# Patient Record
Sex: Female | Born: 1950 | Race: White | Hispanic: No | Marital: Married | Smoking: Never smoker
Health system: Southern US, Community
[De-identification: ages and names within clinical notes are randomized; demographics above are authoritative.]

## PROBLEM LIST (undated history)

## (undated) DIAGNOSIS — M79604 Pain in right leg: Secondary | ICD-10-CM

## (undated) DIAGNOSIS — M545 Low back pain, unspecified: Secondary | ICD-10-CM

## (undated) DIAGNOSIS — M79605 Pain in left leg: Secondary | ICD-10-CM

## (undated) DIAGNOSIS — H269 Unspecified cataract: Secondary | ICD-10-CM

## (undated) DIAGNOSIS — C801 Malignant (primary) neoplasm, unspecified: Secondary | ICD-10-CM

## (undated) DIAGNOSIS — I471 Supraventricular tachycardia, unspecified: Secondary | ICD-10-CM

## (undated) DIAGNOSIS — K219 Gastro-esophageal reflux disease without esophagitis: Secondary | ICD-10-CM

## (undated) DIAGNOSIS — T7840XA Allergy, unspecified, initial encounter: Secondary | ICD-10-CM

## (undated) DIAGNOSIS — M1990 Unspecified osteoarthritis, unspecified site: Secondary | ICD-10-CM

## (undated) DIAGNOSIS — F419 Anxiety disorder, unspecified: Secondary | ICD-10-CM

## (undated) HISTORY — DX: Gastro-esophageal reflux disease without esophagitis: K21.9

## (undated) HISTORY — DX: Unspecified osteoarthritis, unspecified site: M19.90

## (undated) HISTORY — DX: Pain in right leg: M79.604

## (undated) HISTORY — PX: UPPER GASTROINTESTINAL ENDOSCOPY: SHX188

## (undated) HISTORY — PX: COLONOSCOPY: SHX174

## (undated) HISTORY — PX: OTHER SURGICAL HISTORY: SHX169

## (undated) HISTORY — DX: Anxiety disorder, unspecified: F41.9

## (undated) HISTORY — DX: Pain in left leg: M79.605

## (undated) HISTORY — DX: Malignant (primary) neoplasm, unspecified: C80.1

## (undated) HISTORY — DX: Low back pain, unspecified: M54.50

## (undated) HISTORY — DX: Supraventricular tachycardia, unspecified: I47.10

## (undated) HISTORY — DX: Allergy, unspecified, initial encounter: T78.40XA

## (undated) HISTORY — DX: Low back pain: M54.5

## (undated) HISTORY — DX: Unspecified cataract: H26.9

## (undated) HISTORY — DX: Supraventricular tachycardia: I47.1

---

## 1996-11-09 HISTORY — PX: VAGINAL HYSTERECTOMY: SUR661

## 2002-08-07 ENCOUNTER — Encounter: Payer: Self-pay | Admitting: Family Medicine

## 2002-08-07 ENCOUNTER — Encounter: Admission: RE | Admit: 2002-08-07 | Discharge: 2002-08-07 | Payer: Self-pay | Admitting: Family Medicine

## 2006-07-06 ENCOUNTER — Encounter: Admission: RE | Admit: 2006-07-06 | Discharge: 2006-07-06 | Payer: Self-pay | Admitting: Family Medicine

## 2006-07-14 ENCOUNTER — Ambulatory Visit: Payer: Self-pay

## 2006-07-14 ENCOUNTER — Encounter: Payer: Self-pay | Admitting: Cardiology

## 2006-07-23 ENCOUNTER — Ambulatory Visit: Payer: Self-pay | Admitting: Internal Medicine

## 2006-08-05 ENCOUNTER — Ambulatory Visit: Payer: Self-pay | Admitting: Internal Medicine

## 2006-08-05 ENCOUNTER — Encounter: Payer: Self-pay | Admitting: Internal Medicine

## 2008-11-16 ENCOUNTER — Ambulatory Visit: Payer: Self-pay | Admitting: Family Medicine

## 2008-11-16 DIAGNOSIS — J309 Allergic rhinitis, unspecified: Secondary | ICD-10-CM | POA: Insufficient documentation

## 2008-11-16 DIAGNOSIS — F411 Generalized anxiety disorder: Secondary | ICD-10-CM | POA: Insufficient documentation

## 2008-11-19 ENCOUNTER — Emergency Department (HOSPITAL_COMMUNITY): Admission: EM | Admit: 2008-11-19 | Discharge: 2008-11-20 | Payer: Self-pay | Admitting: Emergency Medicine

## 2008-12-21 ENCOUNTER — Ambulatory Visit: Payer: Self-pay | Admitting: Family Medicine

## 2008-12-21 LAB — CONVERTED CEMR LAB
ALT: 26 units/L (ref 0–35)
AST: 22 units/L (ref 0–37)
Albumin: 3.8 g/dL (ref 3.5–5.2)
Alkaline Phosphatase: 46 units/L (ref 39–117)
BUN: 12 mg/dL (ref 6–23)
Basophils Absolute: 0 10*3/uL (ref 0.0–0.1)
Basophils Relative: 0.3 % (ref 0.0–3.0)
Bilirubin Urine: NEGATIVE
Bilirubin, Direct: 0.1 mg/dL (ref 0.0–0.3)
CO2: 31 meq/L (ref 19–32)
Calcium: 9.3 mg/dL (ref 8.4–10.5)
Chloride: 102 meq/L (ref 96–112)
Cholesterol: 219 mg/dL (ref 0–200)
Creatinine, Ser: 0.8 mg/dL (ref 0.4–1.2)
Direct LDL: 122.7 mg/dL
Eosinophils Absolute: 0.2 10*3/uL (ref 0.0–0.7)
Eosinophils Relative: 3.8 % (ref 0.0–5.0)
GFR calc Af Amer: 95 mL/min
GFR calc non Af Amer: 79 mL/min
Glucose, Bld: 107 mg/dL — ABNORMAL HIGH (ref 70–99)
Glucose, Urine, Semiquant: NEGATIVE
HCT: 37.6 % (ref 36.0–46.0)
HDL: 57.3 mg/dL (ref >39.0)
Hemoglobin: 13.1 g/dL (ref 12.0–15.0)
Ketones, urine, test strip: NEGATIVE
Lymphocytes Relative: 49.3 % — ABNORMAL HIGH (ref 12.0–46.0)
MCHC: 34.8 g/dL (ref 30.0–36.0)
MCV: 92.6 fL (ref 78.0–100.0)
Monocytes Absolute: 0.4 10*3/uL (ref 0.1–1.0)
Monocytes Relative: 7.4 % (ref 3.0–12.0)
Neutro Abs: 2.3 10*3/uL (ref 1.4–7.7)
Neutrophils Relative %: 39.2 % — ABNORMAL LOW (ref 43.0–77.0)
Nitrite: NEGATIVE
Platelets: 282 10*3/uL (ref 150–400)
Potassium: 4.1 meq/L (ref 3.5–5.1)
RBC: 4.06 M/uL (ref 3.87–5.11)
RDW: 12 % (ref 11.5–14.6)
Sodium: 140 meq/L (ref 135–145)
Specific Gravity, Urine: 1.015
TSH: 1.19 microintl units/mL (ref 0.35–5.50)
Total Bilirubin: 0.9 mg/dL (ref 0.3–1.2)
Total CHOL/HDL Ratio: 3.8
Total Protein: 6.9 g/dL (ref 6.0–8.3)
Triglycerides: 205 mg/dL (ref 0–149)
Urobilinogen, UA: 0.2
VLDL: 41 mg/dL — ABNORMAL HIGH (ref 0–40)
WBC Urine, dipstick: NEGATIVE
WBC: 5.9 10*3/uL (ref 4.5–10.5)
pH: 8

## 2008-12-28 ENCOUNTER — Ambulatory Visit: Payer: Self-pay | Admitting: Family Medicine

## 2009-02-26 ENCOUNTER — Ambulatory Visit: Payer: Self-pay | Admitting: Family Medicine

## 2009-03-07 ENCOUNTER — Telehealth: Payer: Self-pay | Admitting: Family Medicine

## 2009-03-22 ENCOUNTER — Telehealth: Payer: Self-pay | Admitting: Speech Pathology

## 2009-06-06 ENCOUNTER — Telehealth: Payer: Self-pay | Admitting: Internal Medicine

## 2009-06-17 ENCOUNTER — Ambulatory Visit: Payer: Self-pay | Admitting: Gastroenterology

## 2009-06-17 ENCOUNTER — Telehealth: Payer: Self-pay | Admitting: Internal Medicine

## 2009-06-17 DIAGNOSIS — R1319 Other dysphagia: Secondary | ICD-10-CM | POA: Insufficient documentation

## 2009-06-18 ENCOUNTER — Ambulatory Visit: Payer: Self-pay | Admitting: Gastroenterology

## 2009-06-20 ENCOUNTER — Telehealth: Payer: Self-pay | Admitting: Internal Medicine

## 2009-06-21 ENCOUNTER — Ambulatory Visit: Payer: Self-pay | Admitting: Family Medicine

## 2009-06-21 ENCOUNTER — Ambulatory Visit: Payer: Self-pay | Admitting: Gastroenterology

## 2009-06-25 ENCOUNTER — Ambulatory Visit (HOSPITAL_COMMUNITY): Admission: RE | Admit: 2009-06-25 | Discharge: 2009-06-25 | Payer: Self-pay | Admitting: Gastroenterology

## 2009-07-01 ENCOUNTER — Telehealth: Payer: Self-pay | Admitting: Internal Medicine

## 2009-07-02 ENCOUNTER — Encounter: Payer: Self-pay | Admitting: Internal Medicine

## 2009-07-19 ENCOUNTER — Ambulatory Visit: Payer: Self-pay | Admitting: Internal Medicine

## 2009-07-19 DIAGNOSIS — F410 Panic disorder [episodic paroxysmal anxiety] without agoraphobia: Secondary | ICD-10-CM | POA: Insufficient documentation

## 2009-07-29 ENCOUNTER — Telehealth: Payer: Self-pay | Admitting: Family Medicine

## 2009-08-02 ENCOUNTER — Telehealth: Payer: Self-pay | Admitting: Internal Medicine

## 2009-08-08 ENCOUNTER — Telehealth: Payer: Self-pay | Admitting: Internal Medicine

## 2009-08-09 ENCOUNTER — Telehealth: Payer: Self-pay | Admitting: Internal Medicine

## 2009-08-09 ENCOUNTER — Emergency Department (HOSPITAL_COMMUNITY): Admission: EM | Admit: 2009-08-09 | Discharge: 2009-08-09 | Payer: Self-pay | Admitting: Emergency Medicine

## 2009-08-09 ENCOUNTER — Telehealth: Payer: Self-pay | Admitting: Family Medicine

## 2009-08-12 ENCOUNTER — Telehealth: Payer: Self-pay | Admitting: Internal Medicine

## 2009-09-04 ENCOUNTER — Encounter: Payer: Self-pay | Admitting: Family Medicine

## 2010-01-09 ENCOUNTER — Ambulatory Visit: Payer: Self-pay | Admitting: Family Medicine

## 2010-01-09 DIAGNOSIS — M542 Cervicalgia: Secondary | ICD-10-CM | POA: Insufficient documentation

## 2010-01-13 ENCOUNTER — Encounter: Payer: Self-pay | Admitting: Family Medicine

## 2010-01-13 ENCOUNTER — Ambulatory Visit: Payer: Self-pay | Admitting: Internal Medicine

## 2010-01-14 ENCOUNTER — Ambulatory Visit: Payer: Self-pay | Admitting: Family Medicine

## 2010-01-14 LAB — CONVERTED CEMR LAB
Bilirubin Urine: NEGATIVE
Glucose, Urine, Semiquant: NEGATIVE
Ketones, urine, test strip: NEGATIVE
Nitrite: NEGATIVE
Protein, U semiquant: NEGATIVE
Specific Gravity, Urine: <1.005
Urobilinogen, UA: 0.2
WBC Urine, dipstick: NEGATIVE
pH: 6.5

## 2010-01-21 ENCOUNTER — Telehealth: Payer: Self-pay | Admitting: Family Medicine

## 2010-09-08 ENCOUNTER — Encounter: Payer: Self-pay | Admitting: Family Medicine

## 2010-12-07 LAB — CONVERTED CEMR LAB
ALT: 23 units/L (ref 0–35)
AST: 22 units/L (ref 0–37)
Albumin: 4.1 g/dL (ref 3.5–5.2)
Alkaline Phosphatase: 54 units/L (ref 39–117)
BUN: 11 mg/dL (ref 6–23)
Basophils Absolute: 0.1 10*3/uL (ref 0.0–0.1)
Basophils Relative: 1.1 % (ref 0.0–3.0)
Bilirubin Urine: NEGATIVE
Bilirubin, Direct: 0.1 mg/dL (ref 0.0–0.3)
CO2: 31 meq/L (ref 19–32)
Calcium: 9.3 mg/dL (ref 8.4–10.5)
Chloride: 103 meq/L (ref 96–112)
Cholesterol: 235 mg/dL — ABNORMAL HIGH (ref 0–200)
Creatinine, Ser: 0.9 mg/dL (ref 0.4–1.2)
Direct LDL: 130.2 mg/dL
Eosinophils Absolute: 0.1 10*3/uL (ref 0.0–0.7)
Eosinophils Relative: 0.9 % (ref 0.0–5.0)
GFR calc non Af Amer: 68.57 mL/min (ref >60)
Glucose, Bld: 113 mg/dL — ABNORMAL HIGH (ref 70–99)
Glucose, Urine, Semiquant: NEGATIVE
HCT: 40.2 % (ref 36.0–46.0)
HDL: 91.5 mg/dL (ref >39.00)
Hemoglobin: 13.2 g/dL (ref 12.0–15.0)
Ketones, urine, test strip: NEGATIVE
Lymphocytes Relative: 22.8 % (ref 12.0–46.0)
Lymphs Abs: 1.6 10*3/uL (ref 0.7–4.0)
MCHC: 32.9 g/dL (ref 30.0–36.0)
MCV: 96.7 fL (ref 78.0–100.0)
Monocytes Absolute: 0.5 10*3/uL (ref 0.1–1.0)
Monocytes Relative: 6.6 % (ref 3.0–12.0)
Neutro Abs: 4.6 10*3/uL (ref 1.4–7.7)
Neutrophils Relative %: 68.6 % (ref 43.0–77.0)
Nitrite: NEGATIVE
Platelets: 325 10*3/uL (ref 150.0–400.0)
Potassium: 3.5 meq/L (ref 3.5–5.1)
Protein, U semiquant: NEGATIVE
RBC: 4.16 M/uL (ref 3.87–5.11)
RDW: 12.6 % (ref 11.5–14.6)
Sodium: 141 meq/L (ref 135–145)
Specific Gravity, Urine: 1.02
TSH: 1.37 microintl units/mL (ref 0.35–5.50)
Total Bilirubin: 0.5 mg/dL (ref 0.3–1.2)
Total CHOL/HDL Ratio: 3
Total Protein: 7.6 g/dL (ref 6.0–8.3)
Triglycerides: 148 mg/dL (ref 0.0–149.0)
Urobilinogen, UA: 0.2
VLDL: 29.6 mg/dL (ref 0.0–40.0)
WBC Urine, dipstick: NEGATIVE
WBC: 6.9 10*3/uL (ref 4.5–10.5)
pH: 6.5

## 2010-12-09 NOTE — Miscellaneous (Signed)
Summary: BONE DENSITY  Clinical Lists Changes  Orders: Added new Test order of T-Bone Densitometry (77080) - Signed Added new Test order of T-Lumbar Vertebral Assessment (77082) - Signed 

## 2010-12-09 NOTE — Miscellaneous (Signed)
Summary: Manometry cancelled and Rx  Saw her at hospital and given improvement will hold off on manometry.  Will continue alprazolam, use pantoprazole.  She has REV in SeptemberPrescriptions: Prescriptions: ALPRAZOLAM 0.25 MG TABS (ALPRAZOLAM) Take 1 tab 3 times daily as needed  #60 x 1   Entered and Authorized by:   Iva Boop MD, [facility]   Signed by:   Iva Boop MD, [facility] on 07/02/2009   Method used:   Print then Give to Patient   RxID:   [id number] PANTOPRAZOLE SODIUM 40 MG  TBEC (PANTOPRAZOLE SODIUM) 1 each day 30 minutes before meal  #30 x 1   Entered and Authorized by:   Iva Boop MD, [facility]   Signed by:   Iva Boop MD, [facility] on 07/02/2009   Method used:   Print then Give to Patient   RxID:   [id number]

## 2010-12-09 NOTE — Assessment & Plan Note (Signed)
Summary: cpx/pt coming in fasting/cjr   Vital Signs:  Patient profile:   59 year old female Menstrual status:  hysterectomy Height:      66 inches Weight:      160 pounds Temp:     98.9 degrees F oral  Vitals Entered By: Kern Reap CMA Duncan Dull) (January 09, 2010 9:28 AM)  Reason for Visit cpx  Primary Care Provider:  Kelle Darting, MD   History of Present Illness: Ellen Collins is a 60 year old, married female, nonsmoker, who comes in today for a physical evaluation.  She takes hormone replacement therapy via her GYN.  She is aware of the long-term risks but wishes to continue.  I will leave that up to her GYN.  She does have a history of allergic rhinitis, for which he takes Claritin, 10 mg daily Astelin nasal spray, and steroid nasal spray.  She would like refills.  For sleep dysfunction.  She takes 5 mg of Ambien nightly p.r.n.  Last year.  She had a lot of trouble with spells of anxiety.  GI workup negative.  Psychiatric consult with Dr. Nolen Mu revealed panic attacks.  She's been on for anti-depressants long-term and does not feel that they helped and she also has side effects.  Dr. Nolen Mu has decided to treat her episodically with Xanax .25 p.r.n.  This seems to be working well.  A new problem is neck pain.  Over the last couple, months.  She's been having neck pain.  She, states it's dull sometimes sharp it comes and goes.  She denies any neurologic symptoms.  She was having severe discomfort and therefore, went to an urgent care center.  She was sent for an MRI, which showed no evidence of a surgical intervention.  Despite that, they sent her for neurosurgery consult.  I don't feel like she needs it at this time.  We will treat her symptomatically.  She gets routine eye care.  Dental care.  Monthly BSE, annual mammography.  Colonoscopy, normal, and GI tetanus 2007, seasonal flu 2010.  She's had a TAH and BSO for nonmalignant reasons.  Mammogram October normal.  Last BMD 5 years  ago.  Allergies: 1)  ! * Latex  Past History:  Past medical, surgical, family and social histories (including risk factors) reviewed, and no changes noted (except as noted below).  Past Medical History: Reviewed history from 07/19/2009 and no changes required. Allergic rhinitis Anxiety/Panic disorder (dysphagia)  Past Surgical History: Reviewed history from 11/16/2008 and no changes required. Hysterectomy Oophorectomy  nonmalignant  Family History: Reviewed history from 11/16/2008 and no changes required. Father: deceased - colon cancer Mother: deceased - ovarian cancer Siblings: brother - healthy  Social History: Reviewed history from 06/17/2009 and no changes required. Occupation:stay at home Married  No children Regular exercise-yes Patient has never smoked.  Alcohol Use - yes   2 per week Illicit Drug Use - no  Review of Systems      See HPI  Physical Exam  General:  Well-developed,well-nourished,in no acute distress; alert,appropriate and cooperative throughout examination Head:  Normocephalic and atraumatic without obvious abnormalities. No apparent alopecia or balding. Eyes:  No corneal or conjunctival inflammation noted. EOMI. Perrla. Funduscopic exam benign, without hemorrhages, exudates or papilledema. Vision grossly normal. Ears:  External ear exam shows no significant lesions or deformities.  Otoscopic examination reveals clear canals, tympanic membranes are intact bilaterally without bulging, retraction, inflammation or discharge. Hearing is grossly normal bilaterally. Nose:  External nasal examination shows no deformity or inflammation.  Nasal mucosa are pink and moist without lesions or exudates. Mouth:  Oral mucosa and oropharynx without lesions or exudates.  Teeth in good repair. Neck:  No deformities, masses, or tenderness noted. Chest Wall:  No deformities, masses, or tenderness noted. Breasts:  No mass, nodules, thickening, tenderness, bulging,  retraction, inflamation, nipple discharge or skin changes noted.   Lungs:  Normal respiratory effort, chest expands symmetrically. Lungs are clear to auscultation, no crackles or wheezes. Heart:  Normal rate and regular rhythm. S1 and S2 normal without gallop, murmur, click, rub or other extra sounds. Abdomen:  Bowel sounds positive,abdomen soft and non-tender without masses, organomegaly or hernias noted. Rectal:  gyn  Genitalia:  gyn Msk:  No deformity or scoliosis noted of thoracic or lumbar spine.   Pulses:  R and L carotid,radial,femoral,dorsalis pedis and posterior tibial pulses are full and equal bilaterally Extremities:  No clubbing, cyanosis, edema, or deformity noted with normal full range of motion of all joints.   Neurologic:  No cranial nerve deficits noted. Station and gait are normal. Plantar reflexes are down-going bilaterally. DTRs are symmetrical throughout. Sensory, motor and coordinative functions appear intact. Skin:  Intact without suspicious lesions or rashes Cervical Nodes:  No lymphadenopathy noted Axillary Nodes:  No palpable lymphadenopathy Inguinal Nodes:  No significant adenopathy Psych:  Cognition and judgment appear intact. Alert and cooperative with normal attention span and concentration. 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 1/2 tab daily x 1 week then 1 tab daily Her updated medication list for this problem includes:    Alprazolam 0.25 Mg Tabs (Alprazolam) .Marland Kitchen... Take 1 tab 3 times daily as needed    Amitriptyline Hcl 25 Mg Tabs (Amitriptyline hcl) .Marland Kitchen... 1 tab @ bedtime  Orders: Venipuncture (09811) TLB-Lipid Panel (80061-LIPID) TLB-BMP (Basic Metabolic Panel-BMET) (80048-METABOL) TLB-CBC Platelet - w/Differential  (85025-CBCD) TLB-Hepatic/Liver Function Pnl (80076-HEPATIC) TLB-TSH (Thyroid Stimulating Hormone) (91478-GNF) Prescription Created Electronically [phone]) EKG w/ Interpretation (93000) UA Dipstick w/o Micro (automated)  (81003)  Problem # 2:  ALLERGIC RHINITIS (ICD-477.9) Assessment: Improved  The following medications were removed from the medication list:    Astelin 137 Mcg/spray Soln (Azelastine hcl) .Marland Kitchen... As needed    Nasonex 50 Mcg/act Susp (Mometasone furoate) .Marland Kitchen... As needed    Promethazine Hcl 25 Mg Supp (Promethazine hcl) .Marland Kitchen..Marland Kitchen 1 rectally q 8 hr. as needed nausea Her updated medication list for this problem includes:    Alavert 10 Mg Tabs (Loratadine) ..... Once daily    Astelin 137 Mcg/spray Soln (Azelastine hcl) ..... Uad    Flonase 50 Mcg/act Susp (Fluticasone propionate) ..... Uad  Orders: Venipuncture (86578) TLB-Lipid Panel (80061-LIPID) TLB-BMP (Basic Metabolic Panel-BMET) (80048-METABOL) TLB-CBC Platelet - w/Differential (85025-CBCD) TLB-Hepatic/Liver Function Pnl (80076-HEPATIC) TLB-TSH (Thyroid Stimulating Hormone) (46962-XBM) Prescription Created Electronically [phone]) UA Dipstick w/o Micro (automated)  (81003)  Problem # 3:  Preventive Health Care (ICD-V70.0) Assessment: Unchanged  Problem # 4:  NECK PAIN (ICD-723.1) Assessment: New  Her updated medication list for this problem includes:    Aspirin 81 Mg Tbec (Aspirin) .Marland Kitchen... Take one by mouth twice a week    Adult Aspirin Ec Low Strength 81 Mg Tbec (Aspirin) ..... Once twice a wk  Orders: Venipuncture (44010) TLB-Lipid Panel (80061-LIPID) TLB-BMP (Basic Metabolic Panel-BMET) (80048-METABOL) TLB-CBC Platelet - w/Differential (85025-CBCD) TLB-Hepatic/Liver Function Pnl (80076-HEPATIC) TLB-TSH (Thyroid Stimulating  Hormone) (84443-TSH) UA Dipstick w/o Micro (automated)  (81003) Physical Therapy Referral (PT)  Complete Medication List: 1)  Estrace 2 Mg Tabs (Estradiol) .... Take one tab every other day  and 1/2 tablet by mouth every other day 2)  Alavert 10 Mg Tabs (Loratadine) .... Once daily 3)  Ambien 5 Mg Tabs (Zolpidem tartrate) .... Take 1/2 tablet by mouth at bedtime 4)  Viactiv Multi-vitamin Chew (Multiple vitamins-calcium) .... Chew one by mouth two times a day 5)  Pantoprazole Sodium 40 Mg Tbec (Pantoprazole sodium) .Marland Kitchen.. 1 each day 30 minutes before meal 6)  Alprazolam 0.25 Mg Tabs (Alprazolam) .... Take 1 tab 3 times daily as needed 7)  Aspirin 81 Mg Tbec (Aspirin) .... Take one by mouth twice a week 8)  Vitamin D 1000 Unit Tabs (Cholecalciferol) .... Once a day 9)  Adult Aspirin Ec Low Strength 81 Mg Tbec (Aspirin) .... Once twice a wk 10)  Mega Red  .... Once daily 11)  Amitriptyline Hcl 25 Mg Tabs (Amitriptyline hcl) .Marland Kitchen.. 1 tab @ bedtime 12)  Astelin 137 Mcg/spray Soln (Azelastine hcl) .... Uad 13)  Flonase 50 Mcg/act Susp (Fluticasone propionate) .... Uad  Other Orders: T-Bone Densitometry [phone])  Patient Instructions: 1)  take Motrin, 600 mg twice a day with food, wear a soft cervical collar, nightly, and we will be to set up for physical therapy.  I would cancel the neurosurgical appointment. 2)  Please schedule a follow-up appointment in 1 year. 3)  Schedule your mammogram. 4)  Schedule a colonoscopy/sigmoidoscopy to help detect colon cancer. 5)  Take an Aspirin every day. Prescriptions: FLONASE 50 MCG/ACT SUSP (FLUTICASONE PROPIONATE) UAD  #3 x 4   Entered and Authorized by:   Roderick Pee MD   Signed by:   Roderick Pee MD on 01/09/2010   Method used:   Electronically to        [facility]  [phone]* (retail)       [address] [place], [place]  [address]       Ph: [phone] or [phone]       Fax: [phone]   RxID:   [id number] ASTELIN 137 MCG/SPRAY SOLN (AZELASTINE HCL) UAD  #3 x 4   Entered and Authorized by:   Roderick Pee MD   Signed by:   Roderick Pee MD on 01/09/2010   Method used:   Print then Give to Patient   RxID:    [id number] ALPRAZOLAM 0.25 MG TABS (ALPRAZOLAM) Take 1 tab 3 times daily as needed  #60 x 3   Entered and Authorized by:   Roderick Pee MD   Signed by:   Roderick Pee MD on 01/09/2010   Method used:   Print then Give to Patient   RxID:   [id number] AMBIEN 5 MG TABS (ZOLPIDEM TARTRATE) take 1/2 tablet by mouth at bedtime  #30 x 2   Entered and Authorized by:   Roderick Pee MD   Signed by:   Roderick Pee MD on 01/09/2010   Method used:   Print then Give to Patient   RxID:   [id number] AMITRIPTYLINE HCL 25 MG TABS (AMITRIPTYLINE HCL) 1 tab @ bedtime  #100 x 3   Entered and Authorized by:   Roderick Pee MD   Signed by:   Roderick Pee MD on 01/09/2010   Method used:   [facility]        [facility]  [address]  [phone]* (retail)       [address] [place], [place]  [address]       Ph: [phone] or [phone]       Fax: [phone]   RxID:   [id number]     Laboratory Results   Urine Tests    Routine Urinalysis   Color: yellow Appearance: Clear Glucose: negative   (Normal Range: Negative) Bilirubin: negative   (Normal Range: Negative) Ketone: negative   (Normal Range: Negative) Spec. Gravity: 1.020   (Normal Range: 1.003-1.035) Blood: 1+   (Normal Range: Negative) pH: 6.5   (Normal Range: 5.0-8.0) Protein: negative   (Normal Range: Negative) Urobilinogen: 0.2   (Normal Range: 0-1) Nitrite: negative   (Normal Range: Negative) Leukocyte Esterace: negative   (Normal Range: Negative)    Comments: Rita Ohara  January 09, 2010 2:16 PM

## 2010-12-09 NOTE — Assessment & Plan Note (Signed)
Summary: DYSPHAGIA, PANIC D/O   History of Present Illness Visit Type: Follow-up Visit Primary GI MD: Stan Head MD [facility] Primary Provider: Kelle Darting, MD Chief Complaint: Follow-up visit from office visit with Amy, patient states that she is 85% better. Medication is working great and she is a different person. If she feels she is going to have a problem she takes a Alprazolam. History of Present Illness:   "I feel 100% better, the medicine is working. Occasional tightening of throat but able to eat out again. Using alprazolam 1-3/day. She tells how it has been a struggle to try to stop estradiol therapy. Multiple hot flashes, alopecia, irritability. Buproprion had been for that. She has been on it since a prohylacticv hysterectomy/BSO years ago.           Current Medications (verified): 1)  Estrace 2 Mg Tabs (Estradiol) .... Take One Tab Every Other Day and 1/2 Tablet By Mouth Every Other Day 2)  Alavert 10 Mg Tabs (Loratadine) .... Once Daily 3)  Ambien 5 Mg Tabs (Zolpidem Tartrate) .... Take 1/2 Tablet By Mouth At Bedtime 4)  Viactiv Multi-Vitamin  Chew (Multiple Vitamins-Calcium) .... Chew One By Mouth Two Times A Day 5)  Pantoprazole Sodium 40 Mg  Tbec (Pantoprazole Sodium) .Marland Kitchen.. 1 Each Day 30 Minutes Before Meal 6)  Astelin 137 Mcg/spray Soln (Azelastine Hcl) .... As Needed 7)  Nasonex 50 Mcg/act Susp (Mometasone Furoate) .... As Needed 8)  Alprazolam 0.25 Mg Tabs (Alprazolam) .... Take 1 Tab 3 Times Daily As Needed 9)  Aspirin 81 Mg Tbec (Aspirin) .... Take One By Mouth Twice A Week  Allergies (verified): 1)  ! * Latex  Past History:  Past Surgical History: Last updated: 11/16/2008 Hysterectomy Oophorectomy  nonmalignant  Past Medical History: Allergic rhinitis Anxiety/Panic disorder (dysphagia)  Family History: Reviewed history from 11/16/2008 and no changes required. Father: deceased - colon cancer Mother: deceased - ovarian cancer Siblings: brother -  healthy  Social History: Reviewed history from 06/17/2009 and no changes required. Occupation:stay at home Married  No children Regular exercise-yes Patient has never smoked.  Alcohol Use - yes   2 per week Illicit Drug Use - no  Vital Signs:  Patient profile:   60 year old female Height:      62.25 inches Weight:      158.0 pounds Pulse rate:   100 / minute Pulse rhythm:   regular BP sitting:   130 / 78  (left arm) Cuff size:   regular  Vitals Entered By: Harlow Mares CMA Duncan Dull) (July 19, 2009 11:49 AM)  Physical Exam  General:  Well developed, well nourished, no acute distress.   Impression & Recommendations:  Problem # 1:  DYSPHAGIA (LKG-401.02) Assessment Improved This has responded to pantoprazole and alprazolam (mostly the latter). I believe it is related to panic disorder. See that dx.  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  Approx 15-20 mins spent discussing this and treatment that could include psychotherapy and/or medication. ? if some relationship to attempts in withdrawing estradiol. Will start citalopram, continue alprazolam and follow-up in 2 months. Goal would be to minimize alprazolam use if possible. Citalopram may help estradiol withdrawal also. Side effects discussed. Anxiety/panic handout provided.  Patient Instructions: 1)  Please pick up your medications at your pharmacy. 2)  You will start citalopram for panic disturbance. 3)  Your pantoprazole and alprazolam have been refilled. 4)  Please schedule a follow-up appointment in 2 months.  5)  The medication list was reviewed and reconciled.  All changed / newly prescribed medications were explained.  A complete medication list was provided to the patient / caregiver. Prescriptions: ALPRAZOLAM 0.25 MG TABS (ALPRAZOLAM) Take 1 tab 3 times daily as needed  #60 x 3   Entered and  Authorized by:   Iva Boop MD, [facility]   Signed by:   Iva Boop MD, FACG on 07/19/2009   Method used:   Printed then faxed to ...       [facility]  [phone]* (retail)       [address] [place], [place]  [address]       Ph: [phone] or [phone]       Fax: [phone]   RxID:   [id number] PANTOPRAZOLE SODIUM 40 MG  TBEC (PANTOPRAZOLE SODIUM) 1 each day 30 minutes before meal  #30 x 11   Entered and Authorized by:   Iva Boop MD, [facility]   Signed by:   Iva Boop MD, [facility] on 07/19/2009   Method used:   [facility]ron[facility] to        [facility]  [phone]* (retail)       [address] [place], [place]  [address]       Ph: [phone] or [phone]       Fax: [phone]   RxID:   [id number] CITALOPRAM HYDROBROMIDE 20 MG TABS (CITALOPRAM HYDROBROMIDE) 1/2 tab daily x 1 week then 1 tab daily  #30 x 3   Entered and Authorized by:   Iva Boop MD, [facility]   Signed by:   Iva Boop MD, FACG on 07/19/2009   Method used:   [facility]        [facility]  [phone]* (retail)       [address] [place], [place]  [address]       Ph: [phone] or [phone]       Fax: [phone]   RxID:   [id number]

## 2010-12-09 NOTE — Procedures (Signed)
Summary: Colonoscopy and Path Report:: NORMAL (no adenomas)  Patient Name: Ellen Collins, Ellen Collins MRN:  Procedure Procedures: Colonoscopy CPT: [phone].    with polypectomy. CPT: [id number].  Personnel: Endoscopist: Iva Boop, MD, [facility].  Referred By: Mosetta Putt, M.D.  Exam Location: Exam performed in Outpatient Clinic. Outpatient  Patient Consent: Procedure, Alternatives, Risks and Benefits discussed, consent obtained, from patient. Consent was obtained by the RN.  Indications  Increased Risk Screening: For family history of colorectal neoplasia, in  parent age at onset: 74's.  History  Current Medications: Patient is not currently taking Coumadin.  Allergies: No known allergies. Allergic to LATEX.  Pre-Exam Physical: Performed Aug 05, 2006. Cardio-pulmonary exam, Rectal exam, HEENT exam , Abdominal exam, Mental status exam WNL.  Comments: Pt. history reviewed/updated, physical exam performed prior to initiation of sedation? YES Exam Exam: Extent of exam reached: Cecum, extent intended: Cecum.  Time to Cecum: 00:07:28. Time for Withdrawl: 00:09:58. ASA Classification: I. Tolerance: good.  Monitoring: Pulse and BP monitoring, Oximetry used. Supplemental O2 given.  Colon Prep Used MiraLax for colon prep. Prep results: fair, adequate exam.  Sedation Meds: Patient assessed and found to be appropriate for moderate (conscious) sedation. Fentanyl 75 mcg. given IV. Versed 8 mg. given IV.  Comments: Most of prep good but some mucoid stool retention in ascending colon limited exam and reduced ability to see lesions <5 mm there Findings - NORMAL EXAM: Cecum to Sigmoid Colon.  POLYP: Sigmoid Colon, Maximum size: 7 mm. sessile polyp. Distance from Anus 20 cm. Procedure:  snare with cautery, removed, retrieved, Polyp sent to pathology. 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 Comments: removed during insertion.  NORMAL EXAM: Rectum.   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   Comments: 1) 7 MM DISTAL SIGMOID POLYP REMOVED HYPERPLASTIC 2) OTHERWISE NORMAL  3) FAIR, ADEQUATE PREP (MOSTLY GOOD EXCEPT ASCENDING COLON) 4) 9 MIN 58 SEC WITHDRAWAL INSPECTION TIME Events  Unplanned Interventions: No intervention was required.  Plans  Post Exam Instructions: No aspirin or non-steroidal containing medications: 2 WEEKS.  Patient Education: Patient given standard instructions for: Polyps.  Disposition: After procedure patient sent to recovery. After recovery patient sent home.  Scheduling/Referral: Colonoscopy, to Iva Boop, MD, [facility] YRS,  Primary Care Provider, to Mosetta Putt, M.D.,    [facility] Surgical Pathology - STATUS: Final                                            Perform Date: 27Sep07 00:01  Ordered By: Jacqualine Code Date: 27Sep07 23:23  Facility: [facility]                               Department: CPATH  Service Report Text  [facility]   [address] [address]   [place], [place] [address]   Telephone [phone] or [phone] Fax [phone]    REPORT OF SURGICAL PATHOLOGY    Case #: [id number]   Patient Name: Ellen Collins, Ellen Collins.   Office Chart Number: [id number]    MRN: [id number]   Pathologist: Arvid Right, MD   DOB/Age Dec 28, 1950 (Age: 60) Gender: F   Date Taken: 08/05/2006   Date Received: 08/05/2006    FINAL DIAGNOSIS    ***MICROSCOPIC  EXAMINATION AND DIAGNOSIS***    SIGMOID COLON POLYP, 7 MM, ENDOSCOPIC BIOPSY:   - HYPERPLASTIC POLYP.   - NO ADENOMATOUS MUCOSA IDENTIFIED.    jy   Date Reported: 08/06/2006 Crystal Moore-Maxwell, MD   *** Electronically Signed Out By [facility] ***    Clinical information   Screening FX HX colon cancer [facility] Screening Study (jes)    specimen(s) obtained   Colon, polyp(s), sigmoid 7 mm    Gross Description   Received in formalin is a tan, soft tissue fragment that is   submitted in toto. Size: 0.6 cm One block    (MA:jes, 08/06/06)    jes/

## 2010-12-09 NOTE — Progress Notes (Signed)
Summary: triage  Phone Note Call from Patient Call back at Home Phone [phone]   Summary of Call: Patient states that she woke up so sick, states that she is sweating, upset stomach, severe nausea and has a burning pain right on her breast bone wants to nkow what to do Initial call taken by: Tawni Levy,  August 09, 2009 10:16 AM  Follow-up for Phone Call        Patient with constant nausea.  She c/ochest pain, blurred vision, nausea, arm pain/tingling, sweating.  Patient states that she just doesn't feel well, feels bad all the time..  Stopping her protonix has helped with her abdominal pain.  She says that all of her symptoms are listed as side effects from celexa.  I have advised patient she needs to be seen in the ER asap.  Due to the above symptoms, she verblaized understanding. Follow-up by: Darcey Nora RN, CGRN,  August 09, 2009 11:18 AM  Additional Follow-up for Phone Call Additional follow up Details #1::        Stop Celexa also Follow-up with her later today or Mon 10/4. Additional Follow-up by: Iva Boop MD, [facility],  August 09, 2009 11:57 AM    Additional Follow-up for Phone Call Additional follow up Details #2::    I have left her a message to call me on Monday with an update and to stop the celexa Follow-up by: Darcey Nora RN, CGRN,  August 09, 2009 1:32 PM

## 2010-12-09 NOTE — Progress Notes (Signed)
Summary: ? meds  Phone Note Call from Patient Call back at Home Phone [phone]   Caller: Patient Call For: Sheila Gervasi  Reason for Call: Talk to Nurse Summary of Call: manommetry sch tomorrow . pt took a Nexium yesterday needs to know what she needs to do since instructions said NOT to take any Nexium Initial call taken by: Guadlupe Spanish [facility],  July 01, 2009 9:52 AM  Follow-up for Phone Call        Patient advised that she should  proceed with the [facility] tomorrow Follow-up by: Darcey Nora RN, CGRN,  July 01, 2009 10:17 AM

## 2010-12-09 NOTE — Assessment & Plan Note (Signed)
Summary: NEW ACUTE THROAT CLOSES UP AND CANT BREATH/MHF   Vital Signs:  Patient Profile:   60 Years Old Female Height:     57.5 inches Weight:      166 pounds Temp:     98.6 degrees F oral BP sitting:   150 / 98  (left arm) Cuff size:   regular  Vitals Entered By: Kern Reap CMA (November 16, 2008 11:17 AM)                 Chief Complaint:  new patient.  History of Present Illness: Ellen Collins is a 60 year old female, who comes in today to get established as a new patient.  Her current concerns are her blood pressure.  It's 150/98.  She states her blood pressure at home has always been normal.  She takes Wellbutrin 150 mg daily for mild anxiety.  Recently, her symptoms have gotten worse because she was on jury duty.  She states she is feeling a sensation of choking in her chest.  She's also had a lot of burping, but no symptoms of gallbladder disease.  She also takes Astelin nasal spray Nasonex nasal spray and OTC Claritin for allergic rhinitis.  She states she stated her back to be re-skin tested.  I explained I do not think that was having much value.  Her last physical exam was two years ago.  she is a transfer from Dr. Madaline Brilliant office.  I explained to her that we do things a little differently, you.  We see her yearly, not every two years, and we provide comprehensive physical exams.  She can also get her pelvic exams.  Her allergy medicine.  Everything done here at once.    Updated Prior Medication List: ESTRACE 2 MG TABS (ESTRADIOL) take one tab once daily BUDEPRION SR 150 MG XR12H-TAB (BUPROPION HCL) take one tablet once daily ALAVERT 10 MG TABS (LORATADINE) once daily CLARITIN-D 12 HOUR 5-120 MG XR12H-TAB (LORATADINE-PSEUDOEPHEDRINE) once daily ASTELIN 137 MCG/SPRAY SOLN (AZELASTINE HCL) use as directed NASONEX 50 MCG/ACT SUSP (MOMETASONE FUROATE) use as directed  Current Allergies: No known allergies   Past Medical History:    Reviewed history and no changes required:        Allergic rhinitis       Anxiety  Past Surgical History:    Reviewed history and no changes required:       Hysterectomy       Oophorectomy  nonmalignant   Family History:    Reviewed history and no changes required:       Father: deceased - colon cancer       Mother: deceased - ovarian cancer       Siblings: brother - healthy  Social History:    Occupation:stay at home    Married    Regular exercise-yes    Ethnicity:  White   Risk Factors:  Exercise:  yes   Review of Systems      See HPI   Physical Exam  General:     Well-developed,well-nourished,in no acute distress; alert,appropriate and cooperative throughout examination Head:     Normocephalic and atraumatic without obvious abnormalities. No apparent alopecia or balding. Eyes:     No corneal or conjunctival inflammation noted. EOMI. Perrla. Funduscopic exam benign, without hemorrhages, exudates or papilledema. Vision grossly normal. Ears:     External ear exam shows no significant lesions or deformities.  Otoscopic examination reveals clear canals, tympanic membranes are intact bilaterally without bulging, retraction, inflammation or discharge. Hearing  is grossly normal bilaterally. Nose:     External nasal examination shows no deformity or inflammation. Nasal mucosa are pink and moist without lesions or exudates. Mouth:     Oral mucosa and oropharynx without lesions or exudates.  Teeth in good repair. Neck:     No deformities, masses, or tenderness noted. Chest Wall:     No deformities, masses, or tenderness noted. Lungs:     Normal respiratory effort, chest expands symmetrically. Lungs are clear to auscultation, no crackles or wheezes. Heart:     Normal rate and regular rhythm. S1 and S2 normal without gallop, murmur, click, rub or other extra sounds. Abdomen:     Bowel sounds positive,abdomen soft and non-tender without masses, organomegaly or hernias noted.    Impression & Recommendations:   Problem # 1:  ANXIETY (ICD-300.00) Assessment: Deteriorated  Her updated medication list for this problem includes:    Budeprion Sr 150 Mg Xr12h-tab (Bupropion hcl) .Marland Kitchen... Take one tablet once daily   Problem # 2:  ALLERGIC RHINITIS (ICD-477.9) Assessment: Unchanged  Her updated medication list for this problem includes:    Alavert 10 Mg Tabs (Loratadine) ..... Once daily    Astelin 137 Mcg/spray Soln (Azelastine hcl) ..... Use as directed    Nasonex 50 Mcg/act Susp (Mometasone furoate) ..... Use as directed   Complete Medication List: 1)  Estrace 2 Mg Tabs (Estradiol) .... Take one tab once daily 2)  Budeprion Sr 150 Mg Xr12h-tab (Bupropion hcl) .... Take one tablet once daily 3)  Alavert 10 Mg Tabs (Loratadine) .... Once daily 4)  Claritin-d 12 Hour 5-120 Mg Xr12h-tab (Loratadine-pseudoephedrine) .... Once daily 5)  Astelin 137 Mcg/spray Soln (Azelastine hcl) .... Use as directed 6)  Nasonex 50 Mcg/act Susp (Mometasone furoate) .... Use as directed 7)  Ambien 10 Mg Tabs (Zolpidem tartrate) .Marland Kitchen.. 1 tab @ bedtime  as needed   Patient Instructions: 1)  CBC, Bmet, lipid panel, urinalysis, TSH level, hepatic panel, prior to next visit (v70.0)  2)  Increase the Wellbutrin 250 mg twice a day for a couple days.  Continue your exercise program. 3)  Set up a complete physical exam sometime in the next 4 to 6 weeks.  Be sure to get fasting lab work one week at a time   Prescriptions: ESTRACE 2 MG TABS (ESTRADIOL) take one tab once daily  #100 x 3   Entered and Authorized by:   Roderick Pee MD   Signed by:   Roderick Pee MD on 11/16/2008   Method used:   Print then Give to Patient   RxID:   [id number] AMBIEN 10 MG TABS (ZOLPIDEM TARTRATE) 1 tab @ bedtime  as needed  #30 x 3   Entered and Authorized by:   Roderick Pee MD   Signed by:   Roderick Pee MD on 11/16/2008   Method used:   Print then Give to Patient   RxID:   [id number] NASONEX 50 MCG/ACT SUSP (MOMETASONE  FUROATE) use as directed  #3 x 4   Entered and Authorized by:   Roderick Pee MD   Signed by:   Roderick Pee MD on 11/16/2008   Method used:   Print then Give to Patient   RxID:   [id number] ASTELIN 137 MCG/SPRAY SOLN (AZELASTINE HCL) use as directed  #3 x 4   Entered and Authorized by:   Roderick Pee MD   Signed by:   Roderick Pee MD on 11/16/2008  Method used:   Print then Give to Patient   RxID:   [id number] BUDEPRION SR 150 MG XR12H-TAB (BUPROPION HCL) take one tablet once daily  #100 x 3   Entered and Authorized by:   Roderick Pee MD   Signed by:   Roderick Pee MD on 11/16/2008   Method used:   Print then Give to Patient   RxID:   [id number]

## 2010-12-09 NOTE — Letter (Signed)
Summary: Appt Reminder 2  [facility]  [address] [place], [place] [address]   Phone: [phone]  Fax: [phone]        June 18, 2009 MRN: [id number]    [facility] [address] [place], [place]  [address]    Dear Ms. Kiper,   You have a return appointment with Dr.Gessner on 07-17-09 at 9:30am. Please remember to bring a complete list of the medicines you are taking, your insurance card and your co-pay.  If you have to cancel or reschedule this appointment, please call before 5:00 pm the evening before to avoid a cancellation fee.  If you have any questions or concerns, please call [phone].    Sincerely,    Laureen Ochs LPN

## 2010-12-09 NOTE — Assessment & Plan Note (Signed)
Summary: sinus infection 3:15 ok per Rachel/mhf   Vital Signs:  Patient profile:   60 year old female Height:      62.25 inches Weight:      166 pounds BMI:     30.23 BP sitting:   120 / 84  (left arm) Cuff size:   regular  Vitals Entered By: Kern Reap CMA (February 26, 2009 3:35 PM)  Reason for Visit sinus infection  History of Present Illness: pan is a 60 year old female, who comes in today with allergic rhinitis unresponsive to her normal antihistamines nasal steroids and nasal antihistamines.  She has no history of asthma pain for the past 3, week she's had head congestion, postnasal drip, facial discomfort, and left ear pain.  Allergies (verified): No Known Drug Allergies  Past History:  Past medical history reviewed for relevance to current acute and chronic problems. Social history (including risk factors) reviewed for relevance to current acute and chronic problems.  Past Medical History:    Reviewed history from 11/16/2008 and no changes required:    Allergic rhinitis    Anxiety  Social History:    Reviewed history from 11/16/2008 and no changes required:       Occupation:stay at home       Married       Regular exercise-yes  Review of Systems      See HPI  Physical Exam  General:  Well-developed,well-nourished,in no acute distress; alert,appropriate and cooperative throughout examination Head:  Normocephalic and atraumatic without obvious abnormalities. No apparent alopecia or balding. Eyes:  No corneal or conjunctival inflammation noted. EOMI. Perrla. Funduscopic exam benign, without hemorrhages, exudates or papilledema. Vision grossly normal. Ears:  External ear exam shows no significant lesions or deformities.  Otoscopic examination reveals clear canals, tympanic membranes are intact bilaterally without bulging, retraction, inflammation or discharge. Hearing is grossly normal bilaterally. Nose:  External nasal examination shows no deformity or  inflammation. Nasal mucosa are pink and moist without lesions or exudates. Mouth:  Oral mucosa and oropharynx without lesions or exudates.  Teeth in good repair. Neck:  No deformities, masses, or tenderness noted. Cervical Nodes:  No lymphadenopathy noted   Impression & Recommendations:  Problem # 1:  ALLERGIC RHINITIS (ICD-477.9) Assessment Deteriorated  Her updated medication list for this problem includes:    Alavert 10 Mg Tabs (Loratadine) ..... Once daily    Astelin 137 Mcg/spray Soln (Azelastine hcl) ..... Use as directed    Nasonex 50 Mcg/act Susp (Mometasone furoate) ..... Use as directed  Orders: Prescription Created Electronically [phone])  Complete Medication List: 1)  Estrace 2 Mg Tabs (Estradiol) .... Take one tab once daily 2)  Budeprion Sr 150 Mg Xr12h-tab (Bupropion hcl) .... Take one tablet once daily 3)  Alavert 10 Mg Tabs (Loratadine) .... Once daily 4)  Claritin-d 12 Hour 5-120 Mg Xr12h-tab (Loratadine-pseudoephedrine) .... Once daily 5)  Astelin 137 Mcg/spray Soln (Azelastine hcl) .... Use as directed 6)  Nasonex 50 Mcg/act Susp (Mometasone furoate) .... Use as directed 7)  Ambien 10 Mg Tabs (Zolpidem tartrate) .Marland Kitchen.. 1 tab @ bedtime  as needed 8)  Oscal 500/200 D-3 500-200 Mg-unit Tabs (Calcium-vitamin d) .... Once daily 9)  Adult Aspirin Ec Low Strength 81 Mg Tbec (Aspirin) .... Once daily 10)  Prednisone 20 Mg Tabs (Prednisone) .... Uad  Patient Instructions: 1)  begin prednisone by taking two tablets now, then two tablets every morning starting tomorrow morning for 3 days, one tablet x 3 days, a half a tablet x 3  days, then a half a tablet Monday, Wednesday, Friday, for a two week taper.  Once she stopped the prednisone, then restart your other allergy medicines Prescriptions: PREDNISONE 20 MG TABS (PREDNISONE) UAD  #40 x 1   Entered and Authorized by:   Roderick Pee MD   Signed by:   Roderick Pee MD on 02/26/2009   Method used:   Electronically to         [facility]  [phone]* (retail)       [address] [place], [place]  [address]       Ph: [phone] or [phone]       Fax: [phone]   RxID:   [id number]

## 2010-12-09 NOTE — Progress Notes (Signed)
Summary: triage  Phone Note Call from Patient Call back at Home Phone [phone]   Caller: Patient Call For: [name] Payor Reason for Call: Talk to Nurse Summary of Call: Patient states that the medication is making her sick tried what he advised in cutting the pill in half but that did not help, her stomach hurts a lot wants to know what to do Initial call taken by: Tawni Levy,  August 08, 2009 1:45 PM  Follow-up for Phone Call        Patient  with chronic abdominal pain, she feels it may have gotten worse since starting on Protonix.  She is offered an appointment to come in and see [facility] tomorrow, she declines, but wants to try stopping her Protonix and if the pain is still there on Monday she wants to be worked back in.  Patient  says her swallowing is great, not having any problems except constant abdominal pain.  She will call back next week if she is still having pain Follow-up by: Darcey Nora RN, CGRN,  August 08, 2009 2:33 PM

## 2010-12-09 NOTE — Progress Notes (Signed)
Summary: triage  Phone Note Call from Patient Call back at Home Phone [phone]   Caller: Patient Call For: [name] Payor Reason for Call: Talk to Nurse Summary of Call: Patient still has a lot of nausea and is unable to eat wants to know what to do Initial call taken by: Tawni Levy,  June 20, 2009 9:29 AM  Follow-up for Phone Call        Pt. has Phenergan suppostories on hand so I advised her to use them.Told to continue PPI and Hyoscamine as ordered and stay on soft bland food.Will call back when Dr.Joaquim Tolen returns to office next week if is no better.Said she looked up manometry  on the Internet and she doesn't think she could do that test. Follow-up by: Teryl Lucy RN,  June 20, 2009 10:11 AM

## 2010-12-09 NOTE — Miscellaneous (Signed)
Summary: mammogram update  Clinical Lists Changes  Observations: Added new observation of MAMMO DUE: 09/2010 (09/04/2009 11:38) Added new observation of MAMMOGRAM: normal (08/28/2009 11:39)      Preventive Care Screening  Mammogram:    Date:  08/28/2009    Next Due:  09/2010    Results:  normal

## 2010-12-09 NOTE — Progress Notes (Signed)
Summary: Discussion re: globus: PPI +  hyoscyamine, to have appt later  Phone Note Call from Patient   Caller: Patient Summary of Call: Seen in endoscopy (after husband's EGD) She is having problems with globus sensationa and upper abdominal chest pressure. Also some hoarseness. Saw ENT, tok PPI x 1 month not very helpful. Gets acute events with significant tightness in throat which creates anxiety and panic with dyspnea. Does not feel anxious in between. Will try PPI and as needed hyoscyamine and we will make her an appointment to see me in the next few weeks. (Within 1 month) Initial call taken by: Iva Boop MD,  June 06, 2009 4:57 PM    New/Updated Medications: PANTOPRAZOLE SODIUM 40 MG  TBEC (PANTOPRAZOLE SODIUM) 1 each day 30 minutes before meal HYOSCYAMINE SULFATE 0.125 MG  SUBL (HYOSCYAMINE SULFATE) 1-2 SL as needed throat spasms q 4 hrs prn Prescriptions: HYOSCYAMINE SULFATE 0.125 MG  SUBL (HYOSCYAMINE SULFATE) 1-2 SL as needed throat spasms q 4 hrs prn  #60 x 0   Entered and Authorized by:   Iva Boop MD   Signed by:   Iva Boop MD on 06/06/2009   Method used:   [facility]        [facility]  [address]  [phone]* (retail)       [address] [place], [place]  [address]       Ph: [phone] or [phone]       Fax: [phone]   RxID:   [id number] PANTOPRAZOLE SODIUM 40 MG  TBEC (PANTOPRAZOLE SODIUM) 1 each day 30 minutes before meal  #30 x 1   Entered and Authorized by:   Iva Boop MD   Signed by:   Iva Boop MD on 06/06/2009   Method used:   [facility]        [facility]  [address] [address]  [phone]* (retail)       [address] [place], [place]  [address]       Ph: [phone] or [phone]       Fax: [phone]   RxID:   [id number]   Appended Document: Discussion re: globus: PPI +  hyoscyamine, to have appt later appt made for 06-26-2009, patient aware

## 2010-12-09 NOTE — Procedures (Signed)
Summary: EGD   EGD  Procedure date:  06/18/2009  Findings:      Location: [facility]   ENDOSCOPY PROCEDURE REPORT  PATIENT:  Ellen, Collins  MR#:  [id number] BIRTHDATE:   03-Dec-1950, 58 yrs. old   GENDER:   female  ENDOSCOPIST:   Judie Petit T. Russella Dar, MD, [facility]    PROCEDURE DATE:  06/18/2009 PROCEDURE:  EGD with dilatation over guidewire ASA CLASS:   Class II INDICATIONS: 1) dysphagia   MEDICATIONS:   Fentanyl 75 mcg IV, Versed 9 mg IV TOPICAL ANESTHETIC:   Exactacain Spray  DESCRIPTION OF PROCEDURE:   After the risks benefits and alternatives of the procedure were thoroughly explained, informed consent was obtained.  The [id number] endoscope was introduced through the mouth and advanced to the second portion of the duodenum, without limitations.  The instrument was slowly withdrawn as the mucosa was carefully examined. <<PROCEDUREIMAGES>>              <<OLD IMAGES>>  The upper, middle, and distal third of the esophagus were carefully inspected and no abnormalities were noted. The z-line was well seen at the GEJ. The endoscope was advanced into the fundus which was normal including a retroflexed view. The pylorus, antrum, first and second part of the duodenum were unremarkable.  Dilation was then performed at the total esophagus for dysphagia without a stricture:  1) Dilator:   Savary over guidewire   Size:   18 Resistance:   none   Heme:   none     COMPLICATIONS:   None  ENDOSCOPIC IMPRESSION:  1) Normal EGD  RECOMMENDATIONS:  1) PPI BID for 2 weeks  2) anti-reflux regimen  3) follow-up: GI clinic 2 weeks with Dr. [name] Payor consider manometry if dysphagia persists     Genae Strine T. Russella Dar, MD, [facility]   CC: Eugenio Hoes. Tawanna Cooler, M.D.  Iva Boop, M.D., [facility]    Appended Document: EGD Pt. scheduled for the 1st available appt. w/Dr.Gessner on 07-17-09 at 9:30am.

## 2010-12-09 NOTE — Progress Notes (Signed)
Summary: refills  Phone Note Refill Request   Refills Requested: Medication #1:  AMITRIPTYLINE HCL 25 MG TABS 1 tab @ bedtime  Medication #2:  ESTRACE 2 MG TABS take one tab every other day and 1/2 tablet by mouth every other day Initial call taken by: Kern Reap CMA Duncan Dull),  January 21, 2010 1:08 PM    Prescriptions: ESTRACE 2 MG TABS (ESTRADIOL) take one tab every other day and 1/2 tablet by mouth every other day  #60 x 3   Entered by:   Kern Reap CMA (AAMA)   Authorized by:   Roderick Pee MD   Signed by:   Kern Reap CMA (AAMA) on 01/21/2010   Method used:   Electronically to        [facility]  [phone]* (retail)       [address] [place], [place]  [address]       Ph: [phone] or [phone]       Fax: [phone]   RxID:   [id number] ASTELIN 137 MCG/SPRAY SOLN (AZELASTINE HCL) UAD  #3 x 4   Entered by:   Kern Reap CMA (AAMA)   Authorized by:   Roderick Pee MD   Signed by:   Kern Reap CMA (AAMA) on 01/21/2010   Method used:   Electronically to        [facility]  [phone]* (retail)       [address] [place], [place]  [address]       Ph: [phone] or [phone]       Fax: [phone]   RxID:   [id number]

## 2010-12-09 NOTE — Miscellaneous (Signed)
Summary: mammogram update  Clinical Lists Changes  Observations: Added new observation of MAMMOGRAM: normal (09/01/2010 12:29)      Preventive Care Screening  Mammogram:    Date:  09/01/2010    Results:  normal

## 2010-12-09 NOTE — Progress Notes (Signed)
Summary: not better  Phone Note Call from Patient   Caller: Patient Call For: Roderick Pee MD Summary of Call: patient is calling because she is still not feeling well.  she was seen 02/27/19 and given prednisone.  this does not seem to be helping.  any suggestions?  home number is [phone] Initial call taken by: Kern Reap CMA,  March 07, 2009 11:44 AM  Follow-up for Phone Call        Phone Call Completed Follow-up by: Kern Reap CMA,  March 07, 2009 2:36 PM    New/Updated Medications: AMOXICILLIN 500 MG TABS (AMOXICILLIN) take one tab three times a day for sinusitis Amoxicillin 500 mg dispense 50 tablets directions 13 times a day to bottle empty for sinusitis, refills x 1  Prescriptions: AMOXICILLIN 500 MG TABS (AMOXICILLIN) take one tab three times a day for sinusitis  #50 x 1   Entered by:   Kern Reap CMA   Authorized by:   Roderick Pee MD   Signed by:   Kern Reap CMA on 03/07/2009   Method used:   Electronically to        [facility]  [phone]* (retail)       [address] [place], [place]  [address]       Ph: [phone] or [phone]       Fax: [phone]   RxID:   [id number]

## 2010-12-09 NOTE — Progress Notes (Signed)
Summary: nausea med  Phone Note Call from Patient Call back at [phone]   Caller: vm Call For: Hebert Dooling Summary of Call: Virus I believe.  It's been going around at my husband's office.  So nauseated.  Can Dr. Karie Schwalbe call in something?  Husband also called.  Elmond c [phone] Wife very nauseated a couple days.  Rudy Jew, RN  July 29, 2009 12:12 PM  Initial call taken by: Rudy Jew, RN,  July 29, 2009 12:10 PM  Follow-up for Phone Call        hands GI workup was negative.  Dr. [name] Payor feels these are panic attacks.  Also, now.  She has a GI virus manifested by nausea, vomiting, no diarrhea times a day, and a half.  Called in Phenergan suppositories for her acute illness recommended.  Dr. Rolly Pancake, Nolen Mu for consult Follow-up by: Roderick Pee MD,  July 29, 2009 12:59 PM    New/Updated Medications: PROMETHAZINE HCL 25 MG SUPP (PROMETHAZINE HCL) 1 rectally q 8 hr. as needed nausea Prescriptions: PROMETHAZINE HCL 25 MG SUPP (PROMETHAZINE HCL) 1 rectally q 8 hr. as needed nausea  #6 x 1   Entered and Authorized by:   Roderick Pee MD   Signed by:   Roderick Pee MD on 07/29/2009   Method used:   Electronically to        [facility]  [phone]* (retail)       [address] [place], [place]  [address]       Ph: [phone] or [phone]       Fax: [phone]   RxID:   [id number]

## 2010-12-09 NOTE — Progress Notes (Signed)
Summary: triage  Phone Note Call from Patient Call back at Home Phone [phone]   Caller: Patient Call For: [name] Payor Reason for Call: Talk to Nurse Summary of Call: Patient states that she can swallow wants to be seen today Initial call taken by: Tawni Levy,  June 17, 2009 8:21 AM  Follow-up for Phone Call        Pt. had rough week end used meds. for esophageal  spasm   Hyoscamine and Protonix with no relief.Feels like solids are hanging up.Tried Oatmeal and it just lays there.Gets panicky  so she re-started her Wellbutrin 3 days ago.Esophagus feels very tight and she tries to throw up for relief.Given an appt. with P.A. for today. Follow-up by: Teryl Lucy RN,  June 17, 2009 9:08 AM

## 2010-12-09 NOTE — Assessment & Plan Note (Signed)
Summary: SEVERE DYSPHAGIA       (DR.GESSNER PT.)   Ellen Collins   History of Present Illness Visit Type: Follow-up Visit Primary GI MD: Stan Head MD [facility] Primary Provider: Kelle Darting, MD Chief Complaint: trouble swallowing, nausea and not able to eat History of Present Illness:   60 YO FEMALE WHO WAS SEEN ON MONDAY WITH NEW C/O SEVERE DYSPHAGIA AND A SENSE THAT HER THROAT IS CLOSING UP. SHE WAS VERY ANXIOUS AND ADMITTEDLY PANICLKY ABOUT HER SXS. SHE HAD BEEN PLACED ON PROTONIX. SHE WAS SCHEDULED FOR URGENT EGD WITH DR STARK ON 8/10. THIS WAS NORMAL. SHE WAS EMPIRICALLY DILATED/SAVARY 18.SHE HAS NOT NOTICED ANY IMPROVEMENT IN HER SXS. SHE IS EXTREMELY ANXIOUS,AND FEELS WEAK BECAUSE SHE IS NOT EATING MUCH AT ALL.SHE DID TRY TO EAT SOME SOFT CHICKEN LAST NIGHT WENT DOWN THEN FELT TIGHTNESS IN SUBXYPHOID AREA THEN FEELS LIKE COMING BACK UP AND GETS TIGHT FEELING IN CHEST AND THROAT.HYOMAX NOT HELPING AND MOUTH VERY DRY. SHE HAD ALSO STARTED HERSELF ON BUPROPION  A WEEK AGO , AND ASKS IF THAT MAY BE MAKING HER FEEL STRANGE.   GI Review of Systems    Reports belching, chest pain, dysphagia with liquids, dysphagia with solids, loss of appetite, and  nausea.      Denies abdominal pain, acid reflux, bloating, heartburn, vomiting, vomiting blood, and  weight loss.        Denies anal fissure, black tarry stools, change in bowel habit, constipation, diarrhea, diverticulosis, fecal incontinence, heme positive stool, hemorrhoids, irritable bowel syndrome, jaundice, light color stool, liver problems, rectal bleeding, and  rectal pain.    Current Medications (verified): 1)  Estrace 2 Mg Tabs (Estradiol) .... Take One Tab Every Other Day and 1/2 Tablet By Mouth Every Other Day 2)  Budeprion Sr 150 Mg Xr12h-Tab (Bupropion Hcl) .... Take One Tablet Once Daily 3)  Alavert 10 Mg Tabs (Loratadine) .... Once Daily 4)  Ambien 10 Mg Tabs (Zolpidem Tartrate) .Marland Kitchen.. 1 Tab @ Bedtime  As Needed 5)  Oscal 500/200 D-3  500-200 Mg-Unit Tabs (Calcium-Vitamin D) .... Once Daily 6)  Pantoprazole Sodium 40 Mg  Tbec (Pantoprazole Sodium) .Marland Kitchen.. 1 Each Day 30 Minutes Before Meal 7)  Hyoscyamine Sulfate 0.125 Mg  Subl (Hyoscyamine Sulfate) .Marland Kitchen.. 1-2 Sl As Needed Throat Spasms Q 4 Hrs Prn 8)  Astelin 137 Mcg/spray Soln (Azelastine Hcl) .... As Needed 9)  Nasonex 50 Mcg/act Susp (Mometasone Furoate) .... As Needed  Allergies (verified): 1)  ! * Latex  Family History: Reviewed history from 11/16/2008 and no changes required. Father: deceased - colon cancer Mother: deceased - ovarian cancer Siblings: brother - healthy  Social History: Reviewed history from 06/17/2009 and no changes required. Occupation:stay at home Married  No children Regular exercise-yes Patient has never smoked.  Alcohol Use - yes   2 per week Illicit Drug Use - no  Review of Systems       The patient complains of anxiety-new and fatigue.  The patient denies allergy/sinus, anemia, arthritis/joint pain, back pain, blood in urine, breast changes/lumps, change in vision, confusion, cough, coughing up blood, depression-new, fainting, fever, headaches-new, hearing problems, heart murmur, heart rhythm changes, itching, menstrual pain, muscle pains/cramps, night sweats, nosebleeds, skin rash, sleeping problems, sore throat, swelling of feet/legs, swollen lymph glands, thirst - excessive, urination - excessive, urination changes/pain, urine leakage, and vision changes.    Vital Signs:  Patient profile:   60 year old female Height:      62.25 inches Weight:  154.25 pounds BMI:     28.09 Pulse rate:   104 / minute Pulse rhythm:   regular BP sitting:   128 / 78  (left arm)  Vitals Entered By: Milford Cage NCMA (June 21, 2009 2:10 PM)  Physical Exam  General:  Well developed, well nourished, no acute distress. Head:  Normocephalic and atraumatic. Eyes:  PERRLA, no icterus. Neck:  Supple; no masses or thyromegaly. Lungs:  Clear  throughout to auscultation. Heart:  Regular rate and rhythm; no murmurs, rubs,  or bruits. Abdomen:  SOFT,NONTENDER, NO MASS OR HSM,BS+ Rectal:  NOT DONE Neurologic:  Alert and  oriented x4;  grossly normal neurologically. Psych:  anxious.     Impression & Recommendations:  Problem # 1:  DYSPHAGIA (MVH-846.96) Assessment Deteriorated 60 YO FEMALE WITH PERSISTENT DYSPHAGIA,AND SENSE OF TIGHTNESS AND "CLOSING " OF THROAT. NORMAL EGD ,AND NO RESPONSE TO EMPIRIC DILATION. R/O MOTILITY DISORDER  ?DES. R/O PRIMARILY ANXIEY/PANIC DISORDER.  STOP PROTONIX,START TRIAL OF PREVACID SOLUTAB 30 MG DAILY STOP HYOMAX,CAUSING DRY THROAT STOP BUPROPION- MAY BE HAVING ADVERSE SIDE EFFECTS AND JUST STARTED LAST WEEK. TRIAL OF XANAX 0.25 3 X DAILY AS NEEDED. SCHEDULE BA SWALLOW  R/O ACHALSIA ETC SCHEDULE ESOPHAGEAL MANOMETRY FOLLOW UP DR GESSNER IN 2 WEEKS. Orders: Barium Swallow (Barium Swallow)  Problem # 2:  FAMILY HX COLON CANCER (ICD-V16.0) Assessment: Comment Only PLAN FOLLOW UP COLONOSCOPY 07/2011  Other Orders: Manometry (Manometry)  Patient Instructions: 1)  Stop the Hyomax. 2)  Stop the Wellbutrin. 3)  Stop the Protonix. 4)  We sent a prescription to your pharmacy for Prevacid Solutab.  5)  We do have a presciption for Xanax for you to take to your pharmacy. 6)  Clear liquid diet for  the next 24 hours, then slowly progress diet as tolerated.  7)  We scheduled the Barium Swallow for 06-25-09. Directions provided. 8)  We scheduled the Manometry for 07-02-09. Directions provided. 9)  Follow up with Dr. Russella Dar on 07-23-09 at 2:15PM. 10)  Copy sent to : Kelle Darting, MD 11)  The medication list was reviewed and reconciled.  All changed / newly prescribed medications were explained.  A complete medication list was provided to the patient / caregiver. Prescriptions: PREVACID SOLUTAB 30 MG TBDP (LANSOPRAZOLE) Dissolve 1 tab on tongue once daily  #31 x 3   Entered by:   Chales Abrahams CMA (AAMA)    Authorized by:   Sammuel Cooper PA-c   Signed by:   Chales Abrahams CMA (AAMA) on 06/21/2009   Method used:   Electronically to        [facility]  [phone]* (retail)       [address] [place], [place]  [address]       Ph: [phone] or [phone]       Fax: [phone]   RxID:   [id number] ALPRAZOLAM 0.25 MG TABS (ALPRAZOLAM) Take 1 tab 3 times daily as needed  #30 x 1   Entered by:   Chales Abrahams CMA (AAMA)   Authorized by:   Sammuel Cooper PA-c   Signed by:   Chales Abrahams CMA (AAMA) on 06/21/2009   Method used:   Printed then faxed to ...       [facility]  [phone]* (retail)       [address] [place], [place]  [address]       Ph: [phone] or [phone]  Fax: [phone]   RxID:   [id number]   Appended Document: SEVERE DYSPHAGIA       (DR.GESSNER PT.)   [facility] Appt rescheduled for 07-19-09 11:30

## 2010-12-09 NOTE — Progress Notes (Signed)
Summary: Stopped her meds-feels better  Phone Note Call from Patient Call back at Home Phone [phone]   Call For: Dr [name] Payor Reason for Call: Talk to Nurse Summary of Call: Did go to ER and they found nothing wrong. Stopped all her meds and feels much better Initial call taken by: Leanor Kail [facility],  August 12, 2009 9:31 AM  Follow-up for Phone Call        FYI Follow-up by: Darcey Nora RN, CGRN,  August 12, 2009 9:52 AM  Additional Follow-up for Phone Call Additional follow up Details #1::        ok lets be sure what stopping all meds means citalopram should be dced and she can then continue with xanax as she was taking. did not see follow-up with me in appts I believe we were going to after citalopram in her system but if she is swallowing ok and intermittentr xanax working can stay with that and see me as needed or for refills within 1 year (or Dr. Tawanna Cooler may be willing to fill)    Additional Follow-up by: Iva Boop MD, Clementeen Graham,  August 12, 2009 10:46 AM    Additional Follow-up for Phone Call Additional follow up Details #2::    Patient notified Follow-up by: Darcey Nora RN, CGRN,  August 12, 2009 11:26 AM

## 2011-02-12 LAB — DIFFERENTIAL
Basophils Absolute: 0.1 10*3/uL (ref 0.0–0.1)
Basophils Relative: 1 % (ref 0–1)
Eosinophils Absolute: 0 10*3/uL (ref 0.0–0.7)
Eosinophils Relative: 0 % (ref 0–5)
Lymphocytes Relative: 19 % (ref 12–46)
Lymphs Abs: 1.9 10*3/uL (ref 0.7–4.0)
Monocytes Absolute: 0.4 10*3/uL (ref 0.1–1.0)
Monocytes Relative: 4 % (ref 3–12)
Neutro Abs: 7.6 10*3/uL (ref 1.7–7.7)
Neutrophils Relative %: 77 % (ref 43–77)

## 2011-02-12 LAB — POCT CARDIAC MARKERS
CKMB, poc: 1.2 ng/mL (ref 1.0–8.0)
Myoglobin, poc: 49.3 ng/mL (ref 12–200)
Troponin i, poc: <0.05 ng/mL (ref 0.00–0.09)

## 2011-02-12 LAB — CBC
HCT: 40.2 % (ref 36.0–46.0)
Hemoglobin: 13.7 g/dL (ref 12.0–15.0)
MCHC: 34.2 g/dL (ref 30.0–36.0)
MCV: 94 fL (ref 78.0–100.0)
Platelets: 293 10*3/uL (ref 150–400)
RBC: 4.27 MIL/uL (ref 3.87–5.11)
RDW: 13 % (ref 11.5–15.5)
WBC: 9.9 10*3/uL (ref 4.0–10.5)

## 2011-02-12 LAB — COMPREHENSIVE METABOLIC PANEL
ALT: 22 U/L (ref 0–35)
AST: 19 U/L (ref 0–37)
Albumin: 4 g/dL (ref 3.5–5.2)
Alkaline Phosphatase: 55 U/L (ref 39–117)
BUN: 8 mg/dL (ref 6–23)
CO2: 25 mEq/L (ref 19–32)
Calcium: 9.4 mg/dL (ref 8.4–10.5)
Chloride: 102 mEq/L (ref 96–112)
Creatinine, Ser: 0.83 mg/dL (ref 0.4–1.2)
GFR calc Af Amer: >60 mL/min (ref >60)
GFR calc non Af Amer: >60 mL/min (ref >60)
Glucose, Bld: 127 mg/dL — ABNORMAL HIGH (ref 70–99)
Potassium: 3.5 mEq/L (ref 3.5–5.1)
Sodium: 137 mEq/L (ref 135–145)
Total Bilirubin: 0.9 mg/dL (ref 0.3–1.2)
Total Protein: 7 g/dL (ref 6.0–8.3)

## 2011-02-12 LAB — URINALYSIS, ROUTINE W REFLEX MICROSCOPIC
Bilirubin Urine: NEGATIVE
Glucose, UA: NEGATIVE mg/dL
Ketones, ur: 40 mg/dL — AB
Leukocytes, UA: NEGATIVE
Nitrite: NEGATIVE
Protein, ur: NEGATIVE mg/dL
Specific Gravity, Urine: 1.013 (ref 1.005–1.030)
Urobilinogen, UA: 0.2 mg/dL (ref 0.0–1.0)
pH: 6 (ref 5.0–8.0)

## 2011-02-12 LAB — URINE MICROSCOPIC-ADD ON

## 2011-08-26 ENCOUNTER — Encounter: Payer: Self-pay | Admitting: Internal Medicine

## 2011-10-19 LAB — HM MAMMOGRAPHY: HM Mammogram: NEGATIVE

## 2011-10-23 ENCOUNTER — Encounter: Payer: Self-pay | Admitting: Family Medicine

## 2011-12-07 ENCOUNTER — Ambulatory Visit (AMBULATORY_SURGERY_CENTER): Payer: BC Managed Care – PPO | Admitting: *Deleted

## 2011-12-07 VITALS — Ht 67.0 in | Wt 160.0 lb

## 2011-12-07 DIAGNOSIS — Z1211 Encounter for screening for malignant neoplasm of colon: Secondary | ICD-10-CM

## 2011-12-07 MED ORDER — PEG-KCL-NACL-NASULF-NA ASC-C 100 G PO SOLR: ORAL | Status: DC

## 2011-12-10 ENCOUNTER — Ambulatory Visit (INDEPENDENT_AMBULATORY_CARE_PROVIDER_SITE_OTHER): Payer: BC Managed Care – PPO | Admitting: Family Medicine

## 2011-12-10 ENCOUNTER — Encounter: Payer: Self-pay | Admitting: Family Medicine

## 2011-12-10 VITALS — BP 120/78 | Temp 98.5°F

## 2011-12-10 DIAGNOSIS — J309 Allergic rhinitis, unspecified: Secondary | ICD-10-CM

## 2011-12-10 LAB — LIPID PANEL
Cholesterol: 221 mg/dL — ABNORMAL HIGH (ref 0–200)
HDL: 63.1 mg/dL (ref >39.00)
Total CHOL/HDL Ratio: 4
Triglycerides: 243 mg/dL — ABNORMAL HIGH (ref 0.0–149.0)
VLDL: 48.6 mg/dL — ABNORMAL HIGH (ref 0.0–40.0)

## 2011-12-10 LAB — CBC WITH DIFFERENTIAL/PLATELET
Basophils Absolute: 0 10*3/uL (ref 0.0–0.1)
Basophils Relative: 0.4 % (ref 0.0–3.0)
Eosinophils Absolute: 0.2 10*3/uL (ref 0.0–0.7)
Eosinophils Relative: 3 % (ref 0.0–5.0)
HCT: 35.2 % — ABNORMAL LOW (ref 36.0–46.0)
Hemoglobin: 12.3 g/dL (ref 12.0–15.0)
Lymphocytes Relative: 39.2 % (ref 12.0–46.0)
Lymphs Abs: 2.5 10*3/uL (ref 0.7–4.0)
MCHC: 35 g/dL (ref 30.0–36.0)
MCV: 93.4 fl (ref 78.0–100.0)
Monocytes Absolute: 0.5 10*3/uL (ref 0.1–1.0)
Monocytes Relative: 8.4 % (ref 3.0–12.0)
Neutro Abs: 3.1 10*3/uL (ref 1.4–7.7)
Neutrophils Relative %: 49 % (ref 43.0–77.0)
Platelets: 321 10*3/uL (ref 150.0–400.0)
RBC: 3.77 Mil/uL — ABNORMAL LOW (ref 3.87–5.11)
RDW: 13 % (ref 11.5–14.6)
WBC: 6.4 10*3/uL (ref 4.5–10.5)

## 2011-12-10 LAB — POCT URINALYSIS DIPSTICK
Bilirubin, UA: NEGATIVE
Glucose, UA: NEGATIVE
Ketones, UA: NEGATIVE
Leukocytes, UA: NEGATIVE
Nitrite, UA: NEGATIVE
Protein, UA: NEGATIVE
Spec Grav, UA: 1.01
Urobilinogen, UA: 0.2
pH, UA: 5.5

## 2011-12-10 LAB — BASIC METABOLIC PANEL
BUN: 14 mg/dL (ref 6–23)
CO2: 32 mEq/L (ref 19–32)
Calcium: 9 mg/dL (ref 8.4–10.5)
Chloride: 99 mEq/L (ref 96–112)
Creatinine, Ser: 0.9 mg/dL (ref 0.4–1.2)
GFR: 72.76 mL/min (ref >60.00)
Glucose, Bld: 104 mg/dL — ABNORMAL HIGH (ref 70–99)
Potassium: 3.7 mEq/L (ref 3.5–5.1)
Sodium: 139 mEq/L (ref 135–145)

## 2011-12-10 LAB — HEPATIC FUNCTION PANEL
ALT: 18 U/L (ref 0–35)
AST: 15 U/L (ref 0–37)
Albumin: 4 g/dL (ref 3.5–5.2)
Alkaline Phosphatase: 44 U/L (ref 39–117)
Bilirubin, Direct: 0 mg/dL (ref 0.0–0.3)
Total Bilirubin: 0.5 mg/dL (ref 0.3–1.2)
Total Protein: 7 g/dL (ref 6.0–8.3)

## 2011-12-10 LAB — TSH: TSH: 0.51 u[IU]/mL (ref 0.35–5.50)

## 2011-12-10 LAB — LDL CHOLESTEROL, DIRECT: Direct LDL: 130.4 mg/dL

## 2011-12-10 MED ORDER — PREDNISONE 20 MG PO TABS: ORAL_TABLET | ORAL | Status: DC

## 2011-12-10 MED ORDER — FLUTICASONE PROPIONATE 50 MCG/ACT NA SUSP: NASAL | Status: DC

## 2011-12-10 NOTE — Progress Notes (Signed)
  Subjective:    Patient ID: Ellen Collins, female    DOB: 1950-11-26, 61 y.o.   MRN: [id number]  HPI Ellen Collins is a 61 year old married female nonsmoker who comes in today because of a flare of her large rhinitis  She takes Claritin on a daily basis however about 2 weeks ago she developed head congestion. It always seemed to involve her left frontal and maxillary sinuses. Couple years ago we did a CT scan and an ENT consult she had nasal endoscopy all of which were normal.  No cough etc.    Review of Systems General and immunologic review of systems otherwise negative,,,,,,,,,,,, she took allergy shots for 8 years    Objective:   Physical Exam Well-developed well-nourished female in acute distress HEENT negative neck was supple no adenopathy the septum was in the midline she has 3+ nasal edema       Assessment & Plan:  Allergic rhinitis plan prednisone burst and taper when off prednisone resume Claritin and add steroid nasal spray return for CPX

## 2011-12-10 NOTE — Patient Instructions (Signed)
Take the prednisone as directed  When you begin to taper the prednisone to a half a tablet Monday Wednesday Friday then restart the Claritin and the steroid nasal spray one shot up each nostril twice daily  Return to sometime in the next 4-6 weeks for general physical examination  Lab work today

## 2011-12-21 ENCOUNTER — Encounter: Payer: Self-pay | Admitting: Internal Medicine

## 2011-12-21 ENCOUNTER — Ambulatory Visit (AMBULATORY_SURGERY_CENTER): Payer: BC Managed Care – PPO | Admitting: Internal Medicine

## 2011-12-21 VITALS — BP 125/69 | HR 88 | Temp 96.0°F | Resp 13 | Ht 67.0 in | Wt 160.0 lb

## 2011-12-21 DIAGNOSIS — Z1211 Encounter for screening for malignant neoplasm of colon: Secondary | ICD-10-CM

## 2011-12-21 DIAGNOSIS — Z800 Family history of malignant neoplasm of digestive organs: Secondary | ICD-10-CM

## 2011-12-21 MED ORDER — SODIUM CHLORIDE 0.9 % IV SOLN: 500.0000 mL | INTRAVENOUS | Status: DC

## 2011-12-21 NOTE — Patient Instructions (Addendum)
The colonoscopy was normal today. Next routine repeat colonoscopy in about 5 years - 2018. Iva Boop, MD,FOLLOW DISCHARGE INSTRUCTIONS [facility] AND GREEN SHEETS).Marland Kitchen

## 2011-12-21 NOTE — Progress Notes (Signed)
2952 pt. Heart rate to 145, spouse states it went to 200 immediately after drinking water. Pt states that she has a "tachy-arrthymia" States this happen to her a lot. Pt. Assymtomatic.   [phone] Dr. [name] Payor in.  Reviewed strip and vital signs while in recovery. Pt calm. Denies any discomfort. Color pink. Skin warm and Dry. Vital signs within limits of her visit to clinic. Pt advised Dr. [name] Payor and nurse that she has had cardiac evaluations in the past. Dr. [name] Payor stated that she was able to be discharged.  Patient did not experience any of the following events: a burn prior to discharge; a fall within the facility; wrong site/side/patient/procedure/implant event; or a hospital transfer or hospital admission upon discharge from the facility. [phone]) Patient did not have preoperative order for IV antibiotic SSI prophylaxis. [phone])

## 2011-12-21 NOTE — Op Note (Signed)
[facility] [address] [place], [place]  [address]  COLONOSCOPY PROCEDURE REPORT  PATIENT:  Brandalyn, Harting  MR#:  [id number] BIRTHDATE:  07/06/1951, 61 yrs. old  GENDER:  female ENDOSCOPIST:  Iva Boop, MD, [facility]  PROCEDURE DATE:  12/21/2011 PROCEDURE:  Colonoscopy 84696 ASA CLASS:  Class I INDICATIONS:  Elevated Risk Screening, family history of colon cancer Father had colon cancer in his 57's MEDICATIONS:   These medications were titrated to patient response per physician's verbal order, Fentanyl 75 mcg IV, Versed 9 mg IV  DESCRIPTION OF PROCEDURE:   After the risks benefits and alternatives of the procedure were thoroughly explained, informed consent was obtained.  Digital rectal exam was performed and revealed no abnormalities.   The LB 180AL [id number] endoscope was introduced through the anus and advanced to the cecum, which was identified by both the appendix and ileocecal valve, without limitations.  The quality of the prep was excellent, using MoviPrep.  The instrument was then slowly withdrawn as the colon was fully examined. <<PROCEDUREIMAGES>>  FINDINGS:  A normal appearing cecum, ileocecal valve, and appendiceal orifice were identified. The ascending, hepatic flexure, transverse, splenic flexure, descending, sigmoid colon, and rectum appeared unremarkable.   Retroflexed views in the right colon and rectum revealed no abnormalities.    The time to cecum = 2:34 minutes. The scope was then withdrawn in 10:45 minutes from the cecum and the procedure completed. COMPLICATIONS:  None ENDOSCOPIC IMPRESSION: 1) Normal colonoscopy with excellent prep  REPEAT EXAM:  In 5 year(s) for routine screening colonoscopy (family history of colon cancer)  Iva Boop, MD, [facility]  CC:  The Patient  n. eSIGNED:   Iva Boop at 12/21/2011 09:08 AM  Susanne Borders, [phone]

## 2011-12-22 ENCOUNTER — Telehealth: Payer: Self-pay | Admitting: *Deleted

## 2011-12-22 NOTE — Telephone Encounter (Signed)
  Follow up Call-  Call back number 12/21/2011  Post procedure Call Back phone  # [phone]  Permission to leave phone message Yes     Patient questions:  Do you have a fever, pain , or abdominal swelling? no Pain Score  0 *  Have you tolerated food without any problems? yes  Have you been able to return to your normal activities? yes  Do you have any questions about your discharge instructions: Diet   no Medications  no Follow up visit  no  Do you have questions or concerns about your Care? no  Actions: * If pain score is 4 or above: No action needed, pain <4.

## 2012-01-21 ENCOUNTER — Encounter: Payer: Self-pay | Admitting: Family Medicine

## 2012-01-21 ENCOUNTER — Ambulatory Visit (INDEPENDENT_AMBULATORY_CARE_PROVIDER_SITE_OTHER): Payer: BC Managed Care – PPO | Admitting: Family Medicine

## 2012-01-21 DIAGNOSIS — J309 Allergic rhinitis, unspecified: Secondary | ICD-10-CM

## 2012-01-21 DIAGNOSIS — F411 Generalized anxiety disorder: Secondary | ICD-10-CM

## 2012-01-21 DIAGNOSIS — Z0000 Encounter for general adult medical examination without abnormal findings: Secondary | ICD-10-CM

## 2012-01-21 DIAGNOSIS — M542 Cervicalgia: Secondary | ICD-10-CM

## 2012-01-21 DIAGNOSIS — R1319 Other dysphagia: Secondary | ICD-10-CM

## 2012-01-21 DIAGNOSIS — F410 Panic disorder [episodic paroxysmal anxiety] without agoraphobia: Secondary | ICD-10-CM

## 2012-01-21 MED ORDER — FLUTICASONE PROPIONATE 50 MCG/ACT NA SUSP: NASAL | Status: DC

## 2012-01-21 MED ORDER — TRAMADOL HCL 50 MG PO TABS: ORAL_TABLET | ORAL | Status: DC

## 2012-01-21 MED ORDER — ZOLPIDEM TARTRATE 5 MG PO TABS: 2.5000 mg | ORAL_TABLET | Freq: Every evening | ORAL | Status: DC | PRN

## 2012-01-21 MED ORDER — ALPRAZOLAM 0.25 MG PO TABS: 0.2500 mg | ORAL_TABLET | Freq: Every evening | ORAL | Status: DC | PRN

## 2012-01-21 NOTE — Patient Instructions (Signed)
Continue your current medications  When you neck pain flares up use a combination of Motrin 600 mg twice daily with food, tramadol one half to one tablet every 4-6 hours for pain. If the pain persists call us and we'll get you set up for physical therapy  Return in one year sooner if any problem  For cardiovascular risk reduction I would recommend you take a baby aspirin daily because of the estrogen

## 2012-01-21 NOTE — Progress Notes (Signed)
  Subjective:    Patient ID: Ellen Collins, female    DOB: Nov 03, 1951, 61 y.o.   MRN: [id number]  HPI Ellen Collins is a 61 year old married female nonsmoker who comes in today for general physical examination  She sees her GYN on a yearly basis. She continues to take estrogen. She had a TAH and BSO when she was 47 and has been on hormone replacement now for the past 14 years. I explained to her in detail the risk of continued hormones including cardiovascular disease and breast cancer she understands the risk  She has allergic rhinitis she takes steroid nasal spray and Claritin  She also has occasional insomnia and takes a half of a 5 mg Ambien tablet when necessary  She would also like a refill on the alprazolam 0.25 mg she states she only takes one or 2 pills a month. 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 She also has light skin and light eyes no history of skin cancer however we would do a thorough skin exam     Review of Systems  Constitutional: Negative.   HENT: Negative.   Eyes: Negative.   Respiratory: Negative.   Cardiovascular: Negative.   Gastrointestinal: Negative.   Genitourinary: Negative.   Musculoskeletal: Negative.   Neurological: Negative.   Hematological: Negative.   Psychiatric/Behavioral: Negative.        Objective:   Physical Exam  Constitutional: She appears well-developed and well-nourished.  HENT:  Head: Normocephalic and atraumatic.  Right Ear: External ear normal.  Left Ear: External ear normal.  Nose: Nose normal.  Mouth/Throat: Oropharynx is clear and moist.  Eyes: EOM are normal.  Pupils are equal, round, and reactive to light.  Neck: Normal range of motion. Neck supple. No thyromegaly present.  Cardiovascular: Normal rate, regular rhythm, normal heart sounds and intact distal pulses.  Exam reveals no gallop and no friction rub.   No murmur heard. Pulmonary/Chest: Effort normal and breath sounds normal.  Abdominal: Soft. Bowel sounds are normal. She exhibits no distension and no mass. There is no tenderness. There is no rebound.  Genitourinary:       Bilateral breast exam normal  Musculoskeletal: Normal range of motion.  Lymphadenopathy:    She has no cervical adenopathy.  Neurological: She is alert. She has normal reflexes. No cranial nerve deficit. She exhibits normal muscle tone. Coordination normal.  Skin: Skin is warm and dry.       Total body skin exam normal Lesion right breast seborrheic keratosis 10:00 position  Psychiatric: She has a normal mood and affect. Her behavior is normal. Judgment and thought content normal.          Assessment & Plan:  Healthy female  Allergic rhinitis continue Claritin and steroid nasal spray  Occasional insomnia Ambien 5 mg dose one half tab each bedtime when necessary  Occasional anxiety alprazolam 0.25 mg when necessary  10 at this observe  Degenerative disease of her neck Motrin 600 twice a day tramadol and physical therapy.

## 2012-07-26 ENCOUNTER — Other Ambulatory Visit: Payer: Self-pay | Admitting: *Deleted

## 2012-07-26 DIAGNOSIS — F411 Generalized anxiety disorder: Secondary | ICD-10-CM

## 2012-07-26 MED ORDER — ZOLPIDEM TARTRATE 5 MG PO TABS: 2.5000 mg | ORAL_TABLET | Freq: Every evening | ORAL | Status: DC | PRN

## 2012-08-02 ENCOUNTER — Ambulatory Visit (INDEPENDENT_AMBULATORY_CARE_PROVIDER_SITE_OTHER): Payer: BC Managed Care – PPO

## 2012-08-02 DIAGNOSIS — Z23 Encounter for immunization: Secondary | ICD-10-CM

## 2012-09-22 ENCOUNTER — Encounter: Payer: Self-pay | Admitting: Family Medicine

## 2012-09-22 ENCOUNTER — Ambulatory Visit (INDEPENDENT_AMBULATORY_CARE_PROVIDER_SITE_OTHER): Payer: BC Managed Care – PPO | Admitting: Family Medicine

## 2012-09-22 VITALS — BP 150/90 | Temp 97.9°F | Wt 172.0 lb

## 2012-09-22 DIAGNOSIS — L299 Pruritus, unspecified: Secondary | ICD-10-CM | POA: Insufficient documentation

## 2012-09-22 MED ORDER — BETAMETHASONE VALERATE 0.1 % EX LOTN: 1.0000 "application " | TOPICAL_LOTION | Freq: Two times a day (BID) | CUTANEOUS | Status: DC

## 2012-09-22 NOTE — Progress Notes (Signed)
  Subjective:    Patient ID: Ellen Collins, female    DOB: 08-01-1951, 61 y.o.   MRN: [id number]  HPI Ellen Collins is a 61 year old married female nonsmoker who comes in today for evaluation of itching years  She also thinks there may be some wax in her left ear. She's used a cortisone lotion in the past   Review of Systems    ENT review of systems otherwise negative Objective:   Physical Exam  Well-developed well-nourished female in acute distress examination of the HEENT was negative except for your wax in her left ear which was removed with gentle irrigation. She also is extremely small and dry ear canals      Assessment & Plan:  Itchy ears steroid lotion each bedtime when necessary

## 2012-09-22 NOTE — Patient Instructions (Signed)
Small amounts of the steroid lotion in each ear canal bedtime when necessary

## 2012-09-23 ENCOUNTER — Other Ambulatory Visit: Payer: Self-pay | Admitting: *Deleted

## 2012-09-23 MED ORDER — BETAMETHASONE VALERATE 0.1 % EX LOTN: 1.0000 "application " | TOPICAL_LOTION | Freq: Two times a day (BID) | CUTANEOUS | Status: DC

## 2013-02-15 ENCOUNTER — Telehealth: Payer: Self-pay | Admitting: *Deleted

## 2013-02-15 NOTE — Telephone Encounter (Signed)
Patient is calling for a referral for her neck and arm pain.

## 2013-02-16 NOTE — Telephone Encounter (Signed)
Fleet Contras have her call Dr. Hoy Register office to be seen by somebody in that group

## 2013-02-16 NOTE — Telephone Encounter (Signed)
Left message on machine for patient

## 2013-04-07 ENCOUNTER — Other Ambulatory Visit (INDEPENDENT_AMBULATORY_CARE_PROVIDER_SITE_OTHER): Payer: BC Managed Care – PPO

## 2013-04-07 DIAGNOSIS — Z0000 Encounter for general adult medical examination without abnormal findings: Secondary | ICD-10-CM

## 2013-04-07 LAB — CBC WITH DIFFERENTIAL/PLATELET
Basophils Absolute: 0 10*3/uL (ref 0.0–0.1)
Basophils Relative: 0.4 % (ref 0.0–3.0)
Eosinophils Absolute: 0.2 10*3/uL (ref 0.0–0.7)
Eosinophils Relative: 2.1 % (ref 0.0–5.0)
HCT: 37.3 % (ref 36.0–46.0)
Hemoglobin: 12.9 g/dL (ref 12.0–15.0)
Lymphocytes Relative: 24 % (ref 12.0–46.0)
Lymphs Abs: 2.8 10*3/uL (ref 0.7–4.0)
MCHC: 34.7 g/dL (ref 30.0–36.0)
MCV: 91.3 fl (ref 78.0–100.0)
Monocytes Absolute: 0.9 10*3/uL (ref 0.1–1.0)
Monocytes Relative: 7.5 % (ref 3.0–12.0)
Neutro Abs: 7.6 10*3/uL (ref 1.4–7.7)
Neutrophils Relative %: 66 % (ref 43.0–77.0)
Platelets: 324 10*3/uL (ref 150.0–400.0)
RBC: 4.08 Mil/uL (ref 3.87–5.11)
RDW: 12.9 % (ref 11.5–14.6)
WBC: 11.6 10*3/uL — ABNORMAL HIGH (ref 4.5–10.5)

## 2013-04-07 LAB — LIPID PANEL
Cholesterol: 224 mg/dL — ABNORMAL HIGH (ref 0–200)
HDL: 76.5 mg/dL (ref >39.00)
Total CHOL/HDL Ratio: 3
Triglycerides: 164 mg/dL — ABNORMAL HIGH (ref 0.0–149.0)
VLDL: 32.8 mg/dL (ref 0.0–40.0)

## 2013-04-07 LAB — BASIC METABOLIC PANEL
BUN: 12 mg/dL (ref 6–23)
CO2: 28 mEq/L (ref 19–32)
Calcium: 9.1 mg/dL (ref 8.4–10.5)
Chloride: 101 mEq/L (ref 96–112)
Creatinine, Ser: 0.8 mg/dL (ref 0.4–1.2)
GFR: 74.45 mL/min (ref >60.00)
Glucose, Bld: 101 mg/dL — ABNORMAL HIGH (ref 70–99)
Potassium: 4.1 mEq/L (ref 3.5–5.1)
Sodium: 139 mEq/L (ref 135–145)

## 2013-04-07 LAB — POCT URINALYSIS DIPSTICK
Bilirubin, UA: NEGATIVE
Glucose, UA: NEGATIVE
Ketones, UA: NEGATIVE
Leukocytes, UA: NEGATIVE
Nitrite, UA: NEGATIVE
Protein, UA: NEGATIVE
Spec Grav, UA: 1.02
Urobilinogen, UA: 0.2
pH, UA: 6.5

## 2013-04-07 LAB — HEPATIC FUNCTION PANEL
ALT: 20 U/L (ref 0–35)
AST: 18 U/L (ref 0–37)
Albumin: 3.9 g/dL (ref 3.5–5.2)
Alkaline Phosphatase: 56 U/L (ref 39–117)
Bilirubin, Direct: 0 mg/dL (ref 0.0–0.3)
Total Bilirubin: 0.8 mg/dL (ref 0.3–1.2)
Total Protein: 7.4 g/dL (ref 6.0–8.3)

## 2013-04-07 LAB — TSH: TSH: 0.85 u[IU]/mL (ref 0.35–5.50)

## 2013-04-07 LAB — LDL CHOLESTEROL, DIRECT: Direct LDL: 123 mg/dL

## 2013-04-12 ENCOUNTER — Encounter: Payer: Self-pay | Admitting: Family Medicine

## 2013-04-12 ENCOUNTER — Ambulatory Visit (INDEPENDENT_AMBULATORY_CARE_PROVIDER_SITE_OTHER): Payer: BC Managed Care – PPO | Admitting: Family Medicine

## 2013-04-12 VITALS — BP 120/80 | Temp 98.3°F | Ht 65.75 in | Wt 163.0 lb

## 2013-04-12 DIAGNOSIS — F411 Generalized anxiety disorder: Secondary | ICD-10-CM

## 2013-04-12 DIAGNOSIS — Z0000 Encounter for general adult medical examination without abnormal findings: Secondary | ICD-10-CM

## 2013-04-12 DIAGNOSIS — F410 Panic disorder [episodic paroxysmal anxiety] without agoraphobia: Secondary | ICD-10-CM

## 2013-04-12 DIAGNOSIS — M549 Dorsalgia, unspecified: Secondary | ICD-10-CM

## 2013-04-12 DIAGNOSIS — J309 Allergic rhinitis, unspecified: Secondary | ICD-10-CM

## 2013-04-12 DIAGNOSIS — M542 Cervicalgia: Secondary | ICD-10-CM

## 2013-04-12 MED ORDER — ALPRAZOLAM 0.25 MG PO TABS: 0.2500 mg | ORAL_TABLET | Freq: Every evening | ORAL | Status: DC | PRN

## 2013-04-12 MED ORDER — TRAMADOL HCL 50 MG PO TABS: ORAL_TABLET | ORAL | Status: DC

## 2013-04-12 MED ORDER — DIAZEPAM 2 MG PO TABS: ORAL_TABLET | ORAL | Status: DC

## 2013-04-12 MED ORDER — ZOLPIDEM TARTRATE 5 MG PO TABS: 2.5000 mg | ORAL_TABLET | Freq: Every evening | ORAL | Status: DC | PRN

## 2013-04-12 NOTE — Patient Instructions (Addendum)
If you have a flare of neck or back pain,,,,,,,,,,,,, Motrin 600 mg twice daily with food, tramadol and Valium,,,,,,,,,,,, one of each 3 times daily,,,,,,,,,, bed rest for 2 days  If it's mild you can use the Motrin 600 twice a day and the tramadol and Valium,,,,,,,,,,, one of each at bedtime  If you do this and it does not help then call and we'll get you set up for physical therapy  Walk 30 minutes daily  Return in one year for general physical exam sooner if any problem  Okay to stop all the over-the-counter supplements except one aspirin tablet daily

## 2013-04-12 NOTE — Progress Notes (Signed)
  Subjective:    Patient ID: Ellen Collins, female    DOB: 1951/10/15, 62 y.o.   MRN: [id number]  HPI Ezra is a 62 year old married female nonsmoker who comes in today for general physical examination because of a history of anxiety, estrogen deficiency, allergic rhinitis, sleep dysfunction, and degenerative disc disease cervical and lumbar  He has episodes where she has neck and lumbar discs pain return to come and go. She treats it symptomatically and he seemed to improve.  She had a bone density in 2011 it was normal. She is on estrogen due to her GYN 2 mg daily therefore a bone density will stay normal.  She gets routine eye care, dental care, BSE monthly, and you mammography, colonoscopy and GI, tetanus 2007, seasonal flu shot every fall, information given on shingles  She's had her uterus out for nonmalignant reasons recent pelvic exam by GYN normal   Review of Systems  Constitutional: Negative.   HENT: Negative.   Eyes: Negative.   Respiratory: Negative.   Cardiovascular: Negative.   Gastrointestinal: Negative.   Genitourinary: Negative.   Musculoskeletal: Negative.   Neurological: Negative.   Psychiatric/Behavioral: Negative.        Objective:   Physical Exam  Constitutional: She appears well-developed and well-nourished.  HENT:  Head: Normocephalic and atraumatic.  Right Ear: External ear normal.  Left Ear: External ear normal.  Nose: Nose normal.  Mouth/Throat: Oropharynx is clear and moist.  Eyes: EOM are normal. Pupils are equal, round, and reactive to light.  Neck: Normal range of motion. Neck supple. No thyromegaly present.  Cardiovascular: Normal rate, regular rhythm, normal heart sounds and intact distal pulses.  Exam reveals no gallop and no friction rub.   No murmur heard. Pulmonary/Chest: Effort normal and breath sounds normal.  Abdominal: Soft. Bowel sounds are normal. She exhibits no distension and no mass. There is no tenderness. There is no rebound.   Genitourinary:  Bilateral breast exam normal  Musculoskeletal: Normal range of motion.  Lymphadenopathy:    She has no cervical adenopathy.  Neurological: She is alert. She has normal reflexes. No cranial nerve deficit. She exhibits normal muscle tone. Coordination normal.  Skin: Skin is warm and dry.  Total body skin exam normal  Psychiatric: She has a normal mood and affect. Her behavior is normal. Judgment and thought content normal.          Assessment & Plan:  Healthy female  Anxiety Xanax 0.25 mg each bedtime when necessary,,,,,,,,, Shiley takes 3 or 4 tablets per month  Menopause on estrogen 2 mg daily followed by GYN she understands the negative effects of long-term hormones including an increased stroke heart attack and breast cancer  Allergic rhinitis continue Claritin and steroid nasal spray  Sleep dysfunction Ambien 5 mg when necessary  Degenerative disc disease cervical and lumbar,,,,,,,,, outlined program when necessary

## 2013-04-14 ENCOUNTER — Encounter: Payer: Self-pay | Admitting: *Deleted

## 2013-04-14 ENCOUNTER — Encounter: Payer: Self-pay | Admitting: Family Medicine

## 2013-04-17 ENCOUNTER — Ambulatory Visit (INDEPENDENT_AMBULATORY_CARE_PROVIDER_SITE_OTHER): Payer: BC Managed Care – PPO | Admitting: *Deleted

## 2013-04-17 DIAGNOSIS — Z23 Encounter for immunization: Secondary | ICD-10-CM

## 2013-04-17 DIAGNOSIS — Z2911 Encounter for prophylactic immunotherapy for respiratory syncytial virus (RSV): Secondary | ICD-10-CM

## 2013-04-21 ENCOUNTER — Encounter: Payer: Self-pay | Admitting: Family Medicine

## 2013-04-21 ENCOUNTER — Ambulatory Visit (INDEPENDENT_AMBULATORY_CARE_PROVIDER_SITE_OTHER): Payer: BC Managed Care – PPO | Admitting: Family Medicine

## 2013-04-21 VITALS — BP 138/78 | Temp 98.6°F

## 2013-04-21 DIAGNOSIS — B029 Zoster without complications: Secondary | ICD-10-CM

## 2013-04-21 MED ORDER — VALACYCLOVIR HCL 1 G PO TABS: 1000.0000 mg | ORAL_TABLET | Freq: Three times a day (TID) | ORAL | Status: DC

## 2013-04-21 NOTE — Patient Instructions (Addendum)
Shingles Shingles (herpes zoster) is an infection that is caused by the same virus that causes chickenpox (varicella). The infection causes a painful skin rash and fluid-filled blisters, which eventually break open, crust over, and heal. It may occur in any area of the body, but it usually affects only one side of the body or face. The pain of shingles usually lasts about 1 month. However, some people with shingles may develop long-term (chronic) pain in the affected area of the body. Shingles often occurs many years after the person had chickenpox. It is more common:  In people older than 50 years.  In people with weakened immune systems, such as those with HIV, AIDS, or cancer.  In people taking medicines that weaken the immune system, such as transplant medicines.  In people under great stress. CAUSES  Shingles is caused by the varicella zoster virus (VZV), which also causes chickenpox. After a person is infected with the virus, it can remain in the person's body for years in an inactive state (dormant). To cause shingles, the virus reactivates and breaks out as an infection in a nerve root. The virus can be spread from person to person (contagious) through contact with open blisters of the shingles rash. It will only spread to people who have not had chickenpox. When these people are exposed to the virus, they may develop chickenpox. They will not develop shingles. Once the blisters scab over, the person is no longer contagious and cannot spread the virus to others. SYMPTOMS  Shingles shows up in stages. The initial symptoms may be pain, itching, and tingling in an area of the skin. This pain is usually described as burning, stabbing, or throbbing.In a few days or weeks, a painful red rash will appear in the area where the pain, itching, and tingling were felt. The rash is usually on one side of the body in a band or belt-like pattern. Then, the rash usually turns into fluid-filled blisters. They  will scab over and dry up in approximately 2 3 weeks. Flu-like symptoms may also occur with the initial symptoms, the rash, or the blisters. These may include:  Fever.  Chills.  Headache.  Upset stomach. DIAGNOSIS  Your caregiver will perform a skin exam to diagnose shingles. Skin scrapings or fluid samples may also be taken from the blisters. This sample will be examined under a microscope or sent to a lab for further testing. TREATMENT  There is no specific cure for shingles. Your caregiver will likely prescribe medicines to help you manage the pain, recover faster, and avoid long-term problems. This may include antiviral drugs, anti-inflammatory drugs, and pain medicines. HOME CARE INSTRUCTIONS   Take a cool bath or apply cool compresses to the area of the rash or blisters as directed. This may help with the pain and itching.   Only take over-the-counter or prescription medicines as directed by your caregiver.   Rest as directed by your caregiver.  Keep your rash and blisters clean with mild soap and cool water or as directed by your caregiver.  Do not pick your blisters or scratch your rash. Apply an anti-itch cream or numbing creams to the affected area as directed by your caregiver.  Keep your shingles rash covered with a loose bandage (dressing).  Avoid skin contact with:  Babies.   Pregnant women.   Children with eczema.   Elderly people with transplants.   People with chronic illnesses, such as leukemia or AIDS.   Wear loose-fitting clothing to help ease   the pain of material rubbing against the rash.  Keep all follow-up appointments with your caregiver.If the area involved is on your face, you may receive a referral for follow-up to a specialist, such as an eye doctor (ophthalmologist) or an ear, nose, and throat (ENT) doctor. Keeping all follow-up appointments will help you avoid eye complications, chronic pain, or disability.  SEEK IMMEDIATE MEDICAL  CARE IF:   You have facial pain, pain around the eye area, or loss of feeling on one side of your face.  You have ear pain or ringing in your ear.  You have loss of taste.  Your pain is not relieved with prescribed medicines.   Your redness or swelling spreads.   You have more pain and swelling.  Your condition is worsening or has changed.   You have a feveror persistent symptoms for more than 2 3 days.  You have a fever and your symptoms suddenly get worse. MAKE SURE YOU:  Understand these instructions.  Will watch your condition.  Will get help right away if you are not doing well or get worse. Document Released: 10/26/2005 Document Revised: 07/20/2012 Document Reviewed: 06/09/2012 [employer] Patient Information 2014 [employer].  

## 2013-04-21 NOTE — Progress Notes (Signed)
  Subjective:    Patient ID: Ellen Collins, female    DOB: October 15, 1951, 62 y.o.   MRN: [id number]  HPI Painful rash left trunk thoracic region Patient actually had shingles vaccine couple days ago and started yesterday with rash. Burning stinging quality. Pain is rated 7/10 severity it improved with Motrin. No fevers or chills.  Past Medical History  Diagnosis Date  . Allergy   . Anxiety   . Paroxysmal SVT (supraventricular tachycardia)     she reports rare problems over years   Past Surgical History  Procedure Laterality Date  . Vaginal hysterectomy  1998  . Colonoscopy    . Upper gastrointestinal endoscopy      reports that she has never smoked. She has never used smokeless tobacco. She reports that she drinks about 0.6 ounces of alcohol per week. She reports that she does not use illicit drugs. family history includes Colon cancer in her father. Allergies  Allergen Reactions  . Latex       Review of Systems  Constitutional: Negative for fever and chills.  Respiratory: Negative for cough.   Skin: Positive for rash.       Objective:   Physical Exam  Constitutional: She appears well-developed and well-nourished.  Cardiovascular: Normal rate and regular rhythm.   Pulmonary/Chest: Effort normal and breath sounds normal. No respiratory distress. She has no wheezes. She has no rales.  Skin: Rash noted.  Patient has rash dermatome distribution erythematous base and slightly raised vesicular surface following midthoracic dermatome          Assessment & Plan:  Shingles. Valtrex 1 g 3 times a day for 7 days. She wishes to continue Motrin as needed for pain control. She'll let us know if this is not adequately controlling her pain

## 2013-05-04 ENCOUNTER — Encounter: Payer: Self-pay | Admitting: Family Medicine

## 2013-05-08 MED ORDER — PREDNISONE 20 MG PO TABS: 20.0000 mg | ORAL_TABLET | Freq: Every day | ORAL | Status: DC

## 2013-06-07 ENCOUNTER — Encounter: Payer: Self-pay | Admitting: Family Medicine

## 2013-06-08 ENCOUNTER — Other Ambulatory Visit: Payer: Self-pay | Admitting: Internal Medicine

## 2013-06-08 MED ORDER — GABAPENTIN 100 MG PO CAPS: 100.0000 mg | ORAL_CAPSULE | Freq: Three times a day (TID) | ORAL | Status: DC

## 2013-06-08 MED ORDER — TRAMADOL HCL 50 MG PO TABS: 50.0000 mg | ORAL_TABLET | Freq: Three times a day (TID) | ORAL | Status: DC | PRN

## 2013-06-08 NOTE — Telephone Encounter (Signed)
Please advise 

## 2013-07-20 ENCOUNTER — Ambulatory Visit (INDEPENDENT_AMBULATORY_CARE_PROVIDER_SITE_OTHER): Payer: BC Managed Care – PPO

## 2013-07-20 DIAGNOSIS — Z23 Encounter for immunization: Secondary | ICD-10-CM

## 2014-01-03 ENCOUNTER — Other Ambulatory Visit: Payer: Self-pay | Admitting: Dermatology

## 2014-08-13 ENCOUNTER — Ambulatory Visit (INDEPENDENT_AMBULATORY_CARE_PROVIDER_SITE_OTHER): Payer: BC Managed Care – PPO

## 2014-08-13 DIAGNOSIS — Z23 Encounter for immunization: Secondary | ICD-10-CM

## 2014-08-14 ENCOUNTER — Ambulatory Visit (INDEPENDENT_AMBULATORY_CARE_PROVIDER_SITE_OTHER)
Admission: RE | Admit: 2014-08-14 | Discharge: 2014-08-14 | Disposition: A | Discharge status: Home / Self Care | Payer: BC Managed Care – PPO | Source: Ambulatory Visit | Attending: Family Medicine | Admitting: Family Medicine

## 2014-08-14 ENCOUNTER — Ambulatory Visit (INDEPENDENT_AMBULATORY_CARE_PROVIDER_SITE_OTHER): Payer: BC Managed Care – PPO | Admitting: Family Medicine

## 2014-08-14 ENCOUNTER — Encounter: Payer: Self-pay | Admitting: Family Medicine

## 2014-08-14 ENCOUNTER — Ambulatory Visit: Payer: BC Managed Care – PPO | Admitting: Family Medicine

## 2014-08-14 VITALS — BP 140/90 | Temp 98.5°F | Wt 168.0 lb

## 2014-08-14 DIAGNOSIS — M5116 Intervertebral disc disorders with radiculopathy, lumbar region: Secondary | ICD-10-CM

## 2014-08-14 MED ORDER — CYCLOBENZAPRINE HCL 5 MG PO TABS: 5.0000 mg | ORAL_TABLET | Freq: Three times a day (TID) | ORAL | Status: DC | PRN

## 2014-08-14 MED ORDER — TRAMADOL HCL 50 MG PO TABS
50.0000 mg | ORAL_TABLET | Freq: Three times a day (TID) | ORAL | Status: DC | PRN
Start: 2014-08-14 — End: 2014-08-21

## 2014-08-14 MED ORDER — PREDNISONE 20 MG PO TABS: ORAL_TABLET | ORAL | Status: DC

## 2014-08-14 NOTE — Patient Instructions (Addendum)
Go to the main office now for your x-rays  Been to the pharmacy for your medications............ then home  Bedrest today Wednesday and Thursday............ Friday ambulate............... Walk.......... lie down........ no sitting  Flexeril and tramadol.......... one half of each 3 times daily while you're at bedrest  Friday when you begin ambulating........ Flexeril and tramadol one half of each at bedtime  Prednisone 20 mg......... 2x3 days taper as outlined  Return next Tuesday for followup  Hold the physical therapy

## 2014-08-14 NOTE — Progress Notes (Signed)
   Subjective:    Patient ID: Ellen Collins, female    DOB: 1951-02-28, 64 y.o.   MRN: [id number]  HPI Ellen Collins is a 63 year old married female nonsmoker who comes in today for evaluation of low back pain  She states she was fine until 3 weeks ago Tuesday when she awoke with severe sharp left-sided low back pain that radiates down to her lower calf. She had been on an airplane trip over the weekend. Came back on Sunday felt fine Monday felt fine but then Tuesday woke up with severe pain. She describes the pain as sharp, constant, a 6 on a scale of 1-10, and again radiates down to her left calf. The pain is increased by sitting and straining to have a bowel movement. It's decreased by rest. She went to physical therapy office on or around and has had 4 sessions and it seemed to help a little bit. She been taking Aleve OTC  Family history negative     Review of Systems No fever chills nausea vomiting diarrhea or urinary tract symptoms bowel symptoms night sweats etc.    Objective:   Physical Exam  Well-developed well-nourished female no acute distress vital signs stable she's afebrile examinations spine shows no palpable tenderness. She assumes a supine position with no discomfort. In the supine position both legs are of equal length. Sensation reflexes muscle strength straight leg raising on normal.      Assessment & Plan:  Lumbar disc disease L4-L5........Marland Kitchen without neurologic deficit....... treat symptomatically with medications rest and physical therapy

## 2014-08-21 ENCOUNTER — Ambulatory Visit (INDEPENDENT_AMBULATORY_CARE_PROVIDER_SITE_OTHER): Payer: BC Managed Care – PPO | Admitting: Family Medicine

## 2014-08-21 ENCOUNTER — Encounter: Payer: Self-pay | Admitting: Family Medicine

## 2014-08-21 VITALS — BP 130/80 | Temp 98.0°F | Wt 169.0 lb

## 2014-08-21 DIAGNOSIS — M5116 Intervertebral disc disorders with radiculopathy, lumbar region: Secondary | ICD-10-CM

## 2014-08-21 NOTE — Progress Notes (Signed)
   Subjective:    Patient ID: Ellen Collins, female    DOB: [birth year], 63 y.o.   MRN: [id number]  HPI Ellen Collins is a 63 year old female comes in today for followup of back pain  We saw her last week with severe back pain and both SI joints. We treated her with bed rest for 48 hours, prednisone burst and taper and she's down to a half a 20 mg a day, Flexeril and Vicodin 3 times a day now she is on Flexeril and tramadol just a half a tablet of each at bedtime.  She says her pain is down to a 2 and it no longer radiates down her legs.   Review of Systems Review of systems otherwise negative    Objective:   Physical Exam  Well-developed and nourished female no acute distress vital signs daily she's afebrile back exam normal except for still some palpable tenderness in that both SI joints neurologic exam otherwise normal      Assessment & Plan:  Lumbar disease improving with medication plan taper medication physical therapy consult

## 2014-08-21 NOTE — Progress Notes (Signed)
Pre visit review using our clinic review tool, if applicable. No additional management support is needed unless otherwise documented below in the visit note. 

## 2014-08-21 NOTE — Patient Instructions (Signed)
Taper the prednisone as outlined  1 she finished the prednisone completely then begin Motrin 600 mg twice daily with food  Flexeril and tramadol........... one half of each at bedtime when necessary  We will get you set up for physical therapy ASAP

## 2014-09-04 ENCOUNTER — Telehealth: Payer: Self-pay | Admitting: Family Medicine

## 2014-09-04 DIAGNOSIS — M5440 Lumbago with sciatica, unspecified side: Secondary | ICD-10-CM

## 2014-09-04 NOTE — Telephone Encounter (Signed)
Ortho per Dr Sherren Mocha.  Patient is aware and referral placed.

## 2014-09-04 NOTE — Telephone Encounter (Signed)
Pt states her back pain is not better and this am she is worse again. Pt is not getting any better. Pt wants to know if you think she should start another round of prednisone and pain meds. pls advise.

## 2014-09-07 ENCOUNTER — Other Ambulatory Visit: Payer: Self-pay | Admitting: Orthopaedic Surgery

## 2014-09-07 DIAGNOSIS — M545 Low back pain: Secondary | ICD-10-CM

## 2014-09-16 ENCOUNTER — Ambulatory Visit
Admission: RE | Admit: 2014-09-16 | Discharge: 2014-09-16 | Disposition: A | Discharge status: Home / Self Care | Payer: BC Managed Care – PPO | Source: Ambulatory Visit | Attending: Orthopaedic Surgery | Admitting: Orthopaedic Surgery

## 2014-09-16 DIAGNOSIS — M545 Low back pain: Secondary | ICD-10-CM

## 2014-09-17 ENCOUNTER — Other Ambulatory Visit: Payer: BC Managed Care – PPO

## 2014-09-25 ENCOUNTER — Other Ambulatory Visit: Payer: BC Managed Care – PPO

## 2014-09-27 ENCOUNTER — Other Ambulatory Visit (INDEPENDENT_AMBULATORY_CARE_PROVIDER_SITE_OTHER): Payer: BC Managed Care – PPO

## 2014-09-27 DIAGNOSIS — Z0000 Encounter for general adult medical examination without abnormal findings: Secondary | ICD-10-CM

## 2014-09-27 LAB — LDL CHOLESTEROL, DIRECT: Direct LDL: 152.2 mg/dL

## 2014-09-27 LAB — BASIC METABOLIC PANEL
BUN: 16 mg/dL (ref 6–23)
CO2: 28 mEq/L (ref 19–32)
Calcium: 9.4 mg/dL (ref 8.4–10.5)
Chloride: 99 mEq/L (ref 96–112)
Creatinine, Ser: 0.9 mg/dL (ref 0.4–1.2)
GFR: 69.25 mL/min (ref >60.00)
Glucose, Bld: 102 mg/dL — ABNORMAL HIGH (ref 70–99)
Potassium: 4.3 mEq/L (ref 3.5–5.1)
Sodium: 138 mEq/L (ref 135–145)

## 2014-09-27 LAB — LIPID PANEL
Cholesterol: 260 mg/dL — ABNORMAL HIGH (ref 0–200)
HDL: 73.6 mg/dL (ref >39.00)
NonHDL: 186.4
Total CHOL/HDL Ratio: 4
Triglycerides: 284 mg/dL — ABNORMAL HIGH (ref 0.0–149.0)
VLDL: 56.8 mg/dL — ABNORMAL HIGH (ref 0.0–40.0)

## 2014-09-27 LAB — POCT URINALYSIS DIPSTICK
Glucose, UA: NEGATIVE
Ketones, UA: NEGATIVE
Leukocytes, UA: NEGATIVE
Nitrite, UA: NEGATIVE
Spec Grav, UA: >=1.03
Urobilinogen, UA: 0.2
pH, UA: 5.5

## 2014-09-27 LAB — HEPATIC FUNCTION PANEL
ALT: 19 U/L (ref 0–35)
AST: 17 U/L (ref 0–37)
Albumin: 4.3 g/dL (ref 3.5–5.2)
Alkaline Phosphatase: 47 U/L (ref 39–117)
Bilirubin, Direct: 0 mg/dL (ref 0.0–0.3)
Total Bilirubin: 0.6 mg/dL (ref 0.2–1.2)
Total Protein: 7.3 g/dL (ref 6.0–8.3)

## 2014-09-27 LAB — CBC WITH DIFFERENTIAL/PLATELET
Basophils Absolute: 0 10*3/uL (ref 0.0–0.1)
Basophils Relative: 0.4 % (ref 0.0–3.0)
Eosinophils Absolute: 0.1 10*3/uL (ref 0.0–0.7)
Eosinophils Relative: 1.8 % (ref 0.0–5.0)
HCT: 40.1 % (ref 36.0–46.0)
Hemoglobin: 13.4 g/dL (ref 12.0–15.0)
Lymphocytes Relative: 35.7 % (ref 12.0–46.0)
Lymphs Abs: 2.8 10*3/uL (ref 0.7–4.0)
MCHC: 33.4 g/dL (ref 30.0–36.0)
MCV: 93.7 fl (ref 78.0–100.0)
Monocytes Absolute: 0.6 10*3/uL (ref 0.1–1.0)
Monocytes Relative: 8.1 % (ref 3.0–12.0)
Neutro Abs: 4.2 10*3/uL (ref 1.4–7.7)
Neutrophils Relative %: 54 % (ref 43.0–77.0)
Platelets: 352 10*3/uL (ref 150.0–400.0)
RBC: 4.28 Mil/uL (ref 3.87–5.11)
RDW: 12.9 % (ref 11.5–15.5)
WBC: 7.7 10*3/uL (ref 4.0–10.5)

## 2014-09-27 LAB — TSH: TSH: 2.66 u[IU]/mL (ref 0.35–4.50)

## 2014-10-02 ENCOUNTER — Ambulatory Visit (INDEPENDENT_AMBULATORY_CARE_PROVIDER_SITE_OTHER): Payer: BC Managed Care – PPO | Admitting: Family Medicine

## 2014-10-02 ENCOUNTER — Encounter: Payer: Self-pay | Admitting: Family Medicine

## 2014-10-02 VITALS — BP 130/80 | Temp 98.4°F | Ht 66.0 in | Wt 169.0 lb

## 2014-10-02 DIAGNOSIS — F411 Generalized anxiety disorder: Secondary | ICD-10-CM

## 2014-10-02 DIAGNOSIS — J309 Allergic rhinitis, unspecified: Secondary | ICD-10-CM

## 2014-10-02 DIAGNOSIS — F410 Panic disorder [episodic paroxysmal anxiety] without agoraphobia: Secondary | ICD-10-CM

## 2014-10-02 MED ORDER — ZOLPIDEM TARTRATE 5 MG PO TABS: 2.5000 mg | ORAL_TABLET | Freq: Every evening | ORAL | Status: DC | PRN

## 2014-10-02 MED ORDER — ALPRAZOLAM 0.25 MG PO TABS: 0.2500 mg | ORAL_TABLET | Freq: Every evening | ORAL | Status: DC | PRN

## 2014-10-02 NOTE — Progress Notes (Signed)
Subjective:    Patient ID: Ellen Collins, female    DOB: 1951/05/03, 63 y.o.   MRN: [id number]  HPI Ellen Collins is a 63 year old married female nonsmoker who comes in today for general physical examination  She had her uterus and ovaries removed many years ago and tells me she still sees her GYN on a regular basis for evaluation of ovarian cancer. I explained to her that since she doesn't have a uterus or ovaries that she's not, get ovarian cancer however she still goes??????????  She gets routine eye care, dental care, BSE monthly, annual mammography, colonoscopy and GI every 5 years because of a positive family history of colon cancer  Vaccinations updated by Apolonio Schneiders  She takes a 0.25 mg of Xanax may be 1 pill a month because of a panic attack  She takes Ambien 5 mg dose one half tab daily at bedtime when necessary for sleep dysfunction  We saw her couple months ago with severe back pain. Conservative therapy failed to relieve her pain. We therefore send her to physical therapy which failed also to relieve her pain. She then went to see the neurosurgeons. They did an injection and her pain is markedly diminished   Review of Systems  Constitutional: Negative.   HENT: Negative.   Eyes: Negative.   Respiratory: Negative.   Cardiovascular: Negative.   Gastrointestinal: Negative.   Endocrine: Negative.   Genitourinary: Negative.   Musculoskeletal: Negative.   Skin: Negative.   Allergic/Immunologic: Negative.   Neurological: Negative.   Hematological: Negative.   Psychiatric/Behavioral: Negative.        Objective:   Physical Exam  Constitutional: She is oriented to person, place, and time. She appears well-developed and well-nourished.  HENT:  Head: Normocephalic and atraumatic.  Right Ear: External ear normal.  Left Ear: External ear normal.  Nose: Nose normal.  Mouth/Throat: Oropharynx is clear and moist.  Eyes: EOM are normal. Pupils are equal, round, and reactive to light.    Neck: Normal range of motion. Neck supple. No JVD present. No tracheal deviation present. No thyromegaly present.  Cardiovascular: Normal rate, regular rhythm, normal heart sounds and intact distal pulses.  Exam reveals no gallop and no friction rub.   No murmur heard. Pulmonary/Chest: Effort normal and breath sounds normal. No stridor. No respiratory distress. She has no wheezes. She has no rales. She exhibits no tenderness.  Abdominal: Soft. Bowel sounds are normal. She exhibits no distension and no mass. There is no tenderness. There is no rebound and no guarding.  Genitourinary:  Bilateral breast exam normal  She had her uterus and ovaries removed for nonmalignant reasons therefore pelvic exams no longer indicated however she still sees her GYN yearly  Musculoskeletal: Normal range of motion.  Lymphadenopathy:    She has no cervical adenopathy.  Neurological: She is alert and oriented to person, place, and time. She has normal reflexes. No cranial nerve deficit. She exhibits normal muscle tone. Coordination normal.  Skin: Skin is warm and dry. No rash noted. No erythema. No pallor.  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 [place] her dermatologist. She has light skin and not light eyes  Psychiatric: She has a normal mood and affect. Her behavior is normal. Judgment and thought content normal.  Nursing note and vitals reviewed.         Assessment & Plan:  Healthy female  History of panic attacks,,,,,,,, Xanax 0.25  when necessary,,,,,,, only takes 1 pill monthly  Sleep dysfunction,,,,,, Ambien 5 mg,,,,,,, one half tab daily at bedtime when necessary

## 2014-10-02 NOTE — Progress Notes (Signed)
Pre visit review using our clinic review tool, if applicable. No additional management support is needed unless otherwise documented below in the visit note. 

## 2014-10-02 NOTE — Patient Instructions (Signed)
Continue your good health habits and daily exercise  Remember those sunscreens,,,,,,,, SPF 50+  Return in one year for general physical exam sooner if any problems

## 2015-03-22 ENCOUNTER — Encounter: Payer: Self-pay | Admitting: *Deleted

## 2015-04-30 ENCOUNTER — Encounter: Payer: Self-pay | Admitting: Internal Medicine

## 2015-04-30 ENCOUNTER — Encounter: Payer: Self-pay | Admitting: Gastroenterology

## 2015-08-16 ENCOUNTER — Ambulatory Visit (INDEPENDENT_AMBULATORY_CARE_PROVIDER_SITE_OTHER): Payer: BLUE CROSS/BLUE SHIELD | Admitting: *Deleted

## 2015-08-16 DIAGNOSIS — Z23 Encounter for immunization: Secondary | ICD-10-CM

## 2015-12-18 ENCOUNTER — Encounter: Payer: Self-pay | Admitting: Adult Health

## 2015-12-18 ENCOUNTER — Ambulatory Visit (INDEPENDENT_AMBULATORY_CARE_PROVIDER_SITE_OTHER): Payer: BLUE CROSS/BLUE SHIELD | Admitting: Adult Health

## 2015-12-18 VITALS — BP 138/80 | HR 108 | Temp 98.2°F | Wt 174.1 lb

## 2015-12-18 DIAGNOSIS — J0110 Acute frontal sinusitis, unspecified: Secondary | ICD-10-CM

## 2015-12-18 DIAGNOSIS — H6122 Impacted cerumen, left ear: Secondary | ICD-10-CM

## 2015-12-18 MED ORDER — BETAMETHASONE VALERATE 0.1 % EX LOTN: 1.0000 "application " | TOPICAL_LOTION | Freq: Two times a day (BID) | CUTANEOUS | Status: DC

## 2015-12-18 MED ORDER — AMOXICILLIN-POT CLAVULANATE 875-125 MG PO TABS: 1.0000 | ORAL_TABLET | Freq: Two times a day (BID) | ORAL | Status: DC

## 2015-12-18 MED ORDER — METHYLPREDNISOLONE 4 MG PO TBPK: ORAL_TABLET | ORAL | Status: DC

## 2015-12-18 NOTE — Progress Notes (Addendum)
Subjective:    Patient ID: Ellen Collins, female    DOB: [birth year], 65 y.o.   MRN: [id number]  HPI  65 year old female who presents to the office today for sinus pain and pressure to left side of her face since Christmas. She endorses dizziness, sinus pain and pressure,  ear pain, and headache- all on left side.   She denies any drainage, or fever  ClartitinD and mucinex without relief.   Review of Systems  Constitutional: Negative.   HENT: Positive for congestion, ear pain, hearing loss, rhinorrhea, sinus pressure and sore throat. Negative for ear discharge.   Eyes: Positive for pain.  Respiratory: Negative.  Negative for cough, shortness of breath and wheezing.   Cardiovascular: Negative.   Neurological: Positive for dizziness and headaches. Negative for weakness.  All other systems reviewed and are negative.  Past Medical History  Diagnosis Date  . Allergy   . Anxiety   . Paroxysmal SVT (supraventricular tachycardia) ([place])     she reports rare problems over years    Social History   Social History  . Marital Status: Married    Spouse Name: N/A  . Number of Children: N/A  . Years of Education: N/A   Occupational History  . Not on file.   Social History Main Topics  . Smoking status: Never Smoker   . Smokeless tobacco: Never Used  . Alcohol Use: 0.6 oz/week    1 Glasses of wine per week  . Drug Use: No  . Sexual Activity: Not on file   Other Topics Concern  . Not on file   Social History Narrative    Past Surgical History  Procedure Laterality Date  . Vaginal hysterectomy  1998  . Colonoscopy    . Upper gastrointestinal endoscopy      Family History  Problem Relation Age of Onset  . Colon cancer Father     Allergies  Allergen Reactions  . Latex     Current Outpatient Prescriptions on File Prior to Visit  Medication Sig Dispense Refill  . ALPRAZolam (XANAX) 0.25 MG tablet Take 1 tablet (0.25 mg total) by mouth at bedtime as needed. 30 tablet  1  . betamethasone valerate lotion (VALISONE) 0.1 % Apply 1 application topically 2 (two) times daily. 60 mL 4  . cholecalciferol (VITAMIN D) 1000 UNITS tablet Take 1,000 Units by mouth daily.    . cyclobenzaprine (FLEXERIL) 5 MG tablet Take 1 tablet (5 mg total) by mouth 3 (three) times daily as needed for muscle spasms. 50 tablet 1  . estradiol (ESTRACE) 2 MG tablet Take 2 mg by mouth daily. Takes 1mg  and 2mg  alternate days    . loratadine (CLARITIN) 10 MG tablet Take 10 mg by mouth as needed. allergies    . Multiple Vitamins-Minerals (MULTIVITAMIN WITH MINERALS) tablet Take 1 tablet by mouth daily.    . Omega-3 Fatty Acids (FISH OIL) 1000 MG CAPS Take 1 capsule by mouth daily.     No current facility-administered medications on file prior to visit.    BP 138/80 mmHg  Pulse 108  Temp(Src) 98.2 F (36.8 C) (Oral)  Wt 174 lb 1.6 oz (78.971 kg)       Objective:   Physical Exam  Constitutional: She is oriented to person, place, and time. She appears well-developed and well-nourished. No distress.  HENT:  Head: Normocephalic and atraumatic.  Right Ear: External ear normal.  Left Ear: External ear normal.  Nose: Nose normal.  Mouth/Throat: Oropharynx is  clear and moist. No oropharyngeal exudate.  Cerumen Impaction in left ear. Mucosal edema in bilateral nares  Eyes: Conjunctivae and EOM are normal. Pupils are equal, round, and reactive to light. Right eye exhibits no discharge. Left eye exhibits no discharge. No scleral icterus.  Neck: Normal range of motion. Neck supple. No thyromegaly present.  Cardiovascular: Normal rate, regular rhythm, normal heart sounds and intact distal pulses.  Exam reveals no gallop and no friction rub.   No murmur heard. Pulmonary/Chest: Effort normal and breath sounds normal. No respiratory distress. She has no wheezes. She has no rales. She exhibits no tenderness.  Lymphadenopathy:    She has no cervical adenopathy.  Neurological: She is alert and  oriented to person, place, and time.  Skin: Skin is warm and dry. No rash noted. She is not diaphoretic. No erythema. No pallor.  Psychiatric: She has a normal mood and affect. Her behavior is normal. Judgment and thought content normal.  Nursing note and vitals reviewed.      Assessment & Plan:  1. Acute frontal sinusitis, recurrence not specified  - amoxicillin-clavulanate (AUGMENTIN) 875-125 MG tablet; Take 1 tablet by mouth 2 (two) times daily.  Dispense: 20 tablet; Refill: 0 - methylPREDNISolone (MEDROL DOSEPAK) 4 MG TBPK tablet; Take as directed  Dispense: 21 tablet; Refill: 0 - Follow up if no improvement in 2-3 days 2. Cerumen impaction, left - Cerumen impaction dislodged with irrigation - betamethasone valerate lotion (VALISONE) 0.1 %; Apply 1 application topically 2 (two) times daily.  Dispense: 60 mL; Refill: 4

## 2015-12-18 NOTE — Progress Notes (Signed)
Pre visit review using our clinic review tool, if applicable. No additional management support is needed unless otherwise documented below in the visit note. 

## 2015-12-18 NOTE — Patient Instructions (Addendum)
I am sorry you are feeling so badly.   I have sent in a prescription for Augmentin, take this twice a day for 10 days as well as prednisone  Please follow up if no improvement in the next 2-3 days.   Sinusitis, Adult Sinusitis is redness, soreness, and inflammation of the paranasal sinuses. Paranasal sinuses are air pockets within the bones of your face. They are located beneath your eyes, in the middle of your forehead, and above your eyes. In healthy paranasal sinuses, mucus is able to drain out, and air is able to circulate through them by way of your nose. However, when your paranasal sinuses are inflamed, mucus and air can become trapped. This can allow bacteria and other germs to grow and cause infection. Sinusitis can develop quickly and last only a short time (acute) or continue over a long period (chronic). Sinusitis that lasts for more than 12 weeks is considered chronic. CAUSES Causes of sinusitis include:  Allergies.  Structural abnormalities, such as displacement of the cartilage that separates your nostrils (deviated septum), which can decrease the air flow through your nose and sinuses and affect sinus drainage.  Functional abnormalities, such as when the small hairs (cilia) that line your sinuses and help remove mucus do not work properly or are not present. SIGNS AND SYMPTOMS Symptoms of acute and chronic sinusitis are the same. The primary symptoms are pain and pressure around the affected sinuses. Other symptoms include:  Upper toothache.  Earache.  Headache.  Bad breath.  Decreased sense of smell and taste.  A cough, which worsens when you are lying flat.  Fatigue.  Fever.  Thick drainage from your nose, which often is green and may contain pus (purulent).  Swelling and warmth over the affected sinuses. DIAGNOSIS Your health care provider will perform a physical exam. During your exam, your health care provider may perform any of the following to help  determine if you have acute sinusitis or chronic sinusitis:  Look in your nose for signs of abnormal growths in your nostrils (nasal polyps).  Tap over the affected sinus to check for signs of infection.  View the inside of your sinuses using an imaging device that has a light attached (endoscope). If your health care provider suspects that you have chronic sinusitis, one or more of the following tests may be recommended:  Allergy tests.  Nasal culture. A sample of mucus is taken from your nose, sent to a lab, and screened for bacteria.  Nasal cytology. A sample of mucus is taken from your nose and examined by your health care provider to determine if your sinusitis is related to an allergy. TREATMENT Most cases of acute sinusitis are related to a viral infection and will resolve on their own within 10 days. Sometimes, medicines are prescribed to help relieve symptoms of both acute and chronic sinusitis. These may include pain medicines, decongestants, nasal steroid sprays, or saline sprays. However, for sinusitis related to a bacterial infection, your health care provider will prescribe antibiotic medicines. These are medicines that will help kill the bacteria causing the infection. Rarely, sinusitis is caused by a fungal infection. In these cases, your health care provider will prescribe antifungal medicine. For some cases of chronic sinusitis, surgery is needed. Generally, these are cases in which sinusitis recurs more than 3 times per year, despite other treatments. HOME CARE INSTRUCTIONS  Drink plenty of water. Water helps thin the mucus so your sinuses can drain more easily.  Use a humidifier.  Inhale steam 3-4 times a day (for example, sit in the bathroom with the shower running).  Apply a warm, moist washcloth to your face 3-4 times a day, or as directed by your health care provider.  Use saline nasal sprays to help moisten and clean your sinuses.  Take medicines only as  directed by your health care provider.  If you were prescribed either an antibiotic or antifungal medicine, finish it all even if you start to feel better. SEEK IMMEDIATE MEDICAL CARE IF:  You have increasing pain or severe headaches.  You have nausea, vomiting, or drowsiness.  You have swelling around your face.  You have vision problems.  You have a stiff neck.  You have difficulty breathing.   This information is not intended to replace advice given to you by your health care provider. Make sure you discuss any questions you have with your health care provider.   Document Released: 10/26/2005 Document Revised: 11/16/2014 Document Reviewed: 11/10/2011 Elsevier Interactive Patient Education [employer].

## 2015-12-19 ENCOUNTER — Telehealth: Payer: Self-pay | Admitting: Adult Health

## 2015-12-19 ENCOUNTER — Other Ambulatory Visit: Payer: Self-pay | Admitting: Adult Health

## 2015-12-19 MED ORDER — AMOXICILLIN 250 MG/5ML PO SUSR: 500.0000 mg | Freq: Two times a day (BID) | ORAL | Status: DC

## 2015-12-19 NOTE — Telephone Encounter (Signed)
Amoxicillin liquid sent in, take 10 mg (500mg ) twice a day for 7 days

## 2015-12-19 NOTE — Telephone Encounter (Signed)
Left a message for return call.  

## 2015-12-19 NOTE — Telephone Encounter (Signed)
Please advise 

## 2015-12-19 NOTE — Telephone Encounter (Signed)
Called and left a vm for pt that rx was sent to pharmacy.

## 2015-12-19 NOTE — Telephone Encounter (Signed)
Patient Name: Ellen Collins  DOB: [birth year]    Initial Comment Caller states she is not sick, but some medication she was given she cannot swallow. She's even cut it in two and cannot swallow it.    Nurse Assessment  Nurse: Orvan Seen, RN, Jacquilin Date/Time (Eastern Time): 12/19/2015 8:41:02 AM  Confirm and document reason for call. If symptomatic, describe symptoms. You must click the next button to save text entered. ---Caller states she is not sick, but some medication she was given she cannot swallow. She's even cut it in two and cannot swallow it.- Caller states she has issues with pills and has attempted to crush/ break it/ and eat it with apple sauce and cannot take it. Sinus symptoms are the same. Caller is requesting a liquid medication  Has the patient traveled out of the country within the last 30 days? ---No  Does the patient have any new or worsening symptoms? ---Yes  Will a triage be completed? ---Yes  Related visit to physician within the last 2 weeks? ---Yes  Does the PT have any chronic conditions? (i.e. diabetes, asthma, etc.) ---Yes  List chronic conditions. ---svt  Is this a behavioral health or substance abuse call? ---No     Guidelines    Guideline Title Affirmed Question Affirmed Notes  Sinus Infection on Antibiotic Follow-up Call [1] Taking antibiotic < 72 hours (3 days) AND [2] sinus pain not improved (all triage questions negative)    Final Disposition User   [facility], RN, Jacquilin    Comments  PLEASE CALL PATIENT BACK REGARDING A CHANGE IN MEDICATION   Disagree/Comply: Comply

## 2016-08-12 ENCOUNTER — Ambulatory Visit (INDEPENDENT_AMBULATORY_CARE_PROVIDER_SITE_OTHER): Payer: BLUE CROSS/BLUE SHIELD | Admitting: Emergency Medicine

## 2016-08-12 DIAGNOSIS — Z23 Encounter for immunization: Secondary | ICD-10-CM

## 2016-09-22 ENCOUNTER — Other Ambulatory Visit (INDEPENDENT_AMBULATORY_CARE_PROVIDER_SITE_OTHER): Payer: Self-pay | Admitting: Physical Medicine and Rehabilitation

## 2016-11-13 ENCOUNTER — Encounter: Payer: Self-pay | Admitting: Family Medicine

## 2016-11-13 ENCOUNTER — Ambulatory Visit (INDEPENDENT_AMBULATORY_CARE_PROVIDER_SITE_OTHER): Payer: BLUE CROSS/BLUE SHIELD | Admitting: Family Medicine

## 2016-11-13 VITALS — BP 132/78 | HR 87 | Resp 12 | Ht 66.0 in | Wt 174.5 lb

## 2016-11-13 DIAGNOSIS — M5116 Intervertebral disc disorders with radiculopathy, lumbar region: Secondary | ICD-10-CM | POA: Diagnosis not present

## 2016-11-13 DIAGNOSIS — G4700 Insomnia, unspecified: Secondary | ICD-10-CM | POA: Insufficient documentation

## 2016-11-13 DIAGNOSIS — E782 Mixed hyperlipidemia: Secondary | ICD-10-CM | POA: Diagnosis not present

## 2016-11-13 DIAGNOSIS — R739 Hyperglycemia, unspecified: Secondary | ICD-10-CM

## 2016-11-13 LAB — LIPID PANEL
Cholesterol: 223 mg/dL — ABNORMAL HIGH (ref 0–200)
HDL: 60.9 mg/dL (ref >39.00)
LDL Cholesterol: 123 mg/dL — ABNORMAL HIGH (ref 0–99)
NonHDL: 162.48
Total CHOL/HDL Ratio: 4
Triglycerides: 198 mg/dL — ABNORMAL HIGH (ref 0.0–149.0)
VLDL: 39.6 mg/dL (ref 0.0–40.0)

## 2016-11-13 LAB — BASIC METABOLIC PANEL
BUN: 18 mg/dL (ref 6–23)
CO2: 29 mEq/L (ref 19–32)
Calcium: 9.6 mg/dL (ref 8.4–10.5)
Chloride: 100 mEq/L (ref 96–112)
Creatinine, Ser: 0.84 mg/dL (ref 0.40–1.20)
GFR: 72.56 mL/min (ref >60.00)
Glucose, Bld: 107 mg/dL — ABNORMAL HIGH (ref 70–99)
Potassium: 4.3 mEq/L (ref 3.5–5.1)
Sodium: 139 mEq/L (ref 135–145)

## 2016-11-13 LAB — HEMOGLOBIN A1C: Hgb A1c MFr Bld: 6.1 % (ref 4.6–6.5)

## 2016-11-13 LAB — TSH: TSH: 1.61 u[IU]/mL (ref 0.35–4.50)

## 2016-11-13 MED ORDER — GABAPENTIN 100 MG PO CAPS: 100.0000 mg | ORAL_CAPSULE | Freq: Three times a day (TID) | ORAL | 1 refills | Status: DC

## 2016-11-13 NOTE — Patient Instructions (Addendum)
A few things to remember from today's visit:   Lumbar disc disease with radiculopathy - Plan: gabapentin (NEURONTIN) 100 MG capsule  Hyperglycemia - Plan: Lipid panel, Basic metabolic panel, Hemoglobin A1c  Mixed hyperlipidemia  Insomnia, unspecified type - Plan: gabapentin (NEURONTIN) 100 MG capsule     ? What can I do to sleep better?   Improving your sleep habits is a good start.  Medical or psychiatric conditions might be making your insomnia worse.  Medicine might help, but you shouldn't use sleeping pills long term.   Some people need more sleep than others.   Sleep usually occurs in two- to three-hour cycles, so it is important to get at least three uninterrupted hours of sleep.  The following tips can help you develop better sleep habits: Go to bed and wake up at the same time each day Lie down to sleep only when sleepy. If you can't sleep after 20 minutes, get out of bed and go to another room; return to the bedroom when you are tired; repeat as necessary. Use bedroom for sleep and sex only. Don't do things in bed that might keep you awake, like watching television, reading, talking on the phone, or worrying Avoid caffeine, nicotine, or alcohol for at least four to six hours beforebedtime\ls1Avoid strenuous exercise within four hours of bedtime. Avoid daytime napping. Relax before going to bed. Avoid eating large meals or drinking a lot of water or other liquids in the evening. Keep the bedroom a comfortable temperature. Use earplugs if noise is a problem. Expose yourself to daytime light for at least 30 minutes each morning  Please be sure medication list is accurate. If a new problem present, please set up appointment sooner than planned today.

## 2016-11-13 NOTE — Progress Notes (Signed)
Pre visit review using our clinic review tool, if applicable. No additional management support is needed unless otherwise documented below in the visit note. 

## 2016-11-13 NOTE — Progress Notes (Signed)
HPI:   Ms.Ellen Collins is a 66 y.o. female, who is here today to establish care with me.  Former PCP: Dr Sherren Mocha Last preventive routine visit: 2015 She follows with her gyn, Dr Melba Coon. She is on hormonal therapy.  She also follows with dermatologists regularly, reports that she just had a "skin scan" and no suspicious lesion found.   Hx of lumbar spine stenosis, anxiety, HLD, and insomnia among some.  She is not exercising because lower back pain but follows a healthy diet.  Concerns today:  Lumbar back pain:  C/O "very and very painful" lower back, in the morning mainly and better after moving. She is reporting Hx of spinal stenosis.  Pain is radiated to both LE's, described as a "heavy" sensation on posterior aspect of thighs, no numbness or tingling. She denies saddle anesthesia or urine/bowel incontinence.  She tells me that has periodic spinal injections, which usually help for about 2 months. Pain is exacerbated by prolonged walking or sitting. Alleviated by rest or position changes.  She takes Ibuprofen 400 mg tid as needed.  She follows with ortho and according to pt, surgery has been recommended but she is not interested in doing so. She also has intermittent cervical pain.   -BS elevated, according to patient, she checked her glucose with her husband's glucometer and glucose was 180. She denies any history of diabetes, she has not eating since last night because she would like to have diabetes screening.    Hyperlipidemia: Currently on non pharmacologic treatment.  Lab Results  Component Value Date   CHOL 260 (H) 09/27/2014   HDL 73.60 09/27/2014   LDLDIRECT 152.2 09/27/2014   TRIG 284.0 (H) 09/27/2014   CHOLHDL 4 09/27/2014    Insomnia: She is currently on Ambien, which has been prescribed by her gynecologist. She has trouble staying asleep, usually she wakes up around 3 am and cannot go back to sleep. She tries not to take Ambien very  frequent because it causes day time drowsiness.  She states that she has tried "everything" OTC. Hx of anxiety, she was on Alprazolam in the past.   Lab Results  Component Value Date   TSH 2.66 09/27/2014   Hx of tachycardia, reporting extensive cardiac work up and negative. She tells me that she used to take antibiotic before dental procedures but cardiologist discontinued it a few years ago. She still has occasional palpitations, not related with exertion. She denies associated dizziness,chest pain, dyspnea, or diaphoresis.    Review of Systems  Constitutional: Positive for fatigue. Negative for activity change, appetite change and fever.  HENT: Negative for mouth sores, nosebleeds and trouble swallowing.   Eyes: Negative for pain and visual disturbance.  Respiratory: Negative for cough, shortness of breath and wheezing.   Cardiovascular: Negative for chest pain, palpitations and leg swelling.  Gastrointestinal: Negative for abdominal pain, nausea and vomiting.       Negative for changes in bowel habits.  Endocrine: Negative for cold intolerance, heat intolerance, polydipsia, polyphagia and polyuria.  Genitourinary: Negative for decreased urine volume, difficulty urinating and hematuria.  Musculoskeletal: Positive for back pain. Negative for joint swelling.  Skin: Negative for rash.  Neurological: Negative for syncope, weakness and headaches.  Hematological: Negative for adenopathy. Does not bruise/bleed easily.  Psychiatric/Behavioral: Positive for sleep disturbance. Negative for confusion. The patient is nervous/anxious.       Current Outpatient Prescriptions on File Prior to Visit  Medication Sig Dispense Refill  . cholecalciferol (  VITAMIN D) 1000 UNITS tablet Take 1,000 Units by mouth daily.    Marland Kitchen loratadine (CLARITIN) 10 MG tablet Take 10 mg by mouth as needed. allergies    . Multiple Vitamins-Minerals (MULTIVITAMIN WITH MINERALS) tablet Take 1 tablet by mouth daily.    .  Omega-3 Fatty Acids (FISH OIL) 1000 MG CAPS Take 1 capsule by mouth daily.     No current facility-administered medications on file prior to visit.      Past Medical History:  Diagnosis Date  . Allergy   . Anxiety   . Paroxysmal SVT (supraventricular tachycardia) ([place])    she reports rare problems over years   Allergies  Allergen Reactions  . Latex     Family History  Problem Relation Age of Onset  . Colon cancer Father     Social History   Social History  . Marital status: Married    Spouse name: N/A  . Number of children: N/A  . Years of education: N/A   Social History Main Topics  . Smoking status: Never Smoker  . Smokeless tobacco: Never Used  . Alcohol use 0.6 oz/week    1 Glasses of wine per week  . Drug use: No  . Sexual activity: Not Asked   Other Topics Concern  . None   Social History Narrative  . None    Vitals:   11/13/16 0754  BP: 132/78  Pulse: 87  Resp: 12   O2 sat at RA 97%  Body mass index is 28.17 kg/m.    Physical Exam  Nursing note and vitals reviewed. Constitutional: She is oriented to person, place, and time. She appears well-developed. No distress.  HENT:  Head: Atraumatic.  Mouth/Throat: Oropharynx is clear and moist and mucous membranes are normal.  Eyes: Conjunctivae and EOM are normal. Pupils are equal, round, and reactive to light.  Neck: No tracheal deviation present. No thyroid mass and no thyromegaly present.  Cardiovascular: Normal rate and regular rhythm.   Murmur (soft SEM LUSB) heard. Pulses:      Dorsalis pedis pulses are 2+ on the right side, and 2+ on the left side.  Respiratory: Effort normal and breath sounds normal. No respiratory distress.  GI: Soft. She exhibits no mass. There is no hepatomegaly. There is no tenderness.  Musculoskeletal: She exhibits no edema.       Lumbar back: She exhibits no tenderness and no bony tenderness.  Knee crepitus bilateral, no pain elicited, normal ROM.     Neurological: She is alert and oriented to person, place, and time. She has normal strength. Coordination and gait normal.  Reflex Scores:      Patellar reflexes are 2+ on the right side and 2+ on the left side. SLR negative bilateral.  Skin: Skin is warm. No erythema.  Psychiatric: Her mood appears anxious. Cognition and memory are normal.  Well groomed, good eye contact.      ASSESSMENT AND PLAN:     Ellen Collins was seen today for establish care.  Diagnoses and all orders for this visit:   Lab Results  Component Value Date   HGBA1C 6.1 11/13/2016   Lab Results  Component Value Date   CREATININE 0.84 11/13/2016   BUN 18 11/13/2016   NA 139 11/13/2016   K 4.3 11/13/2016   CL 100 11/13/2016   CO2 29 11/13/2016   Lab Results  Component Value Date   CHOL 223 (H) 11/13/2016   HDL 60.90 11/13/2016   LDLCALC 123 (H) 11/13/2016  LDLDIRECT 152.2 09/27/2014   TRIG 198.0 (H) 11/13/2016   CHOLHDL 4 11/13/2016    Lab Results  Component Value Date   TSH 1.61 11/13/2016    Lumbar disc disease with radiculopathy  Currently following with ortho. We discussed a few treatment options: Amitriptyline,Cymbalta, and Gabapentin. Because Hx of palpitations, Amitriptyline not recommended. She agrees with trying Gabapentin, start 100 mg and titrate up to 300 mg adding 100 mg weekly as tolerated. Some side effects discussed. Cymbalta may also be consider next OV. F/U in 6 weeks.  -     gabapentin (NEURONTIN) 100 MG capsule; Take 1 capsule (100 mg total) by mouth 3 (three) times daily.  Insomnia, unspecified type  Gabapentin may help. Other options: Doxepin and trazodone. Good sleep hygiene. F/U in 6 weeks.  -     gabapentin (NEURONTIN) 100 MG capsule; Take 1 capsule (100 mg total) by mouth 3 (three) times daily. -     TSH  Hyperglycemia  IFG vs DM II. Further recommendations will be given according to lab results. Healthy life style for prevention to continue.  -      Basic metabolic panel -     Hemoglobin A1c  Mixed hyperlipidemia  No changes in current management, non pharmacologic. I will follow labs done today and will give further recommendations accordingly.  -     Lipid panel -     Basic metabolic panel     Return in about 6 weeks (around 12/25/2016) for Back pain and insomnia.     Betty G. Martinique, MD  [facility]. [place] office.

## 2016-11-15 ENCOUNTER — Encounter: Payer: Self-pay | Admitting: Family Medicine

## 2016-12-08 ENCOUNTER — Other Ambulatory Visit: Payer: Self-pay

## 2016-12-08 DIAGNOSIS — M5116 Intervertebral disc disorders with radiculopathy, lumbar region: Secondary | ICD-10-CM

## 2016-12-08 DIAGNOSIS — G4700 Insomnia, unspecified: Secondary | ICD-10-CM

## 2016-12-08 MED ORDER — GABAPENTIN 100 MG PO CAPS: 100.0000 mg | ORAL_CAPSULE | Freq: Three times a day (TID) | ORAL | 0 refills | Status: DC

## 2016-12-16 ENCOUNTER — Encounter: Payer: Self-pay | Admitting: Internal Medicine

## 2016-12-21 ENCOUNTER — Encounter: Payer: Self-pay | Admitting: Internal Medicine

## 2016-12-25 ENCOUNTER — Ambulatory Visit: Payer: BLUE CROSS/BLUE SHIELD | Admitting: Family Medicine

## 2017-01-08 ENCOUNTER — Other Ambulatory Visit: Payer: Self-pay | Admitting: Neurological Surgery

## 2017-01-08 DIAGNOSIS — M4316 Spondylolisthesis, lumbar region: Secondary | ICD-10-CM

## 2017-01-19 ENCOUNTER — Ambulatory Visit
Admission: RE | Admit: 2017-01-19 | Discharge: 2017-01-19 | Disposition: A | Discharge status: Home / Self Care | Payer: BLUE CROSS/BLUE SHIELD | Source: Ambulatory Visit | Attending: Neurological Surgery | Admitting: Neurological Surgery

## 2017-01-19 DIAGNOSIS — M4316 Spondylolisthesis, lumbar region: Secondary | ICD-10-CM

## 2017-01-24 NOTE — Progress Notes (Signed)
HPI:   Ellen Collins is a 66 y.o. female, who is here today to follow on some chronic medical problems addressed last OV.  Last seen on 11/13/16.  Last OV Gabapentin was recommended, Hx of chronic back pain with radicular pain and insomnia.  She is currently following with ortho, spinal stenosis, pain is worse when she moves after prolonged rest,mainly first thing in the morning. Improves with movement.Radiated to LE's,posterior aspect of thighs.  She had another lumbar MRI and has an appt with neurosurgeon this coming Friday, surgery recommended but she is planing on asking about another epidural injection,which helped greatly in the past.   Last OV Gabapentin 100 mg stated and instructed to titrate up to 300 mg.She did not take it as instructed. She started Gabapentin 100 mg at bedtime and increased it to bid 3 days ago. She feels like Gabapentin has helped pain. Denies side effects.  She is also taking Ibuprofen 400 mg, decreased from tid to 1-2 times per day. "A lot of pain today" She was picking sticks a couple days ago.Denies any trauma.  Pain fluctuates,most of the time it is incapacitating.    -Sleeping better sine she started Gabapentin 100 mg at bedtime, "deep" "good' sleep for about 6-8 hours, wakes up once and goes back to sleep with no problem. She has not noted side effects.   Concerns today: Tinnitus.  -"Always" has had left side tinnitus,denies hearing loss or earache. She wonders if she needs  A ear lavage,which she has had before and helped some with problem.  -Trying to lose wt, has gained about 12 pounds in the past 3 years. Started Weight Watchers recently, she has not been able to exercise due to pain.   -No Hx of HTN. BP elevated today.  States that BP's at home are elevated sometimes,which she attributed to pain.Reports Hx of "low BP. Denies severe/frequent headache, visual changes, chest pain, dyspnea, palpitation, claudication, focal  weakness, or edema.    Review of Systems  Constitutional: Negative for appetite change, fatigue and fever.  HENT: Positive for tinnitus. Negative for ear discharge, ear pain, hearing loss, mouth sores, nosebleeds and sore throat.   Eyes: Negative for pain and visual disturbance.  Respiratory: Negative for cough, shortness of breath and wheezing.   Cardiovascular: Negative for chest pain, palpitations and leg swelling.  Gastrointestinal: Negative for abdominal pain, nausea and vomiting.       No changes in bowel habits.  Genitourinary: Negative for decreased urine volume and hematuria.  Musculoskeletal: Positive for back pain. Negative for gait problem and myalgias.  Skin: Negative for rash.  Allergic/Immunologic: Positive for environmental allergies.  Neurological: Negative for syncope, weakness and headaches.  Hematological: Does not bruise/bleed easily.  Psychiatric/Behavioral: Negative for confusion and sleep disturbance. The patient is nervous/anxious.       Current Outpatient Prescriptions on File Prior to Visit  Medication Sig Dispense Refill  . cholecalciferol (VITAMIN D) 1000 UNITS tablet Take 1,000 Units by mouth daily.    Marland Kitchen estradiol (ESTRACE) 1 MG tablet Take 1 mg by mouth daily.    Marland Kitchen loratadine (CLARITIN) 10 MG tablet Take 10 mg by mouth as needed. allergies    . Multiple Vitamins-Minerals (MULTIVITAMIN WITH MINERALS) tablet Take 1 tablet by mouth daily.    . Omega-3 Fatty Acids (FISH OIL) 1000 MG CAPS Take 1 capsule by mouth daily.     No current facility-administered medications on file prior to visit.  Past Medical History:  Diagnosis Date  . Allergy   . Anxiety   . Paroxysmal SVT (supraventricular tachycardia) ([place])    she reports rare problems over years   Allergies  Allergen Reactions  . Latex     Social History   Social History  . Marital status: Married    Spouse name: N/A  . Number of children: N/A  . Years of education: N/A   Social  History Main Topics  . Smoking status: Never Smoker  . Smokeless tobacco: Never Used  . Alcohol use 0.6 oz/week    1 Glasses of wine per week  . Drug use: No  . Sexual activity: Not Asked   Other Topics Concern  . None   Social History Narrative  . None    Vitals:   01/25/17 1151  BP: (!) 160/80  Pulse: 90  Resp: 12  O2 sat 96% at RA. Body mass index is 28.25 kg/m.   Physical Exam  Nursing note and vitals reviewed. Constitutional: She is oriented to person, place, and time. She appears well-developed. No distress.  HENT:  Head: Atraumatic.  Right Ear: Hearing, tympanic membrane, external ear and ear canal normal.  Left Ear: Hearing, tympanic membrane, external ear and ear canal normal.  Mouth/Throat: Oropharynx is clear and moist and mucous membranes are normal.  Eyes: Conjunctivae and EOM are normal. Pupils are equal, round, and reactive to light.  Cardiovascular: Normal rate and regular rhythm.   Murmur (SEM I/VI  LUSB) heard. Pulses:      Dorsalis pedis pulses are 2+ on the right side, and 2+ on the left side.  Respiratory: Effort normal and breath sounds normal. No respiratory distress.  GI: Soft. She exhibits no mass. There is no hepatomegaly. There is no tenderness.  Musculoskeletal: She exhibits edema (Trace pitting edema LE bilateral.). She exhibits no tenderness.  No tenderness of paraspinal muscles with palpation.  Lymphadenopathy:    She has no cervical adenopathy.  Neurological: She is alert and oriented to person, place, and time. She has normal strength. Coordination and gait normal.  Reflex Scores:      Patellar reflexes are 2+ on the right side and 2+ on the left side. SLR negative bilateral.  Skin: Skin is warm. No erythema.  Psychiatric: Her mood appears anxious.  Well groomed, good eye contact.      ASSESSMENT AND PLAN:   Ifeoluwa was seen today for follow-up.  Diagnoses and all orders for this visit:  Lumbar disc disease with  radiculopathy  Improved some but still having pain that limit her daily activities. Continue Gabapentin ,increase dose from 100 mg to 200 mg at bedtime.Some side effects discussed. Continue Gabapentin 100 mg in the morning. Cymbalta to be considered,some side effects discussed,she will let me know if she is willing to try. Keep appt with neurosurgeon.  -     gabapentin (NEURONTIN) 100 MG capsule; 1 cap in e morning and 2 caps at bedtime.  Insomnia, unspecified type  Improved. Good sleep hygiene. Continue gabapentin at bedtime.  -     gabapentin (NEURONTIN) 100 MG capsule; 1 cap in e morning and 2 caps at bedtime.  Elevated blood pressure reading  Re-checked 160/80. Monitor BP at home. Low salt diet. Caution with NSAID's. Instructed about warning signs. F/U in 3-4 months.  Tinnitus, left ear  Chronic. NSAID's could cause/exacerbate problem. Instructed about warning signs.     -Ms. NESSA RAMAKER was advised to return sooner than planned today if new  concerns arise.       Miaya Lafontant G. Martinique, MD  [facility]. [place] office.

## 2017-01-25 ENCOUNTER — Encounter: Payer: Self-pay | Admitting: Family Medicine

## 2017-01-25 ENCOUNTER — Ambulatory Visit (INDEPENDENT_AMBULATORY_CARE_PROVIDER_SITE_OTHER): Payer: BLUE CROSS/BLUE SHIELD | Admitting: Family Medicine

## 2017-01-25 VITALS — BP 160/80 | HR 90 | Resp 12 | Ht 66.0 in | Wt 175.0 lb

## 2017-01-25 DIAGNOSIS — M5116 Intervertebral disc disorders with radiculopathy, lumbar region: Secondary | ICD-10-CM

## 2017-01-25 DIAGNOSIS — R030 Elevated blood-pressure reading, without diagnosis of hypertension: Secondary | ICD-10-CM

## 2017-01-25 DIAGNOSIS — G4700 Insomnia, unspecified: Secondary | ICD-10-CM | POA: Diagnosis not present

## 2017-01-25 DIAGNOSIS — H9312 Tinnitus, left ear: Secondary | ICD-10-CM

## 2017-01-25 MED ORDER — GABAPENTIN 100 MG PO CAPS: ORAL_CAPSULE | ORAL | 0 refills | Status: DC

## 2017-01-25 NOTE — Patient Instructions (Addendum)
A few things to remember from today's visit:   Lumbar disc disease with radiculopathy - Plan: gabapentin (NEURONTIN) 100 MG capsule  Insomnia, unspecified type - Plan: gabapentin (NEURONTIN) 100 MG capsule  Elevated blood pressure reading  Tinnitus aurium, left Ibuprofen can aggravate ringing in ear.  Monitor blood pressure at home,goal < 140/90.   Cymbalta to consider.  Gabapentin 100 mg in the morning and 200 mg at bedtime.   Please be sure medication list is accurate. If a new problem present, please set up appointment sooner than planned today.

## 2017-01-25 NOTE — Progress Notes (Signed)
Pre visit review using our clinic review tool, if applicable. No additional management support is needed unless otherwise documented below in the visit note. 

## 2017-01-26 ENCOUNTER — Ambulatory Visit (AMBULATORY_SURGERY_CENTER): Payer: Self-pay

## 2017-01-26 VITALS — Ht 66.0 in | Wt 175.4 lb

## 2017-01-26 DIAGNOSIS — Z800 Family history of malignant neoplasm of digestive organs: Secondary | ICD-10-CM

## 2017-01-26 NOTE — Progress Notes (Signed)
Per pt, no allergies to soy or egg products.Pt not taking any weight loss meds or using  O2 at home. 

## 2017-01-27 ENCOUNTER — Encounter: Payer: Self-pay | Admitting: Family Medicine

## 2017-01-28 ENCOUNTER — Encounter: Payer: Self-pay | Admitting: Family Medicine

## 2017-02-02 ENCOUNTER — Encounter: Payer: Self-pay | Admitting: Internal Medicine

## 2017-02-09 ENCOUNTER — Ambulatory Visit (AMBULATORY_SURGERY_CENTER): Payer: BLUE CROSS/BLUE SHIELD | Admitting: Internal Medicine

## 2017-02-09 ENCOUNTER — Encounter: Payer: Self-pay | Admitting: Internal Medicine

## 2017-02-09 VITALS — BP 132/75 | HR 71 | Temp 98.9°F | Resp 21 | Ht 66.0 in | Wt 175.0 lb

## 2017-02-09 DIAGNOSIS — Z1211 Encounter for screening for malignant neoplasm of colon: Secondary | ICD-10-CM

## 2017-02-09 DIAGNOSIS — Z1212 Encounter for screening for malignant neoplasm of rectum: Secondary | ICD-10-CM

## 2017-02-09 DIAGNOSIS — Z800 Family history of malignant neoplasm of digestive organs: Secondary | ICD-10-CM | POA: Diagnosis not present

## 2017-02-09 MED ORDER — SODIUM CHLORIDE 0.9 % IV SOLN: 500.0000 mL | INTRAVENOUS | Status: DC

## 2017-02-09 NOTE — Progress Notes (Signed)
Spontaneous respirations throughout. VSS. Resting comfortably. To PACU on room air. Report to  [facility].

## 2017-02-09 NOTE — Progress Notes (Signed)
Pt's states no medical or surgical changes since previsit or office visit. 

## 2017-02-09 NOTE — Op Note (Signed)
[facility] Patient Name: Ellen Collins Procedure Date: 02/09/2017 11:16 AM MRN: [id number] Endoscopist: Gatha Mayer , MD Age: 66 Referring MD:  Date of Birth: 20-Apr-1951 Gender: Female Account #: [id number] Procedure:                Colonoscopy Indications:              Colon cancer screening in patient at increased                            risk: Colorectal cancer in father in 37's Medicines:                Propofol per Anesthesia, Monitored Anesthesia Care Procedure:                Pre-Anesthesia Assessment:                           - Prior to the procedure, a History and Physical                            was performed, and patient medications and                            allergies were reviewed. The patient's tolerance of                            previous anesthesia was also reviewed. The risks                            and benefits of the procedure and the sedation                            options and risks were discussed with the patient.                            All questions were answered, and informed consent                            was obtained. Prior Anticoagulants: The patient has                            taken no previous anticoagulant or antiplatelet                            agents. ASA Grade Assessment: II - A patient with                            mild systemic disease. After reviewing the risks                            and benefits, the patient was deemed in                            satisfactory condition to undergo the procedure.  After obtaining informed consent, the colonoscope                            was passed under direct vision. Throughout the                            procedure, the patient's blood pressure, pulse, and                            oxygen saturations were monitored continuously. The                            Colonoscope was introduced through the anus and   advanced to the the cecum, identified by                            appendiceal orifice and ileocecal valve. The                            colonoscopy was performed without difficulty. The                            patient tolerated the procedure well. The quality                            of the bowel preparation was excellent. The bowel                            preparation used was Miralax. The ileocecal valve,                            appendiceal orifice, and rectum were photographed. Scope In: 11:21:10 AM Scope Out: 11:31:40 AM Scope Withdrawal Time: 0 hours 6 minutes 44 seconds  Total Procedure Duration: 0 hours 10 minutes 30 seconds  Findings:                 The perianal and digital rectal examinations were                            normal.                           The entire examined colon appeared normal on direct                            and retroflexion views. Complications:            No immediate complications. Estimated Blood Loss:     Estimated blood loss: none. Impression:               - The entire examined colon is normal on direct and                            retroflexion views.                           -  No specimens collected. Recommendation:           - Patient has a contact number available for                            emergencies. The signs and symptoms of potential                            delayed complications were discussed with the                            patient. Return to normal activities tomorrow.                            Written discharge instructions were provided to the                            patient.                           - Resume previous diet.                           - Continue present medications.                           - Repeat colonoscopy in 5 years for screening                            purposes. Gatha Mayer, MD 02/09/2017 11:36:19 AM This report has been signed electronically.

## 2017-02-09 NOTE — Patient Instructions (Addendum)
   Everything remains normal - no polyps.  Your next routine colonoscopy should be in 5 years - 2023.   I appreciate the opportunity to care for you.  I appreciate the opportunity to care for you. Gatha Mayer, MD, FACG   YOU HAD AN ENDOSCOPIC PROCEDURE TODAY: Refer to the procedure report and other information in the discharge instructions given to you for any specific questions about what was found during the examination. If this information does not answer your questions, please call [place] office at [phone] to clarify.   YOU SHOULD EXPECT: Some feelings of bloating in the abdomen. Passage of more gas than usual. Walking can help get rid of the air that was put into your GI tract during the procedure and reduce the bloating. If you had a lower endoscopy (such as a colonoscopy or flexible sigmoidoscopy) you may notice spotting of blood in your stool or on the toilet paper. Some abdominal soreness may be present for a day or two, also.  DIET: Your first meal following the procedure should be a light meal and then it is ok to progress to your normal diet. A half-sandwich or bowl of soup is an example of a good first meal. Heavy or fried foods are harder to digest and may make you feel nauseous or bloated. Drink plenty of fluids but you should avoid alcoholic beverages for 24 hours. If you had a esophageal dilation, please see attached instructions for diet.    ACTIVITY: Your care partner should take you home directly after the procedure. You should plan to take it easy, moving slowly for the rest of the day. You can resume normal activity the day after the procedure however YOU SHOULD NOT DRIVE, use power tools, machinery or perform tasks that involve climbing or major physical exertion for 24 hours (because of the sedation medicines used during the test).   SYMPTOMS TO REPORT IMMEDIATELY: A gastroenterologist can be reached at any hour. Please call [phone]  for any of the  following symptoms:  Following lower endoscopy (colonoscopy, flexible sigmoidoscopy) Excessive amounts of blood in the stool  Significant tenderness, worsening of abdominal pains  Swelling of the abdomen that is new, acute  Fever of 100 or higher    FOLLOW UP:  If any biopsies were taken you will be contacted by phone or by letter within the next 1-3 weeks. Call [phone]  if you have not heard about the biopsies in 3 weeks.  Please also call with any specific questions about appointments or follow up tests.

## 2017-02-10 ENCOUNTER — Telehealth: Payer: Self-pay

## 2017-02-10 NOTE — Telephone Encounter (Signed)
  Follow up Call-  Call back number 02/09/2017  Post procedure Call Back phone  # [phone]  Permission to leave phone message Yes  Some recent data might be hidden     Patient questions:  Do you have a fever, pain , or abdominal swelling? No. Pain Score  0 *  Have you tolerated food without any problems? Yes.    Have you been able to return to your normal activities? Yes.    Do you have any questions about your discharge instructions: Diet   No. Medications  No. Follow up visit  No.  Do you have questions or concerns about your Care? No.  Actions: * If pain score is 4 or above: No action needed, pain <4.

## 2017-05-17 ENCOUNTER — Encounter: Payer: Self-pay | Admitting: Internal Medicine

## 2017-05-17 ENCOUNTER — Ambulatory Visit (INDEPENDENT_AMBULATORY_CARE_PROVIDER_SITE_OTHER): Payer: BLUE CROSS/BLUE SHIELD | Admitting: Internal Medicine

## 2017-05-17 ENCOUNTER — Encounter: Payer: Self-pay | Admitting: Emergency Medicine

## 2017-05-17 VITALS — BP 142/80 | HR 111 | Temp 98.4°F | Wt 170.5 lb

## 2017-05-17 DIAGNOSIS — J329 Chronic sinusitis, unspecified: Secondary | ICD-10-CM | POA: Diagnosis not present

## 2017-05-17 DIAGNOSIS — Z889 Allergy status to unspecified drugs, medicaments and biological substances status: Secondary | ICD-10-CM

## 2017-05-17 DIAGNOSIS — R519 Headache, unspecified: Secondary | ICD-10-CM

## 2017-05-17 DIAGNOSIS — R51 Headache: Secondary | ICD-10-CM | POA: Diagnosis not present

## 2017-05-17 MED ORDER — AMOXICILLIN-POT CLAVULANATE 875-125 MG PO TABS: 1.0000 | ORAL_TABLET | Freq: Two times a day (BID) | ORAL | 0 refills | Status: DC

## 2017-05-17 MED ORDER — PREDNISONE 20 MG PO TABS: ORAL_TABLET | ORAL | 0 refills | Status: DC

## 2017-05-17 NOTE — Patient Instructions (Addendum)
We can use antibiotic and short course steroid for  The face pain presumed sinus originating noted on your history  Saline nose sl;rpay   Look into flonase mist  Instead of  Spray for contole of sinus disease.  If    persistent or progressive see Dr Martinique consider getting another ent exam .  Warm compresses  Also

## 2017-05-17 NOTE — Progress Notes (Signed)
Chief Complaint  Patient presents with  . Headache    HPI: Ellen Collins 66 y.o.  SDA  PCP no appt today  States that she gets recurrent sinus problems but hasn't had one in a long time. She used to see Dr. Myles Lipps had allergy shots would get a shot of cortisone in an antibiotic however she's been doing much better recently and hasn't had one in a long time over a year. She gets pain on the left side of her face and sinus pressure at this point no drainage but feels that things are stuck in her sinuses. She has used  Flonase as needed. She states that she doesn't have migraines or 1 migraine medicine was tried it didn't help her headaches. She will take Claritin-D with some help. There is no fever vomiting neurologic symptoms but she is supposed to go on vacation to the beach next week.   ROS: See pertinent positives and negatives per HPI. No feer chills vomiting or photophobia   Past Medical History:  Diagnosis Date  . Allergy   . Anxiety   . GERD (gastroesophageal reflux disease)   . Low back pain radiating to both legs    worse of left side  . Paroxysmal SVT (supraventricular tachycardia) ([place])    she reports rare problems over years    Family History  Problem Relation Age of Onset  . Colon cancer Father   . Ovarian cancer Mother     Social History   Social History  . Marital status: Married    Spouse name: N/A  . Number of children: N/A  . Years of education: N/A   Social History Main Topics  . Smoking status: Never Smoker  . Smokeless tobacco: Never Used  . Alcohol use 0.6 oz/week    1 Glasses of wine per week  . Drug use: No  . Sexual activity: Not Asked   Other Topics Concern  . None   Social History Narrative  . None    Outpatient Medications Prior to Visit  Medication Sig Dispense Refill  . ALPRAZolam (XANAX) 0.5 MG tablet Take 0.5 mg by mouth as needed for anxiety.    . cholecalciferol (VITAMIN D) 1000 UNITS tablet Take 2,000 Units by mouth  daily.     Marland Kitchen estradiol (ESTRACE) 1 MG tablet Take 1 mg by mouth daily.    Marland Kitchen ibuprofen (ADVIL,MOTRIN) 200 MG tablet Take 200 mg by mouth every 8 (eight) hours as needed.    . loratadine (CLARITIN) 10 MG tablet Take 10 mg by mouth as needed. allergies    . Multiple Vitamins-Minerals (MULTIVITAMIN WITH MINERALS) tablet Take 1 tablet by mouth daily.    . NON FORMULARY Viviscal hair vitamin-Take one daily    . NON FORMULARY Magnilife Leg and back pain- Take one pill up to 3 times daily    . Omega-3 Fatty Acids (FISH OIL) 1000 MG CAPS Take 1 capsule by mouth daily. Take 350 mg daily    . ranitidine (ZANTAC) 150 MG capsule Take 150 mg by mouth daily.    Marland Kitchen 5-Hydroxytryptophan (5-HTP PO) Take by mouth daily.    Marland Kitchen gabapentin (NEURONTIN) 100 MG capsule 1 cap in e morning and 2 caps at bedtime. (Patient not taking: Reported on 05/17/2017) 180 capsule 0  . meloxicam (MOBIC) 7.5 MG tablet   1   Facility-Administered Medications Prior to Visit  Medication Dose Route Frequency Provider Last Rate Last Dose  . 0.9 %  sodium chloride infusion  500 mL Intravenous Continuous Gatha Mayer, MD         EXAM:  BP (!) 142/80 (BP Location: Right Arm, Patient Position: Sitting, Cuff Size: Normal)   Pulse (!) 111   Temp 98.4 F (36.9 C) (Oral)   Wt 170 lb 8 oz (77.3 kg)   BMI 27.52 kg/m   Body mass index is 27.52 kg/m.  GENERAL: vitals reviewed and listed above, alert, oriented, appears well hydrated and in no acute distressShe looks mildly uncomfortable but not toxic. HEENT: atraumatic, conjunctiva  clear, no obvious abnormalities on inspection of external nose and earstmx clear   Mild left tenderness  Maxilla   tms clear   OP : no lesion edema or exudate cobblestoning  NECK: no obvious masses on inspection palpation  LUNGS: clear to auscultation bilaterally, no wheezes, rales or rhonchi,  CV: HRRR, no clubbing cyanosis or  peripheral edema nl cap refill  MS: moves all extremities without noticeable focal   Abnormality Neuro non focal  Skin no acute  rashes  PSYCH: pleasant and cooperative, no obvious depression or anxiety  ASSESSMENT AND PLAN:  Discussed the following assessment and plan:  Head and face pain  Recurrent sinus infections  History of allergy Patient feels like this is a sinus infection or certainly inflammation although she admits that she hasn't had this in a while. In the past was given steroids and antibiotic concern she will get better if not. Discussed risk-benefit of medications I'm not 100% sure this is infection versus inflammation. 5 days or less of prednisone 7 days of antibiotic if needed saline to continue Flonase best used used twice a day or once a day during seasonal times. There may be other spray versions that are helpful and not problematic for her nose. -Patient advised to return or notify health care team  if symptoms worsen ,persist or new concerns arise.  Patient Instructions  We can use antibiotic and short course steroid for  The face pain presumed sinus originating noted on your history  Saline nose sl;rpay   Look into flonase mist  Instead of  Spray for contole of sinus disease.  If    persistent or progressive see Dr Martinique consider getting another ent exam .  Warm compresses  Also        Mariann Laster K. Panosh M.D.

## 2017-05-25 ENCOUNTER — Encounter: Payer: Self-pay | Admitting: Family Medicine

## 2017-05-26 ENCOUNTER — Emergency Department (HOSPITAL_COMMUNITY): Payer: BLUE CROSS/BLUE SHIELD

## 2017-05-26 ENCOUNTER — Encounter (HOSPITAL_COMMUNITY): Payer: Self-pay | Admitting: Emergency Medicine

## 2017-05-26 ENCOUNTER — Telehealth: Payer: Self-pay | Admitting: Family Medicine

## 2017-05-26 ENCOUNTER — Encounter: Payer: Self-pay | Admitting: Family Medicine

## 2017-05-26 ENCOUNTER — Emergency Department (HOSPITAL_COMMUNITY)
Admission: EM | Admit: 2017-05-26 | Discharge: 2017-05-27 | Disposition: A | Discharge status: Home / Self Care | Payer: BLUE CROSS/BLUE SHIELD | Attending: Emergency Medicine | Admitting: Emergency Medicine

## 2017-05-26 DIAGNOSIS — Z79899 Other long term (current) drug therapy: Secondary | ICD-10-CM | POA: Insufficient documentation

## 2017-05-26 DIAGNOSIS — R51 Headache: Secondary | ICD-10-CM | POA: Insufficient documentation

## 2017-05-26 DIAGNOSIS — M79604 Pain in right leg: Secondary | ICD-10-CM

## 2017-05-26 DIAGNOSIS — R079 Chest pain, unspecified: Secondary | ICD-10-CM

## 2017-05-26 DIAGNOSIS — Z91040 Latex allergy status: Secondary | ICD-10-CM | POA: Diagnosis not present

## 2017-05-26 DIAGNOSIS — M79605 Pain in left leg: Secondary | ICD-10-CM | POA: Insufficient documentation

## 2017-05-26 DIAGNOSIS — R112 Nausea with vomiting, unspecified: Secondary | ICD-10-CM

## 2017-05-26 DIAGNOSIS — R519 Headache, unspecified: Secondary | ICD-10-CM

## 2017-05-26 LAB — CBC
HCT: 37.9 % (ref 36.0–46.0)
Hemoglobin: 13 g/dL (ref 12.0–15.0)
MCH: 31.8 pg (ref 26.0–34.0)
MCHC: 34.3 g/dL (ref 30.0–36.0)
MCV: 92.7 fL (ref 78.0–100.0)
Platelets: 309 10*3/uL (ref 150–400)
RBC: 4.09 MIL/uL (ref 3.87–5.11)
RDW: 12.5 % (ref 11.5–15.5)
WBC: 9.9 10*3/uL (ref 4.0–10.5)

## 2017-05-26 LAB — URINALYSIS, ROUTINE W REFLEX MICROSCOPIC
Bilirubin Urine: NEGATIVE
Glucose, UA: NEGATIVE mg/dL
Hgb urine dipstick: NEGATIVE
Ketones, ur: 20 mg/dL — AB
Leukocytes, UA: NEGATIVE
Nitrite: NEGATIVE
Protein, ur: NEGATIVE mg/dL
Specific Gravity, Urine: 1.02 (ref 1.005–1.030)
pH: 6 (ref 5.0–8.0)

## 2017-05-26 LAB — BASIC METABOLIC PANEL
Anion gap: 12 (ref 5–15)
BUN: 10 mg/dL (ref 6–20)
CO2: 27 mmol/L (ref 22–32)
Calcium: 9 mg/dL (ref 8.9–10.3)
Chloride: 95 mmol/L — ABNORMAL LOW (ref 101–111)
Creatinine, Ser: 0.75 mg/dL (ref 0.44–1.00)
GFR calc Af Amer: >60 mL/min (ref >60)
GFR calc non Af Amer: >60 mL/min (ref >60)
Glucose, Bld: 149 mg/dL — ABNORMAL HIGH (ref 65–99)
Potassium: 3.6 mmol/L (ref 3.5–5.1)
Sodium: 134 mmol/L — ABNORMAL LOW (ref 135–145)

## 2017-05-26 LAB — HEPATIC FUNCTION PANEL
ALT: 21 U/L (ref 14–54)
AST: 21 U/L (ref 15–41)
Albumin: 3.9 g/dL (ref 3.5–5.0)
Alkaline Phosphatase: 44 U/L (ref 38–126)
Bilirubin, Direct: 0.1 mg/dL (ref 0.1–0.5)
Indirect Bilirubin: 0.5 mg/dL (ref 0.3–0.9)
Total Bilirubin: 0.6 mg/dL (ref 0.3–1.2)
Total Protein: 6.8 g/dL (ref 6.5–8.1)

## 2017-05-26 LAB — LIPASE, BLOOD: Lipase: 13 U/L (ref 11–51)

## 2017-05-26 LAB — POCT I-STAT TROPONIN I: Troponin i, poc: 0 ng/mL (ref 0.00–0.08)

## 2017-05-26 MED ORDER — CYCLOBENZAPRINE HCL 10 MG PO TABS: 10.0000 mg | ORAL_TABLET | Freq: Two times a day (BID) | ORAL | 0 refills | Status: DC | PRN

## 2017-05-26 MED ORDER — DIPHENHYDRAMINE HCL 50 MG/ML IJ SOLN
25.0000 mg | Freq: Once | INTRAMUSCULAR | Status: AC
  Administered 2017-05-26: 25 mg via INTRAVENOUS
  Filled 2017-05-26: qty 1

## 2017-05-26 MED ORDER — KETOROLAC TROMETHAMINE 30 MG/ML IJ SOLN
30.0000 mg | Freq: Once | INTRAMUSCULAR | Status: AC
  Administered 2017-05-26: 30 mg via INTRAVENOUS
  Filled 2017-05-26: qty 1

## 2017-05-26 MED ORDER — CYCLOBENZAPRINE HCL 10 MG PO TABS
10.0000 mg | ORAL_TABLET | Freq: Once | ORAL | Status: AC
  Administered 2017-05-26: 10 mg via ORAL
  Filled 2017-05-26: qty 1

## 2017-05-26 MED ORDER — SODIUM CHLORIDE 0.9 % IV BOLUS (SEPSIS)
1000.0000 mL | Freq: Once | INTRAVENOUS | Status: AC
  Administered 2017-05-26: 1000 mL via INTRAVENOUS

## 2017-05-26 MED ORDER — ONDANSETRON 4 MG PO TBDP: 4.0000 mg | ORAL_TABLET | Freq: Three times a day (TID) | ORAL | 0 refills | Status: DC | PRN

## 2017-05-26 MED ORDER — PROCHLORPERAZINE EDISYLATE 5 MG/ML IJ SOLN
10.0000 mg | Freq: Once | INTRAMUSCULAR | Status: AC
Start: 2017-05-26 — End: 2017-05-26
  Administered 2017-05-26: 10 mg via INTRAVENOUS
  Filled 2017-05-26: qty 2

## 2017-05-26 NOTE — Telephone Encounter (Signed)
Pt state that she is very nauseas and would like to have something for that and the headache until she is able to come in she feels so bad she is not able to sit up for long periods of time and able to eat anything.   Pharm:  [facility] and [employer]

## 2017-05-26 NOTE — Telephone Encounter (Signed)
Patient reporting mild headache x 2 weeks, worse since Sunday, left ear and left eye pressure as well; patient unable to keep any solids or liquids down x 1 day; patient hasn't voided since early this morning. Reports approx 1 tablespoon of urine since. Mild fever of 99.8; chills off and on; lower extremities are achy and painful; pain different from typical chronic pain issues. Notified Dr. Martinique; MD advises patient to go to ER for eval; notified patient who reports her husband will take her to the ER for eval.

## 2017-05-26 NOTE — ED Triage Notes (Signed)
Pt states she has had nausea and vomiting since yesterday and this morning she started having a dull aching pain in her left chest where her bra goes around  Pt states she has dizziness and is unable to hold anything down including water   Pt is c/o pain in the left side of her head that goes into her neck, face, and ear  Pt states she has had chills  Pt also has pain in her legs worse on the left side  Pt states she receives injections in her back for neurology  Pt states she has also had diarrhea for the past couple of weeks  Pt states she recently was on antibiotics and prednisone

## 2017-05-26 NOTE — ED Notes (Signed)
Pt is alert and oriented x 4 and is verbally responsive. Pt states that she has been vomiting x 2 days with associated left sided dull chest pain 5/10, and bilateral leg pain 10/10 sharp that worsens with ambulation. Pt does have h/o of spinal stenosis and gets spinal injections for treatment. Pt spouse is at bedside.

## 2017-05-26 NOTE — ED Provider Notes (Signed)
[facility] DEPT Provider Note   CSN: [id number] Arrival date & time: 05/26/17  1910     History   Chief Complaint Chief Complaint  Patient presents with  . Chest Pain  . Emesis    HPI Ellen Collins is a 66 y.o. female.  HPI  Presents with multiple concerns, including bilateral leg pain, headache and vomiting.  Began throwing up on Monday.  Everytime try to eat or drink throws up. Threw up pepto bismol, tylenol.  Greater than 20 episodes of emesis per day.  Movement makes the headache worse.  Has not had emesis like this before.  Has sinus infections in past but not as severe.  Headache has been mild for 3 weeks, went to doctor on 7/9, was given abx and prednisone, initially felt better but didn't go away, then returned Monday AM and was more severe.  Leg pain woke her from sleep then realized had headache as well.  10/10, throwing up and movement make headache worse.  Laying down makes it worse.  Leg pain also 10/10.  Started on Monday. Severe, has been diagnosed with spinal stenosis but this feels different, sharp all over the legs pain.  Worse in back of legs bilaterally. Hurts some in epigastrum, not significant.  Diarrhea last week 4 times per day but not since this weekend though.  Passing flatus but not having BM, but reports not taking anything in.  Feels chills. No fever.  No urinary symptoms.  Dull sick feeling chest pain, feeling in chest started today, assumed it was from emesis. Stopped taking vitamins a few days ago. Does not smoke marijuana. Has not stopped any other medications other than steroid/abx she received on 7/9.    Past Medical History:  Diagnosis Date  . Allergy   . Anxiety   . GERD (gastroesophageal reflux disease)   . Low back pain radiating to both legs    worse of left side  . Paroxysmal SVT (supraventricular tachycardia) ([place])    she reports rare problems over years    Patient Active Problem List   Diagnosis Date Noted  . Mixed hyperlipidemia  11/13/2016  . Insomnia 11/13/2016  . Lumbar disc disease with radiculopathy 08/14/2014  . Back pain 04/12/2013  . NECK PAIN 01/09/2010  . PANIC DISORDER 07/19/2009  . DYSPHAGIA 06/17/2009  . Anxiety state 11/16/2008  . Allergic rhinitis 11/16/2008    Past Surgical History:  Procedure Laterality Date  . broken foot     right foot/ has pins  . COLONOSCOPY    . UPPER GASTROINTESTINAL ENDOSCOPY    . VAGINAL HYSTERECTOMY  1998    OB History    No data available       Home Medications    Prior to Admission medications   Medication Sig Start Date End Date Taking? Authorizing Provider  acetaminophen (TYLENOL) 500 MG tablet Take 500 mg by mouth every 6 (six) hours as needed for moderate pain.   Yes [provider]  ALPRAZolam Duanne Moron) 0.5 MG tablet Take 0.5 mg by mouth as needed for anxiety.   Yes [provider]  bismuth subsalicylate (PEPTO BISMOL) 262 MG/15ML suspension Take 30 mLs by mouth every 6 (six) hours as needed for indigestion or diarrhea or loose stools.   Yes [provider]  cholecalciferol (VITAMIN D) 1000 UNITS tablet Take 2,000 Units by mouth daily.    Yes [provider]  estradiol (ESTRACE) 1 MG tablet Take 1 mg by mouth daily.   Yes [provider]  Fructose-Dextrose-Phosphor Acd (ANTI-NAUSEA PO) Take 1 tablet by mouth daily.   Yes [provider]  ibuprofen (ADVIL,MOTRIN) 200 MG tablet Take 200 mg by mouth every 8 (eight) hours as needed.   Yes [provider]  loratadine (CLARITIN) 10 MG tablet Take 10 mg by mouth as needed. allergies   Yes [provider]  Multiple Vitamins-Minerals (MULTIVITAMIN WITH MINERALS) tablet Take 1 tablet by mouth daily.   Yes [provider]  NON FORMULARY Viviscal hair vitamin-Take one daily   Yes [provider]  NON FORMULARY Magnilife Leg and back pain- Take one pill up to 3 times daily   Yes [provider]  Omega-3 Fatty Acids  (FISH OIL) 1000 MG CAPS Take 1 capsule by mouth daily. Take 350 mg daily   Yes [provider]  ranitidine (ZANTAC) 150 MG capsule Take 150 mg by mouth daily.   Yes [provider]  amoxicillin-clavulanate (AUGMENTIN) 875-125 MG tablet Take 1 tablet by mouth every 12 (twelve) hours. Patient not taking: Reported on 05/26/2017 05/17/17   Panosh, Standley Brooking, MD  cyclobenzaprine (FLEXERIL) 10 MG tablet Take 1 tablet (10 mg total) by mouth 2 (two) times daily as needed for muscle spasms. 05/26/17   Gareth Morgan, MD  gabapentin (NEURONTIN) 100 MG capsule 1 cap in e morning and 2 caps at bedtime. Patient not taking: Reported on 05/17/2017 01/25/17   Martinique, Betty G, MD  ondansetron (ZOFRAN ODT) 4 MG disintegrating tablet Take 1 tablet (4 mg total) by mouth every 8 (eight) hours as needed for nausea or vomiting. 05/26/17   Gareth Morgan, MD  predniSONE (DELTASONE) 20 MG tablet Take 3 po qd for 2 days then 2 po qd for 3 days,or as directed Patient not taking: Reported on 05/26/2017 05/17/17   Panosh, Standley Brooking, MD    Family History Family History  Problem Relation Age of Onset  . Colon cancer Father   . Ovarian cancer Mother     Social History Social History  Substance Use Topics  . Smoking status: Never Smoker  . Smokeless tobacco: Never Used  . Alcohol use 0.6 oz/week    1 Glasses of wine per week     Comment: social     Allergies   Latex   Review of Systems Review of Systems  Constitutional: Positive for chills and fatigue. Negative for fever.  HENT: Negative for congestion.   Eyes: Negative for visual disturbance.  Respiratory: Negative for cough and shortness of breath.   Cardiovascular: Positive for chest pain.  Gastrointestinal: Positive for abdominal pain, nausea and vomiting. Negative for constipation and diarrhea.  Genitourinary: Positive for decreased urine volume. Negative for dysuria.  Musculoskeletal: Positive for arthralgias, myalgias and neck pain (right  side of neck).  Neurological: Positive for headaches. Negative for facial asymmetry and weakness.     Physical Exam Updated Vital Signs BP 112/69   Pulse 87   Temp 98.8 F (37.1 C) (Oral)   Resp 18   Ht 5\' 7"  (1.702 m)   Wt 76.7 kg (169 lb)   SpO2 95%   BMI 26.47 kg/m   Physical Exam  Constitutional: She is oriented to person, place, and time. She appears well-developed and well-nourished. No distress.  HENT:  Head: Normocephalic and atraumatic.  Eyes: Pupils are equal, round, and reactive to light. Conjunctivae and EOM are normal.  Neck: Normal range of motion.  Cardiovascular: Normal rate, regular rhythm, normal heart sounds and intact distal pulses.  Exam reveals  no gallop and no friction rub.   No murmur heard. Pulmonary/Chest: Effort normal and breath sounds normal. No respiratory distress. She has no wheezes. She has no rales.  Abdominal: Soft. She exhibits no distension. There is no tenderness. There is no guarding.  Musculoskeletal: She exhibits no edema or tenderness.  Neurological: She is alert and oriented to person, place, and time. She has normal strength. No sensory deficit.  Skin: Skin is warm and dry. No rash noted. She is not diaphoretic. No erythema.  Nursing note and vitals reviewed.    ED Treatments / Results  Labs (all labs ordered are listed, but only abnormal results are displayed) Labs Reviewed  BASIC METABOLIC PANEL - Abnormal; Notable for the following:       Result Value   Sodium 134 (*)    Chloride 95 (*)    Glucose, Bld 149 (*)    All other components within normal limits  URINALYSIS, ROUTINE W REFLEX MICROSCOPIC - Abnormal; Notable for the following:    Ketones, ur 20 (*)    All other components within normal limits  CBC  HEPATIC FUNCTION PANEL  LIPASE, BLOOD  I-STAT TROPONIN, ED  POCT I-STAT TROPONIN I    EKG  EKG Interpretation  Date/Time:  Wednesday May 26 2017 19:18:08 EDT Ventricular Rate:  102 PR Interval:    QRS  Duration: 95 QT Interval:  345 QTC Calculation: 450 R Axis:   82 Text Interpretation:  Sinus tachycardia Borderline right axis deviation Minimal ST depression, lateral leads Baseline wander in lead(s) II aVF V4 No significant change since last tracing Confirmed by Gareth Morgan [phone]) on 05/26/2017 7:50:36 PM       Radiology Dg Chest 2 View  Result Date: 05/26/2017 CLINICAL DATA:  Dizziness and chest pain EXAM: CHEST  2 VIEW COMPARISON:  November 19, 2008 FINDINGS: Lungs are clear. Heart size and pulmonary vascularity are normal. No adenopathy. There is degenerative change in the thoracic spine. IMPRESSION: No edema or consolidation. Electronically Signed   By: Lowella Grip III M.D.   On: 05/26/2017 19:51   Ct Head Wo Contrast  Result Date: 05/26/2017 CLINICAL DATA:  66 year old female with headache and emesis. EXAM: CT HEAD WITHOUT CONTRAST TECHNIQUE: Contiguous axial images were obtained from the base of the skull through the vertex without intravenous contrast. COMPARISON:  None. FINDINGS: Brain: The ventricles and sulci appropriate size for patient's age. The gray-white matter discrimination is preserved. There is no acute intracranial hemorrhage. No mass effect or midline shift noted. No extra-axial fluid collection. Vascular: No hyperdense vessel or unexpected calcification. Skull: Normal. Negative for fracture or focal lesion. Sinuses/Orbits: No acute finding. Other: None IMPRESSION: Normal noncontrast CT of the brain. Electronically Signed   By: Anner Crete M.D.   On: 05/26/2017 21:27    Procedures Procedures (including critical care time)  Medications Ordered in ED Medications  sodium chloride 0.9 % bolus 1,000 mL (0 mLs Intravenous Stopped 05/26/17 2335)  prochlorperazine (COMPAZINE) injection 10 mg (10 mg Intravenous Given 05/26/17 2111)  diphenhydrAMINE (BENADRYL) injection 25 mg (25 mg Intravenous Given 05/26/17 2112)  ketorolac (TORADOL) 30 MG/ML injection 30 mg (30  mg Intravenous Given 05/26/17 2307)  cyclobenzaprine (FLEXERIL) tablet 10 mg (10 mg Oral Given 05/26/17 2306)     Initial Impression / Assessment and Plan / ED Course  I have reviewed the triage vital signs and the nursing notes.  Pertinent labs & imaging results that were available during my care of the patient were reviewed by  me and considered in my medical decision making (see chart for details).    66yo female with history of hyperlipidemia, spinal stenosis presents with multiple concerns including headache, nausea and vomiting, bilateral leg pain, chest pain.  Regarding headache: CT head negative.  Overall doubt SAH given slow onset of symptoms originally, report of pt noticing leg pain prior to headache when it worsened on Monday. No fever or signs of meningitis. No fever to suggest RMSF.  Pt on estradiol and discussed would consider dural venous thrombosis if symptoms continue, however given multiple other symptoms at this time favor viral etiology of headache and symptoms and have lower suspicion for dural venous thrombosis.  Headache improved with compazine/benadryl.  Recommend continued outpatient follow up .     Regarding leg pain, patient with history of spinal stenosis and suspect this as etiology of symptoms. Normal pulses bilaterally, doubt dissection/acute arterial occlusion, no swelling doubt DVT. No significant electrolyte abnormalities.  Regarding emesis: patient reports this is severe, greater than 20 times per day. No sign of intracranial etiology on head CT.  Urinalysis without signs of UTI.  Abdominal exam benign, passing flatus, doubt acute obstruction, appendicitis, cholecystitis. No sign of metabolic etiology. Denies withdrawal.  Suspect viral etiology given headache, myalgias. Patient able to tolerate po in ED, no additional emesis after compazine.   Regarding chest pain, patient lists this as lower concern. EKG without acute changes. No hypoxia, no tachypnea, have low  suspicion for PE. Pain developed after several episodes of emesis and suspect it is due to reflux and msk pain from vomiting. Troponin negative. Doubt ACS.  Normal bilateral pulses, normal blood pressures, normal CXR, doubt dissection. Pneumothorax, pneumonia.   Recommend close PCP follow up for symptoms, and consider MRI if continuing headache. Given rx for zofran and flexeril for suspect viral etiology of headache, emesis, myalgias.   Final Clinical Impressions(s) / ED Diagnoses   Final diagnoses:  Nausea and vomiting, intractability of vomiting not specified, unspecified vomiting type, suspect viral etiology  Acute nonintractable headache, unspecified headache type  Bilateral leg pain  Chest pain, unspecified type    New Prescriptions New Prescriptions   CYCLOBENZAPRINE (FLEXERIL) 10 MG TABLET    Take 1 tablet (10 mg total) by mouth 2 (two) times daily as needed for muscle spasms.   ONDANSETRON (ZOFRAN ODT) 4 MG DISINTEGRATING TABLET    Take 1 tablet (4 mg total) by mouth every 8 (eight) hours as needed for nausea or vomiting.     Gareth Morgan, MD 05/27/17 [id number]

## 2017-05-26 NOTE — Telephone Encounter (Signed)
If she feels like it is sinus related we could arrange for sinus CT since she did not respond to treatment. Allergies and viral illness can also cause sinus pain. If "bad" headache and nausea, she may need to be evaluated in acute care.  Thanks, BJ

## 2017-07-29 ENCOUNTER — Encounter: Payer: Self-pay | Admitting: Family Medicine

## 2017-08-10 ENCOUNTER — Ambulatory Visit (INDEPENDENT_AMBULATORY_CARE_PROVIDER_SITE_OTHER): Payer: BLUE CROSS/BLUE SHIELD

## 2017-08-10 DIAGNOSIS — Z23 Encounter for immunization: Secondary | ICD-10-CM

## 2017-11-08 LAB — HM MAMMOGRAPHY

## 2017-11-12 ENCOUNTER — Encounter: Payer: Self-pay | Admitting: Family Medicine

## 2017-11-15 DIAGNOSIS — R922 Inconclusive mammogram: Secondary | ICD-10-CM | POA: Diagnosis not present

## 2017-11-15 LAB — HM MAMMOGRAPHY

## 2017-11-19 ENCOUNTER — Encounter: Payer: Self-pay | Admitting: Family Medicine

## 2017-11-22 DIAGNOSIS — J209 Acute bronchitis, unspecified: Secondary | ICD-10-CM | POA: Diagnosis not present

## 2018-01-05 ENCOUNTER — Telehealth: Payer: Self-pay

## 2018-01-12 NOTE — Telephone Encounter (Signed)
Patient had her flu immunization and tolerated the injection well.

## 2018-02-09 DIAGNOSIS — Z79890 Hormone replacement therapy (postmenopausal): Secondary | ICD-10-CM | POA: Diagnosis not present

## 2018-02-09 DIAGNOSIS — Z124 Encounter for screening for malignant neoplasm of cervix: Secondary | ICD-10-CM | POA: Diagnosis not present

## 2018-02-09 DIAGNOSIS — L659 Nonscarring hair loss, unspecified: Secondary | ICD-10-CM | POA: Diagnosis not present

## 2018-03-10 DIAGNOSIS — H6122 Impacted cerumen, left ear: Secondary | ICD-10-CM | POA: Diagnosis not present

## 2018-03-10 DIAGNOSIS — J029 Acute pharyngitis, unspecified: Secondary | ICD-10-CM | POA: Diagnosis not present

## 2018-06-20 IMAGING — CR DG CHEST 2V
2 series · 2 of 2 positions shown · non-contrast
Comparison: November 19, 2008

CLINICAL DATA: Dizziness and chest pain

EXAM:
CHEST  2 VIEW

[w chest pa]
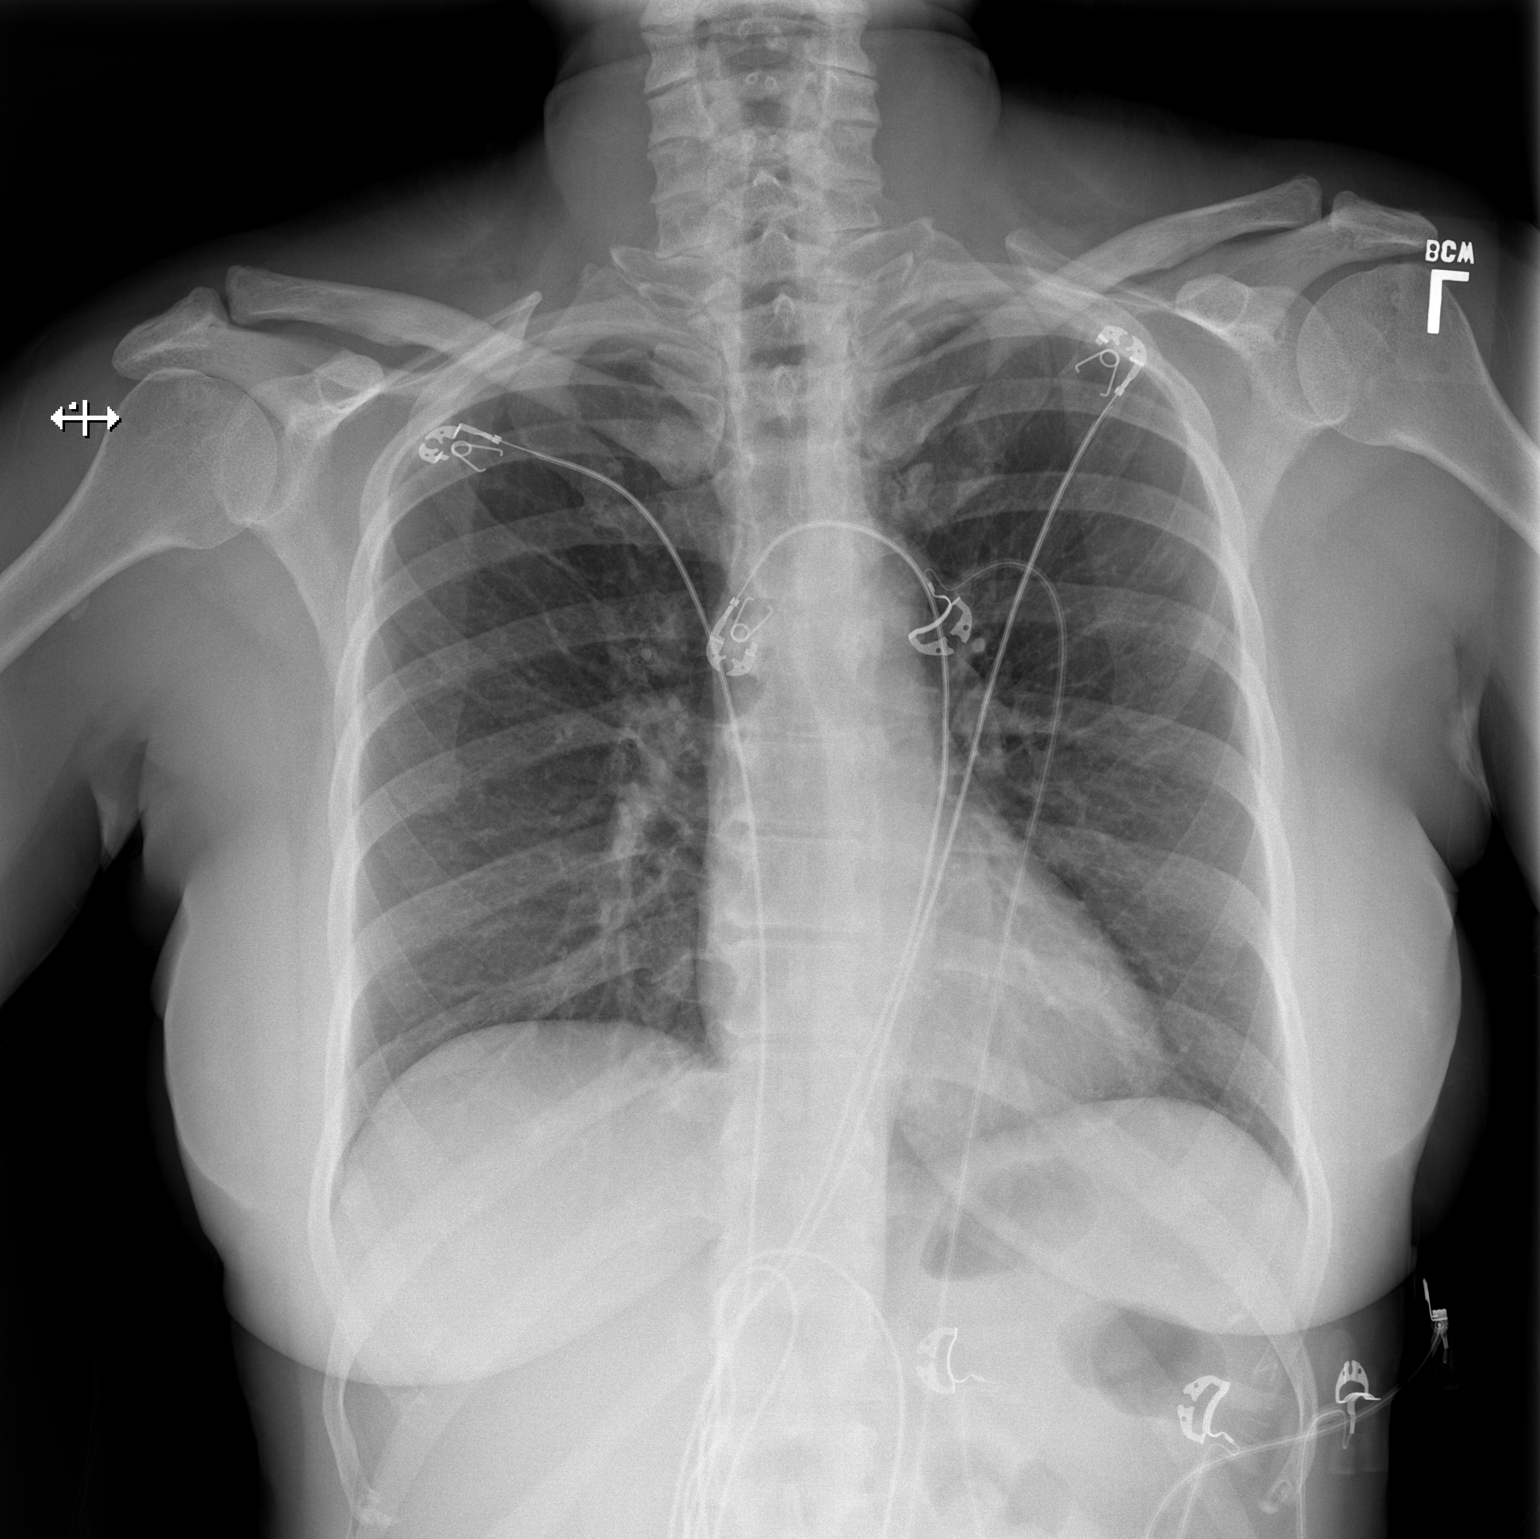

[w chest lat]
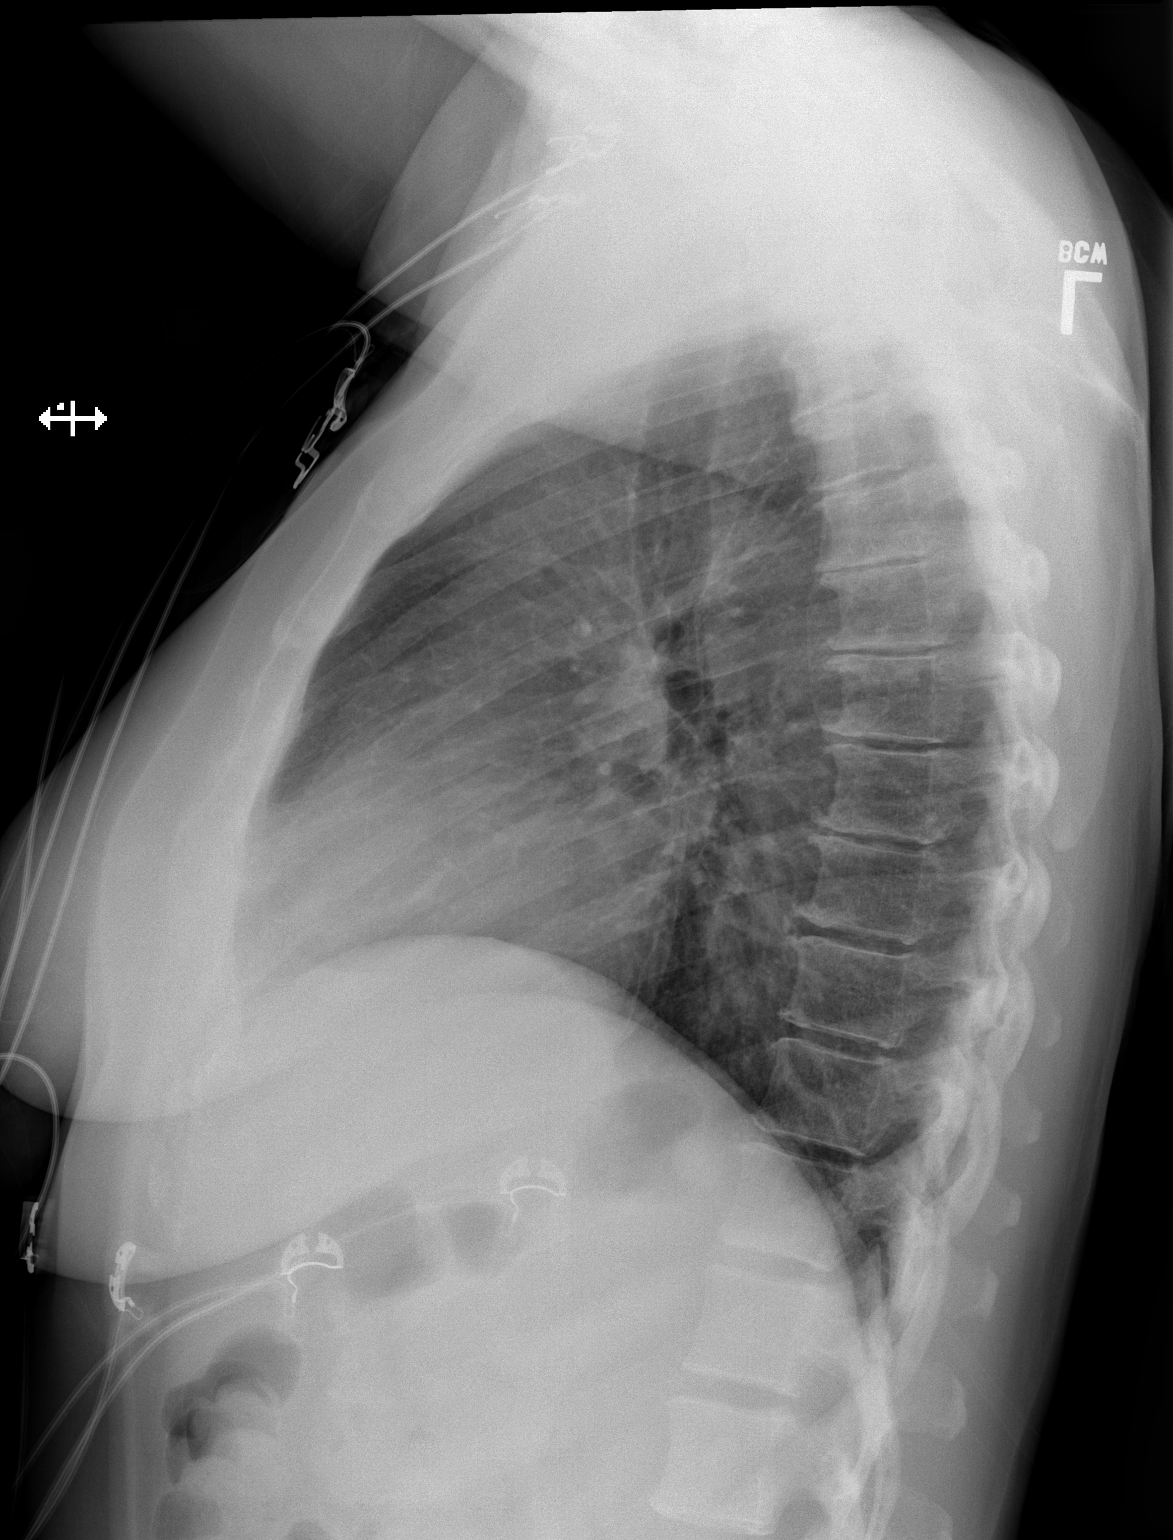

[2 of 2 positions shown; findings below may reference images not displayed]

FINDINGS: Lungs are clear. Heart size and pulmonary vascularity are normal. No
adenopathy. There is degenerative change in the thoracic spine.
IMPRESSION: No edema or consolidation.

## 2018-06-20 IMAGING — CT CT HEAD W/O CM
3 of 4 series · 15 of 47 positions shown, 18 images · non-contrast
Comparison: None.

CLINICAL DATA: 66-year-old female with headache and emesis.

EXAM:
CT HEAD WITHOUT CONTRAST
TECHNIQUE: Contiguous axial images were obtained from the base of the skull
through the vertex without intravenous contrast.

[Series 2: head w/o · axial · non-contrast · 0.45mm/px · z∈[+1659,+1779]mm · 9 of 28 slices shown, 12 images]
[im 2/28  brain]
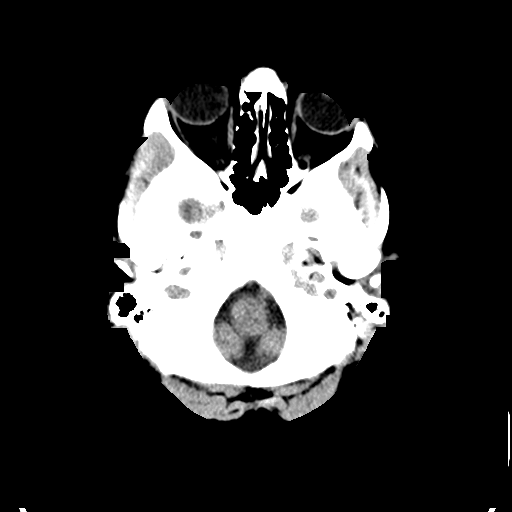
[im 2/28  bone]
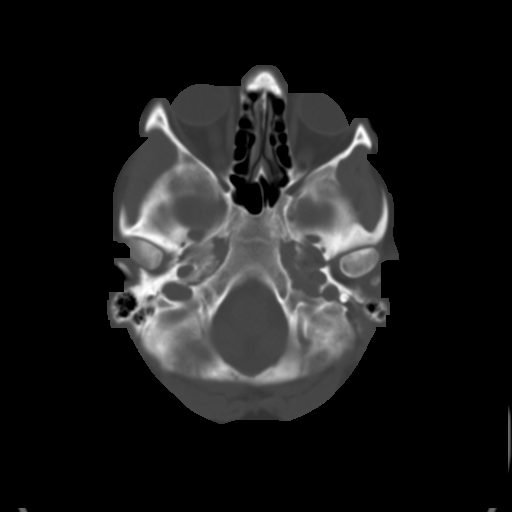
[im 6/28  brain]
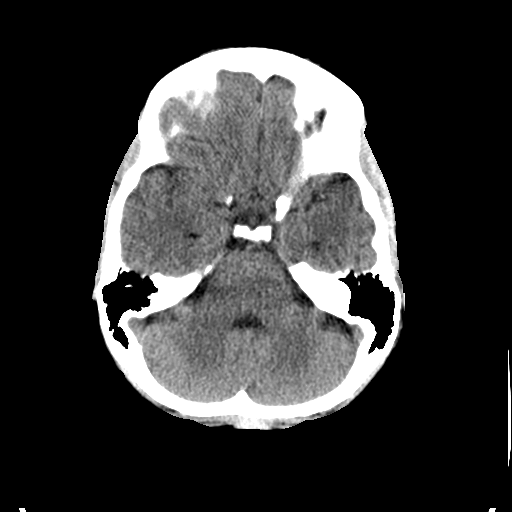
[im 8/28  brain]
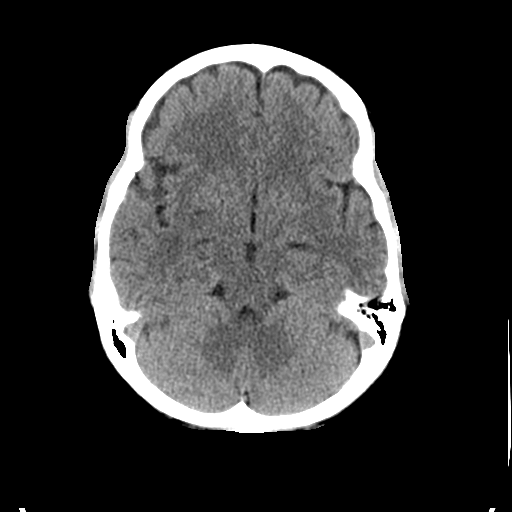
[im 12/28  brain]
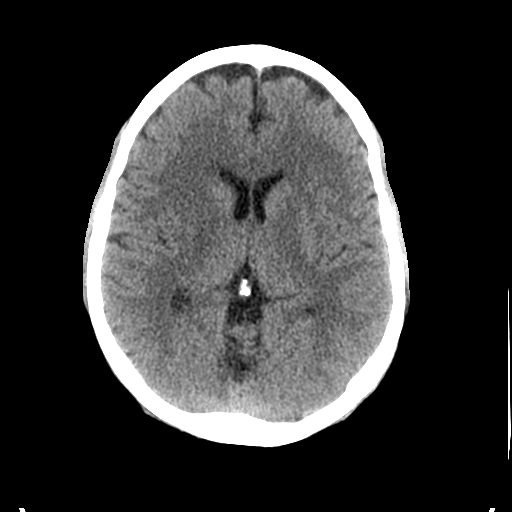
[im 14/28  brain]
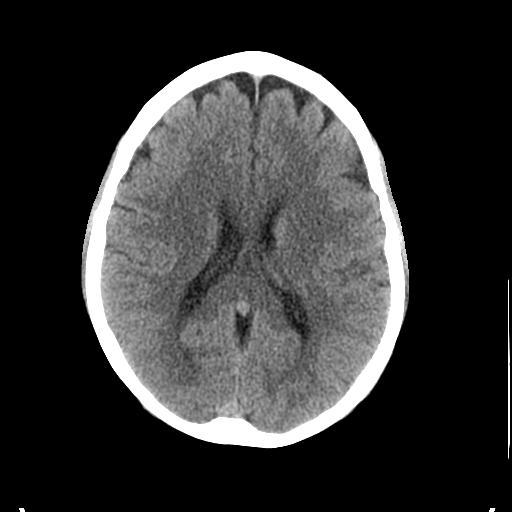
[im 14/28  bone]
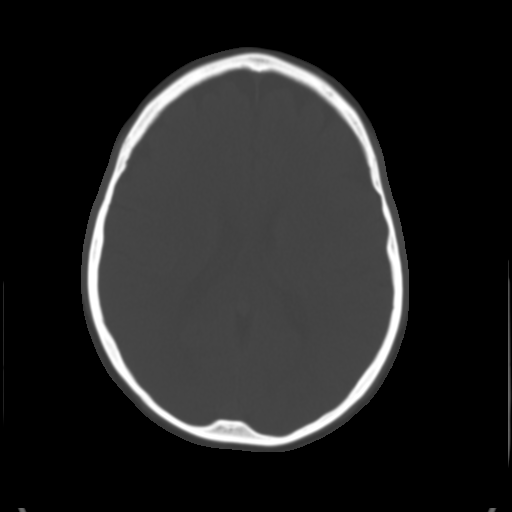
[im 16/28  brain]
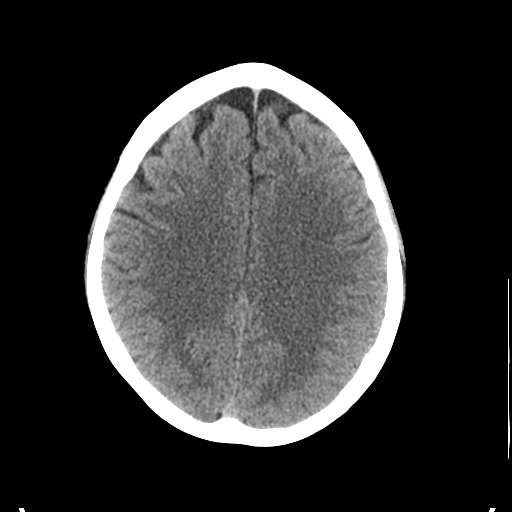
[im 20/28  brain]
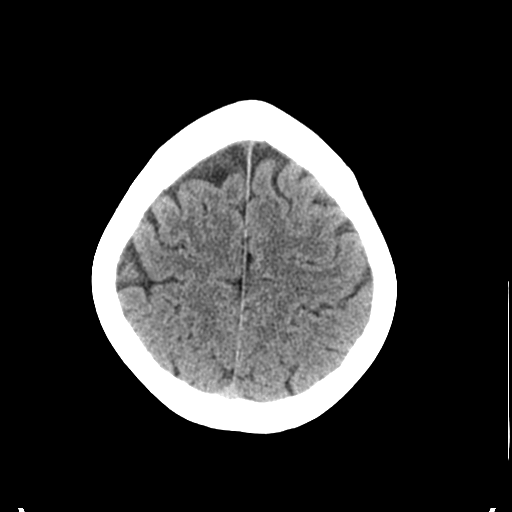
[im 22/28  brain]
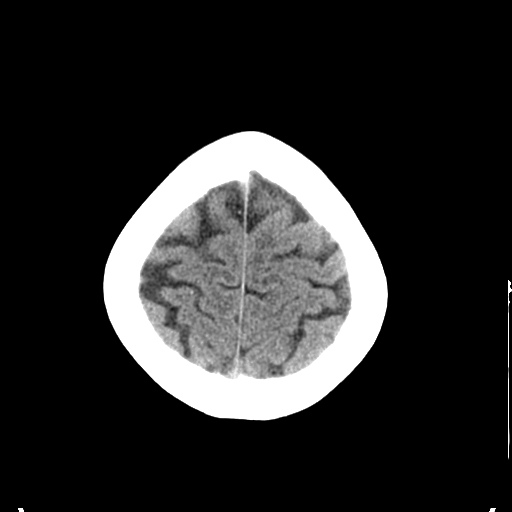
[im 26/28  brain]
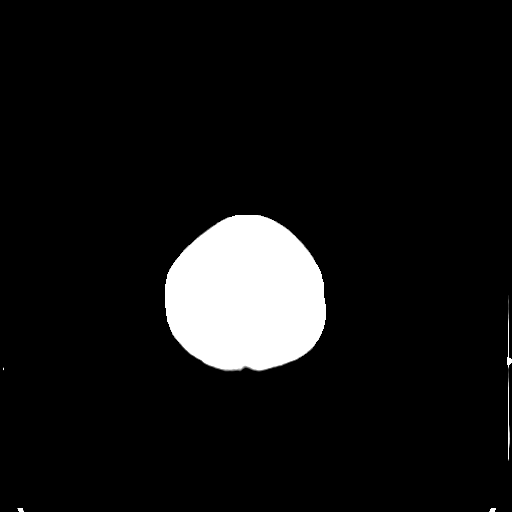
[im 26/28  bone]
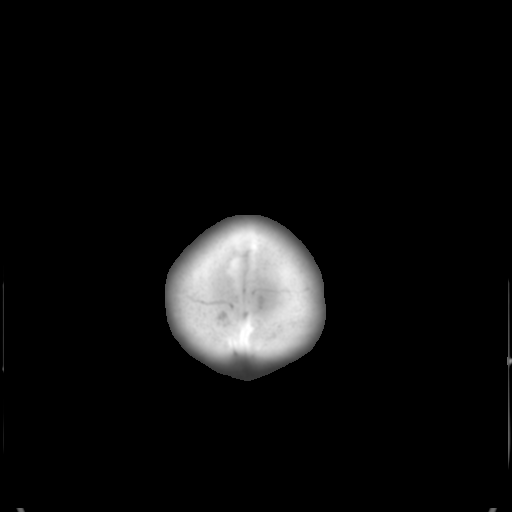

[Series 4: coronal · coronal · 0.28mm/px · 3 of 62 slices shown]
[im 21/62  brain]
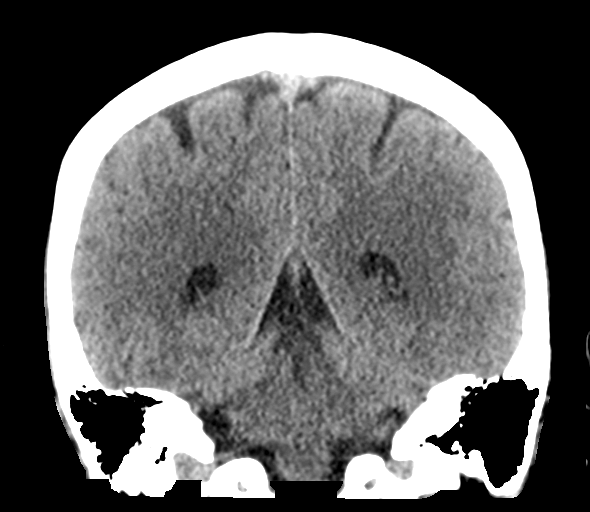
[im 28/62  brain]
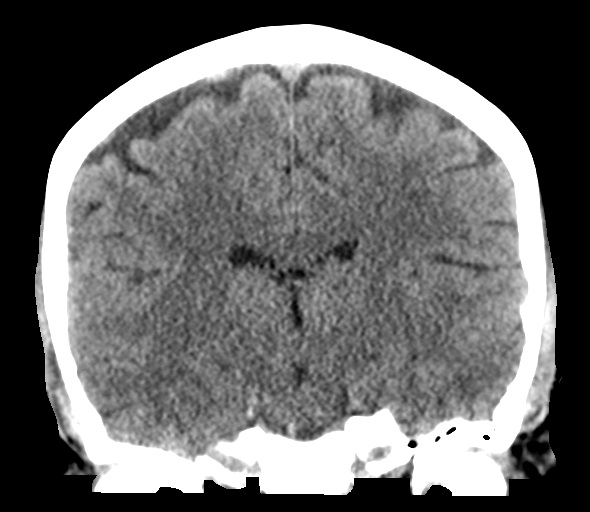
[im 34/62  brain]
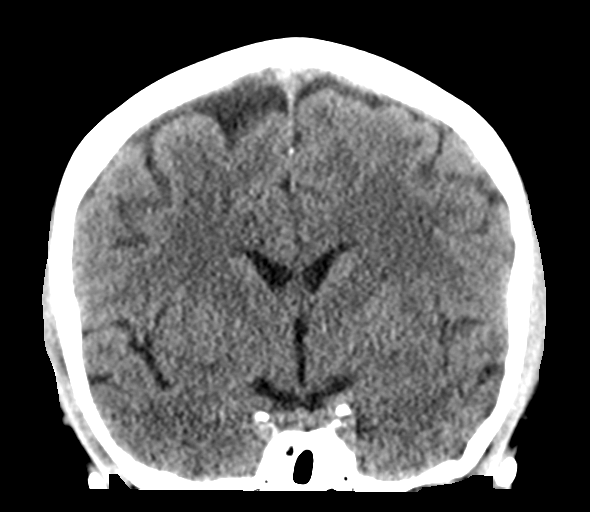

[Series 5: sagittal · sagittal · 0.28mm/px · 3 of 56 slices shown]
[im 19/56  brain]
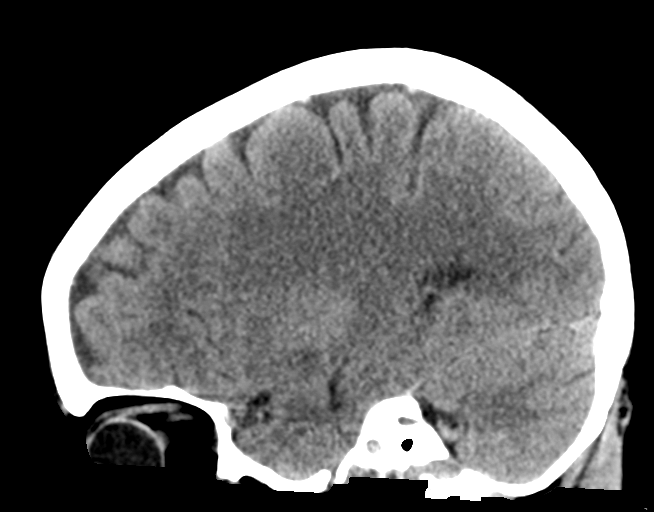
[im 28/56  brain]
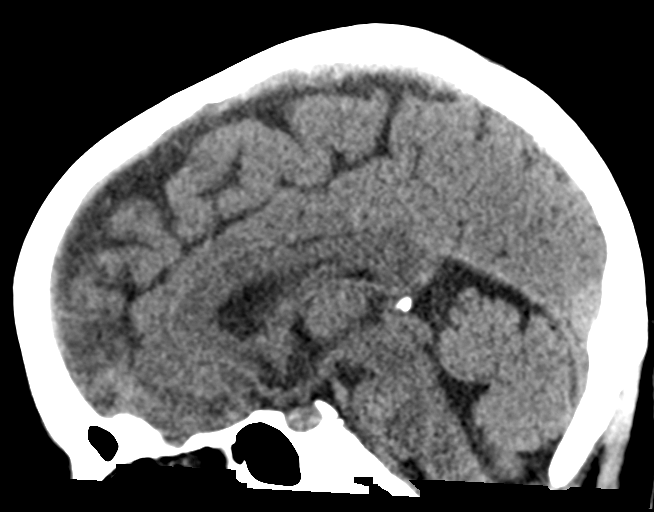
[im 37/56  brain]
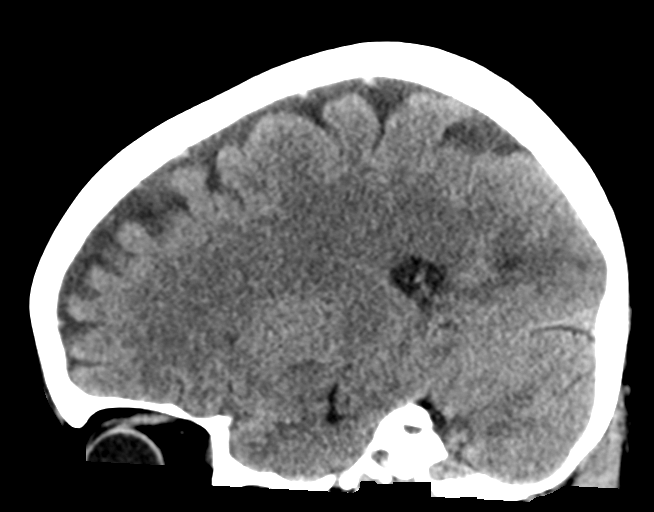

[15 of 47 positions shown; findings below may reference images not displayed]

FINDINGS: Brain: The ventricles and sulci appropriate size for patient's age.
The gray-white matter discrimination is preserved. There is no acute
intracranial hemorrhage. No mass effect or midline shift noted. No
extra-axial fluid collection.

Vascular: No hyperdense vessel or unexpected calcification.

Skull: Normal. Negative for fracture or focal lesion.

Sinuses/Orbits: No acute finding.

Other: None
IMPRESSION: Normal noncontrast CT of the brain.

## 2018-07-26 ENCOUNTER — Telehealth: Payer: Self-pay | Admitting: Family Medicine

## 2018-07-26 NOTE — Telephone Encounter (Signed)
Pt would like to transfer from Dr. Martinique to Dr. Ethlyn Gallery would this be okay?

## 2018-07-27 NOTE — Telephone Encounter (Signed)
ok 

## 2018-07-27 NOTE — Telephone Encounter (Signed)
Fine with me. Thanks, BJ 

## 2018-07-27 NOTE — Telephone Encounter (Signed)
lmom about the below msg and asked for a call to schedule the transfer appointment

## 2018-07-29 ENCOUNTER — Ambulatory Visit (INDEPENDENT_AMBULATORY_CARE_PROVIDER_SITE_OTHER): Payer: Medicare Other | Admitting: *Deleted

## 2018-07-29 DIAGNOSIS — Z23 Encounter for immunization: Secondary | ICD-10-CM

## 2018-08-22 NOTE — Telephone Encounter (Signed)
lmom for pt to call back to make that transfer of care appointment

## 2018-08-31 DIAGNOSIS — D225 Melanocytic nevi of trunk: Secondary | ICD-10-CM | POA: Diagnosis not present

## 2018-08-31 DIAGNOSIS — L821 Other seborrheic keratosis: Secondary | ICD-10-CM | POA: Diagnosis not present

## 2018-08-31 DIAGNOSIS — L570 Actinic keratosis: Secondary | ICD-10-CM | POA: Diagnosis not present

## 2018-08-31 DIAGNOSIS — Z85828 Personal history of other malignant neoplasm of skin: Secondary | ICD-10-CM | POA: Diagnosis not present

## 2018-08-31 DIAGNOSIS — D2271 Melanocytic nevi of right lower limb, including hip: Secondary | ICD-10-CM | POA: Diagnosis not present

## 2018-08-31 DIAGNOSIS — D1801 Hemangioma of skin and subcutaneous tissue: Secondary | ICD-10-CM | POA: Diagnosis not present

## 2018-09-23 NOTE — Telephone Encounter (Signed)
Pt did a transfer of care appointment with Dr. Ethlyn Gallery

## 2018-10-12 DIAGNOSIS — H5213 Myopia, bilateral: Secondary | ICD-10-CM | POA: Diagnosis not present

## 2018-10-12 DIAGNOSIS — H524 Presbyopia: Secondary | ICD-10-CM | POA: Diagnosis not present

## 2018-10-12 DIAGNOSIS — H52201 Unspecified astigmatism, right eye: Secondary | ICD-10-CM | POA: Diagnosis not present

## 2018-10-12 DIAGNOSIS — H2513 Age-related nuclear cataract, bilateral: Secondary | ICD-10-CM | POA: Diagnosis not present

## 2018-10-23 NOTE — Progress Notes (Signed)
Ellen Collins DOB: [birth year] Encounter date: 10/24/2018  This is a 67 y.o. female who presents to establish care. Chief Complaint  Patient presents with  . Allergic Rhinitis     History of present illness: No specific concerns today. Ear (left) started ringing after bad viral infection last year. Comes on and off. Lost sense of smell and taste for a while but this is back.   Allergic rhinitis:claritin. Has always had sinus issues. Took shots for 7 years. Continues with claritin. Did nasal spray (flonase) for years (and astelin) and then just stopped. Felt like she was more addicted to it.   Dysphagia:zantac, zofran. Feels like this was part of the panic attack problem. Felt like ambien made it harder for her to swallow. Never been able to swallow large pills. Worse with panic attacks. Stopped the zantac when pulled from the market. This works for her. She is not taking the zofran.  Lumbar disc disease w radiculopathy:ibuprofen, flexeril, gabapentin. Last spinal injection was last October (2018). Taking turmeric which she feels helps. Not even needing the flexeril any more.   Anxiety:?xanax use. Doing really well with this. Still wants to keep xanax rx. Has been taking 1/2 tablet of this at bedtime. Has a lot of end of year stress and this time of year is very difficult. Anxiety driven person. Was having bad panic attacks for awhile, followed with psychiatry.   Hyperlipidemia: Not on cholesterol medication. Taking fish oil. Wants to lose weight.   Getting estrace from obgyn. Still follows with them. She had preventative hysterectomy due to mother's history of ovarian cancer. She had endometriosis.   Insomnia: Can fall asleep, but not staying asleep. Has tried "everything". All the OTC medications.   Last bloodwork was 05/2017  Impaired fasting glucose with A1C: 6.1   Past Medical History:  Diagnosis Date  . Allergy   . Anxiety   . GERD (gastroesophageal reflux disease)   . Low back  pain radiating to both legs    worse of left side  . Paroxysmal SVT (supraventricular tachycardia) ([place])    she reports rare problems over years   Past Surgical History:  Procedure Laterality Date  . broken foot     right foot/ has pins  . COLONOSCOPY    . UPPER GASTROINTESTINAL ENDOSCOPY    . VAGINAL HYSTERECTOMY  1998   Allergies  Allergen Reactions  . Latex     Itching, and redness   No outpatient medications have been marked as taking for the 10/24/18 encounter (Office Visit) with Caren Macadam, MD.   Current Facility-Administered Medications for the 10/24/18 encounter (Office Visit) with Caren Macadam, MD  Medication  . 0.9 %  sodium chloride infusion   Social History   Tobacco Use  . Smoking status: Never Smoker  . Smokeless tobacco: Never Used  Substance Use Topics  . Alcohol use: Yes    Alcohol/week: 1.0 standard drinks    Types: 1 Glasses of wine per week    Comment: social   Family History  Problem Relation Age of Onset  . Colon cancer Father 59  . Ovarian cancer Mother      Review of Systems  Constitutional: Negative for chills, fatigue and fever.  Respiratory: Negative for cough, chest tightness, shortness of breath and wheezing.   Cardiovascular: Negative for chest pain, palpitations and leg swelling.    Objective:  BP 140/70   Pulse 96   Temp 98.4 F (36.9 C)   Ht 5\' 6"  (  1.676 m)   Wt 168 lb 12.8 oz (76.6 kg)   SpO2 98%   BMI 27.25 kg/m   Weight: 168 lb 12.8 oz (76.6 kg)   BP Readings from Last 3 Encounters:  10/24/18 140/70  05/27/17 112/69  05/17/17 (!) 142/80   Wt Readings from Last 3 Encounters:  10/24/18 168 lb 12.8 oz (76.6 kg)  05/26/17 169 lb (76.7 kg)  05/17/17 170 lb 8 oz (77.3 kg)    Physical Exam Constitutional:      General: She is not in acute distress.    Appearance: She is well-developed.  Cardiovascular:     Rate and Rhythm: Normal rate and regular rhythm.     Heart sounds: Normal heart sounds. No  murmur. No friction rub.     Comments: No lower extremity edema Pulmonary:     Effort: Pulmonary effort is normal. No respiratory distress.     Breath sounds: Normal breath sounds. No wheezing or rales.  Neurological:     Mental Status: She is alert and oriented to person, place, and time.  Psychiatric:        Behavior: Behavior normal.     Assessment/Plan: 1. Allergic rhinitis, unspecified seasonality, unspecified trigger Continue with antihistamine; prn flonase.  2. Mixed hyperlipidemia  - Comprehensive metabolic panel; Future - Lipid panel; Future - TSH; Future  3. Anxiety state Discussed using xanax sparingly (last rx was 2016) which she is doing. Limit use where able. Let me know if anxiety is worsening.  - ALPRAZolam (XANAX) 0.5 MG tablet; Take 0.5-1 tablets (0.25-0.5 mg total) by mouth as needed for anxiety.  Dispense: 30 tablet; Refill: 0  4. Insomnia, unspecified type Trial trazodone. Let me know how this works. OK to double up dose if 50mg  not effective. - traZODone (DESYREL) 50 MG tablet; Take 1 tablet (50 mg total) by mouth at bedtime.  Dispense: 30 tablet; Refill: 2  5. Lumbar disc disease with radiculopathy Doing extremely well post injection and with turmeric supplement. Plans to work on physical activity increase.  6. DYSPHAGIA Stable; more anxiety related. Pepcid as needed for GERD sx.  - famotidine (PEPCID) 20 MG tablet; Take 1 tablet (20 mg total) by mouth daily. - Turmeric 500 MG TABS; Take 500 mg by mouth daily.  7. Prediabetes - Hemoglobin A1c; Future - Comprehensive metabolic panel; Future - CBC with Differential/Platelet; Future  8. Dermatitis of external ear Has used the betamethasone for some time and finds it very effective. Uses in short bursts a few times/year. - betamethasone valerate lotion (VALISONE) 0.1 %; Apply 1 application topically 2 (two) times daily.  Dispense: 60 mL; Refill: 0  9. Basal cell carcinoma (BCC) of skin of other part  of face Follows with [facility].   Return in about 6 months (around 04/25/2019) for physical exam.  Micheline Rough, MD

## 2018-10-24 ENCOUNTER — Ambulatory Visit (INDEPENDENT_AMBULATORY_CARE_PROVIDER_SITE_OTHER): Payer: Medicare Other | Admitting: Family Medicine

## 2018-10-24 ENCOUNTER — Encounter: Payer: Self-pay | Admitting: Family Medicine

## 2018-10-24 VITALS — BP 140/70 | HR 96 | Temp 98.4°F | Ht 66.0 in | Wt 168.8 lb

## 2018-10-24 DIAGNOSIS — F411 Generalized anxiety disorder: Secondary | ICD-10-CM | POA: Diagnosis not present

## 2018-10-24 DIAGNOSIS — R7303 Prediabetes: Secondary | ICD-10-CM | POA: Diagnosis not present

## 2018-10-24 DIAGNOSIS — C44319 Basal cell carcinoma of skin of other parts of face: Secondary | ICD-10-CM | POA: Diagnosis not present

## 2018-10-24 DIAGNOSIS — G4700 Insomnia, unspecified: Secondary | ICD-10-CM

## 2018-10-24 DIAGNOSIS — J309 Allergic rhinitis, unspecified: Secondary | ICD-10-CM | POA: Diagnosis not present

## 2018-10-24 DIAGNOSIS — E782 Mixed hyperlipidemia: Secondary | ICD-10-CM

## 2018-10-24 DIAGNOSIS — M5116 Intervertebral disc disorders with radiculopathy, lumbar region: Secondary | ICD-10-CM | POA: Diagnosis not present

## 2018-10-24 DIAGNOSIS — R1319 Other dysphagia: Secondary | ICD-10-CM | POA: Diagnosis not present

## 2018-10-24 DIAGNOSIS — C4491 Basal cell carcinoma of skin, unspecified: Secondary | ICD-10-CM | POA: Insufficient documentation

## 2018-10-24 DIAGNOSIS — L309 Dermatitis, unspecified: Secondary | ICD-10-CM | POA: Diagnosis not present

## 2018-10-24 LAB — COMPREHENSIVE METABOLIC PANEL
ALT: 20 U/L (ref 0–35)
AST: 16 U/L (ref 0–37)
Albumin: 4.4 g/dL (ref 3.5–5.2)
Alkaline Phosphatase: 44 U/L (ref 39–117)
BUN: 13 mg/dL (ref 6–23)
CO2: 31 mEq/L (ref 19–32)
Calcium: 9.4 mg/dL (ref 8.4–10.5)
Chloride: 98 mEq/L (ref 96–112)
Creatinine, Ser: 0.76 mg/dL (ref 0.40–1.20)
GFR: 80.95 mL/min (ref >60.00)
Glucose, Bld: 101 mg/dL — ABNORMAL HIGH (ref 70–99)
Potassium: 4.1 mEq/L (ref 3.5–5.1)
Sodium: 137 mEq/L (ref 135–145)
Total Bilirubin: 0.5 mg/dL (ref 0.2–1.2)
Total Protein: 6.9 g/dL (ref 6.0–8.3)

## 2018-10-24 LAB — CBC WITH DIFFERENTIAL/PLATELET
Basophils Absolute: 0 10*3/uL (ref 0.0–0.1)
Basophils Relative: 0.6 % (ref 0.0–3.0)
Eosinophils Absolute: 0.2 10*3/uL (ref 0.0–0.7)
Eosinophils Relative: 4 % (ref 0.0–5.0)
HCT: 40.4 % (ref 36.0–46.0)
Hemoglobin: 13.7 g/dL (ref 12.0–15.0)
Lymphocytes Relative: 38.4 % (ref 12.0–46.0)
Lymphs Abs: 2.2 10*3/uL (ref 0.7–4.0)
MCHC: 33.8 g/dL (ref 30.0–36.0)
MCV: 94.1 fl (ref 78.0–100.0)
Monocytes Absolute: 0.5 10*3/uL (ref 0.1–1.0)
Monocytes Relative: 7.9 % (ref 3.0–12.0)
Neutro Abs: 2.8 10*3/uL (ref 1.4–7.7)
Neutrophils Relative %: 49.1 % (ref 43.0–77.0)
Platelets: 307 10*3/uL (ref 150.0–400.0)
RBC: 4.3 Mil/uL (ref 3.87–5.11)
RDW: 12.8 % (ref 11.5–15.5)
WBC: 5.8 10*3/uL (ref 4.0–10.5)

## 2018-10-24 LAB — LIPID PANEL
Cholesterol: 232 mg/dL — ABNORMAL HIGH (ref 0–200)
HDL: 64 mg/dL (ref >39.00)
LDL Cholesterol: 129 mg/dL — ABNORMAL HIGH (ref 0–99)
NonHDL: 167.91
Total CHOL/HDL Ratio: 4
Triglycerides: 196 mg/dL — ABNORMAL HIGH (ref 0.0–149.0)
VLDL: 39.2 mg/dL (ref 0.0–40.0)

## 2018-10-24 LAB — TSH: TSH: 1.31 u[IU]/mL (ref 0.35–4.50)

## 2018-10-24 LAB — HEMOGLOBIN A1C: Hgb A1c MFr Bld: 6.1 % (ref 4.6–6.5)

## 2018-10-24 MED ORDER — BETAMETHASONE VALERATE 0.1 % EX LOTN: 1.0000 "application " | TOPICAL_LOTION | Freq: Two times a day (BID) | CUTANEOUS | 0 refills | Status: DC

## 2018-10-24 MED ORDER — TRAZODONE HCL 50 MG PO TABS: 50.0000 mg | ORAL_TABLET | Freq: Every day | ORAL | 2 refills | Status: DC

## 2018-10-24 MED ORDER — TURMERIC 500 MG PO TABS: 500.0000 mg | ORAL_TABLET | Freq: Every day | ORAL | Status: AC

## 2018-10-24 MED ORDER — ALPRAZOLAM 0.5 MG PO TABS: 0.2500 mg | ORAL_TABLET | ORAL | 0 refills | Status: DC | PRN

## 2018-10-24 MED ORDER — FAMOTIDINE 20 MG PO TABS: 20.0000 mg | ORAL_TABLET | Freq: Every day | ORAL | Status: DC

## 2018-10-24 NOTE — Addendum Note (Signed)
Addended by: Gwynne Edinger on: 10/24/2018 10:15 AM   Modules accepted: Orders

## 2018-10-31 ENCOUNTER — Telehealth: Payer: Self-pay | Admitting: Family Medicine

## 2018-10-31 NOTE — Telephone Encounter (Signed)
Copied from [place] [id number]. Topic: Quick Communication - Lab Results (Clinic Use ONLY) >> Oct 28, 2018 10:06 AM Rebecca Eaton, CMA wrote: Called patient to inform them of 10/24/18 lab results. When patient returns call, triage nurse may disclose results.   pt is calling back requesting lab results

## 2018-10-31 NOTE — Telephone Encounter (Signed)
Charted in result notes. 

## 2018-11-16 DIAGNOSIS — Z1231 Encounter for screening mammogram for malignant neoplasm of breast: Secondary | ICD-10-CM | POA: Diagnosis not present

## 2018-11-16 LAB — HM PAP SMEAR

## 2018-11-16 LAB — HM MAMMOGRAPHY

## 2018-11-17 ENCOUNTER — Encounter: Payer: Self-pay | Admitting: Family Medicine

## 2018-11-22 ENCOUNTER — Encounter: Payer: Self-pay | Admitting: Family Medicine

## 2018-11-28 ENCOUNTER — Encounter: Payer: Self-pay | Admitting: Family Medicine

## 2018-11-28 DIAGNOSIS — N6312 Unspecified lump in the right breast, upper inner quadrant: Secondary | ICD-10-CM | POA: Diagnosis not present

## 2018-11-28 DIAGNOSIS — N6001 Solitary cyst of right breast: Secondary | ICD-10-CM | POA: Diagnosis not present

## 2018-11-28 DIAGNOSIS — R922 Inconclusive mammogram: Secondary | ICD-10-CM | POA: Diagnosis not present

## 2018-12-06 ENCOUNTER — Encounter: Payer: Self-pay | Admitting: Family Medicine

## 2018-12-07 NOTE — Telephone Encounter (Signed)
Can you call her and see if she has had flu exposure at all? Get more specifics on symptoms. Unless exposure I usually like to have her tested to make sure we know what we are treating.

## 2018-12-08 NOTE — Telephone Encounter (Signed)
Thanks for checking in [place]. I assume her husbands appointment is prior to his travels? Let [place] know if he needs anything in case his appt is after travels. No need to call her if this was already discussed.

## 2018-12-15 ENCOUNTER — Telehealth: Payer: Medicare Other | Admitting: Family

## 2018-12-15 DIAGNOSIS — B9689 Other specified bacterial agents as the cause of diseases classified elsewhere: Secondary | ICD-10-CM

## 2018-12-15 DIAGNOSIS — J208 Acute bronchitis due to other specified organisms: Secondary | ICD-10-CM | POA: Diagnosis not present

## 2018-12-15 NOTE — Progress Notes (Signed)
Greater than 5 minutes, yet less than 10 minutes of time have been spent researching, coordinating, and implementing care for this patient today.  Thank you for the details you included in the comment boxes. Those details are very helpful in determining the best course of treatment for you and help us to provide the best care.  We are sorry that you are not feeling well.  Here is how we plan to help!  Based on your presentation I believe you most likely have A cough due to bacteria.  When patients have a fever and a productive cough with a change in color or increased sputum production, we are concerned about bacterial bronchitis.  If left untreated it can progress to pneumonia.  If your symptoms do not improve with your treatment plan it is important that you contact your provider.   I have prescribed Doxycycline 100 mg twice a day for 7 days     In addition you may use A prescription cough medication called Tessalon Perles 100mg. You may take 1-2 capsules every 8 hours as needed for your cough.  I have also added an Albuterol inhaler, take 2 puffs every 6 hours as needed for shortness of breath.    From your responses in the eVisit questionnaire you describe inflammation in the upper respiratory tract which is causing a significant cough.  This is commonly called Bronchitis and has four common causes:    Allergies  Viral Infections  Acid Reflux  Bacterial Infection Allergies, viruses and acid reflux are treated by controlling symptoms or eliminating the cause. An example might be a cough caused by taking certain blood pressure medications. You stop the cough by changing the medication. Another example might be a cough caused by acid reflux. Controlling the reflux helps control the cough.  USE OF BRONCHODILATOR ("RESCUE") INHALERS: There is a risk from using your bronchodilator too frequently.  The risk is that over-reliance on a medication which only relaxes the muscles surrounding the  breathing tubes can reduce the effectiveness of medications prescribed to reduce swelling and congestion of the tubes themselves.  Although you feel brief relief from the bronchodilator inhaler, your asthma may actually be worsening with the tubes becoming more swollen and filled with mucus.  This can delay other crucial treatments, such as oral steroid medications. If you need to use a bronchodilator inhaler daily, several times per day, you should discuss this with your provider.  There are probably better treatments that could be used to keep your asthma under control.     HOME CARE . Only take medications as instructed by your medical team. . Complete the entire course of an antibiotic. . Drink plenty of fluids and get plenty of rest. . Avoid close contacts especially the very young and the elderly . Cover your mouth if you cough or cough into your sleeve. . Always remember to wash your hands . A steam or ultrasonic humidifier can help congestion.   GET HELP RIGHT AWAY IF: . You develop worsening fever. . You become short of breath . You cough up blood. . Your symptoms persist after you have completed your treatment plan MAKE SURE YOU   Understand these instructions.  Will watch your condition.  Will get help right away if you are not doing well or get worse.  Your e-visit answers were reviewed by a board certified advanced clinical practitioner to complete your personal care plan.  Depending on the condition, your plan could have included both over the   counter or prescription medications. If there is a problem please reply  once you have received a response from your provider. Your safety is important to us.  If you have drug allergies check your prescription carefully.    You can use MyChart to ask questions about today's visit, request a non-urgent call back, or ask for a work or school excuse for 24 hours related to this e-Visit. If it has been greater than 24 hours you will need  to follow up with your provider, or enter a new e-Visit to address those concerns. You will get an e-mail in the next two days asking about your experience.  I hope that your e-visit has been valuable and will speed your recovery. Thank you for using e-visits.   

## 2018-12-26 ENCOUNTER — Encounter: Payer: Self-pay | Admitting: Family Medicine

## 2019-03-16 ENCOUNTER — Other Ambulatory Visit: Payer: Self-pay | Admitting: Family Medicine

## 2019-03-16 DIAGNOSIS — G4700 Insomnia, unspecified: Secondary | ICD-10-CM

## 2019-04-24 ENCOUNTER — Telehealth: Payer: Medicare Other | Admitting: Physician Assistant

## 2019-04-24 ENCOUNTER — Encounter: Payer: Self-pay | Admitting: Family Medicine

## 2019-04-24 ENCOUNTER — Encounter: Payer: Self-pay | Admitting: Physician Assistant

## 2019-04-24 DIAGNOSIS — B9689 Other specified bacterial agents as the cause of diseases classified elsewhere: Secondary | ICD-10-CM | POA: Diagnosis not present

## 2019-04-24 DIAGNOSIS — J019 Acute sinusitis, unspecified: Secondary | ICD-10-CM

## 2019-04-24 MED ORDER — AMOXICILLIN-POT CLAVULANATE 875-125 MG PO TABS: 1.0000 | ORAL_TABLET | Freq: Two times a day (BID) | ORAL | 0 refills | Status: DC

## 2019-04-24 NOTE — Progress Notes (Signed)
We are sorry that you are not feeling well.  Here is how we plan to help!  Based on what you have shared with me it looks like you have sinusitis.  Sinusitis is inflammation and infection in the sinus cavities of the head.  Based on your presentation I believe you most likely have Acute Bacterial Sinusitis.  This is an infection caused by bacteria and is treated with antibiotics. I have prescribed Augmentin 875mg/125mg one tablet twice daily with food, for 7 days. You may use an oral decongestant such as Mucinex D or if you have glaucoma or high blood pressure use plain Mucinex. Saline nasal spray help and can safely be used as often as needed for congestion.  If you develop worsening sinus pain, fever or notice severe headache and vision changes, or if symptoms are not better after completion of antibiotic, please schedule an appointment with a health care provider.    Sinus infections are not as easily transmitted as other respiratory infection, however we still recommend that you avoid close contact with loved ones, especially the very young and elderly.  Remember to wash your hands thoroughly throughout the day as this is the number one way to prevent the spread of infection!  Home Care:  Only take medications as instructed by your medical team.  Complete the entire course of an antibiotic.  Do not take these medications with alcohol.  A steam or ultrasonic humidifier can help congestion.  You can place a towel over your head and breathe in the steam from hot water coming from a faucet.  Avoid close contacts especially the very young and the elderly.  Cover your mouth when you cough or sneeze.  Always remember to wash your hands.  Get Help Right Away If:  You develop worsening fever or sinus pain.  You develop a severe head ache or visual changes.  Your symptoms persist after you have completed your treatment plan.  Make sure you  Understand these instructions.  Will watch your  condition.  Will get help right away if you are not doing well or get worse.  Your e-visit answers were reviewed by a board certified advanced clinical practitioner to complete your personal care plan.  Depending on the condition, your plan could have included both over the counter or prescription medications.  If there is a problem please reply  once you have received a response from your provider.  Your safety is important to us.  If you have drug allergies check your prescription carefully.    You can use MyChart to ask questions about today's visit, request a non-urgent call back, or ask for a work or school excuse for 24 hours related to this e-Visit. If it has been greater than 24 hours you will need to follow up with your provider, or enter a new e-Visit to address those concerns.  You will get an e-mail in the next two days asking about your experience.  I hope that your e-visit has been valuable and will speed your recovery. Thank you for using e-visits.   I spent 5-10 minutes on review and completion of this note- Jazzma Neidhardt PAC  

## 2019-05-09 ENCOUNTER — Telehealth: Payer: Medicare Other | Admitting: Physician Assistant

## 2019-05-09 DIAGNOSIS — B9689 Other specified bacterial agents as the cause of diseases classified elsewhere: Secondary | ICD-10-CM

## 2019-05-09 DIAGNOSIS — J019 Acute sinusitis, unspecified: Secondary | ICD-10-CM | POA: Diagnosis not present

## 2019-05-09 MED ORDER — DOXYCYCLINE HYCLATE 100 MG PO CAPS: 100.0000 mg | ORAL_CAPSULE | Freq: Two times a day (BID) | ORAL | 0 refills | Status: AC

## 2019-05-09 NOTE — Progress Notes (Signed)
We are sorry that you are not feeling well.  Here is how we plan to help!  Based on what you have shared with me it looks like you have sinusitis.  Sinusitis is inflammation and infection in the sinus cavities of the head.  Based on your presentation I believe you most likely have Acute Bacterial Sinusitis.  This is an infection caused by bacteria and is treated with antibiotics. I have prescribed Doxycycline 100mg  by mouth twice a day for 10 days. You may use an oral decongestant such as Mucinex D or if you have glaucoma or high blood pressure use plain Mucinex. Saline nasal spray help and can safely be used as often as needed for congestion.  If you develop worsening sinus pain, fever or notice severe headache and vision changes, or if symptoms are not better after completion of antibiotic, please schedule an appointment with a health care provider. Prednisone is not appropriate for the treatment of sinusitis.    Sinus infections are not as easily transmitted as other respiratory infection, however we still recommend that you avoid close contact with loved ones, especially the very young and elderly.  Remember to wash your hands thoroughly throughout the day as this is the number one way to prevent the spread of infection!  Home Care: Only take medications as instructed by your medical team. Complete the entire course of an antibiotic. Do not take these medications with alcohol. A steam or ultrasonic humidifier can help congestion.  You can place a towel over your head and breathe in the steam from hot water coming from a faucet. Avoid close contacts especially the very young and the elderly. Cover your mouth when you cough or sneeze. Always remember to wash your hands.  Get Help Right Away If: You develop worsening fever or sinus pain. You develop a severe head ache or visual changes. Your symptoms persist after you have completed your treatment plan.  Make sure you Understand these  instructions. Will watch your condition. Will get help right away if you are not doing well or get worse.  Your e-visit answers were reviewed by a board certified advanced clinical practitioner to complete your personal care plan.  Depending on the condition, your plan could have included both over the counter or prescription medications.  If there is a problem please reply once you have received a response from your provider.  Your safety is important to [place].  If you have drug allergies check your prescription carefully.    You can use MyChart to ask questions about today's visit, request a non-urgent call back, or ask for a work or school excuse for 24 hours related to this e-Visit. If it has been greater than 24 hours you will need to follow up with your provider, or enter a new e-Visit to address those concerns.  You will get an e-mail in the next two days asking about your experience.  I hope that your e-visit has been valuable and will speed your recovery. Thank you for using e-visits.    ===View-only below this line===   ----- Message -----    From: [name]    Sent: 05/09/2019  1:30 PM EDT      To: E-Visit Mailing List Subject: Sinus Problems  Sinus Problems --------------------------------  Question: Which of the following have you been experiencing? Answer:   Pain around the nose and face            Ear pain  Neck pain  Question: Have these symptoms significantly worsened over the last two to three days? Answer:   Yes  Question: Have you had any of the following? Answer:   None of the above  Question: How long have you been having these symptoms? Answer:   7 or more days  Question: Do you have a fever? Answer:   No, I do not have a fever  Question: Do you smoke? Answer:   No  Question: Have you ever smoked? Answer:   I have never smoked  Question: Do you have any chronic illnesses, such as diabetes, heart disease, kidney disease, or lung  disease, or any illness that would weaken your body's ability to fight infection? Answer:   No  Question: When you blow your nose, what color is the mucus? Answer:   Mostly clear  Question: Have you experienced similar problems in the past? Answer:   Yes  Question: What treatments have worked in the past?  Answer:   Antibiotics, Prednisone, Cortisone injections  Question: What treatment(s) in the past have been unsuccessful? Answer:   Have tried numerous over the counter sinus/cold/congestion remedies.  Nyquil, Tylenol Sinus, Flonase, Mucinex  Question: Is this illness similar to previous illnesses you have had?  How is it the same?  How is it different? Answer:   Yes.  I have a past history of sinus problems which seem to have lessened but in the past year have been bothering me again. I was a patient of Dr. Velora Heckler for years receiving allergic injections.  Question: Have you recently been hospitalized? Answer:   No  Question: What medications are you currently taking for these symptoms? Answer:   Decongestants            Pain medicine            Nose spray  Question: Please enter the names of any medications you are taking, or any other treatments you are trying. Answer:   I take vitamin supplements, the only prescribed medication is estradiol.  Question: Please list your medication allergies that you may have ? (If 'none' , please list as 'none') Answer:   None  Question: Please list any additional comments  Answer:   I completed a round of antibiotics started on 04/24/19 per e-visit with S. Alene Mires.  I felt a little better for a couple of days but now my neck at the back of my ear lobe and face in front of ear and cheek bone and top left head pain.  At times the ear itself hurts.  Would it be possible to get a prescription for Prednisone.  I have found this helps me in the past.  Do you think I need another round of the antibiotic?  I was prescribed Amoxicillin-Clav 875-125Mg  tab.   Thank you for your help.

## 2019-05-15 ENCOUNTER — Telehealth: Payer: Self-pay | Admitting: Family Medicine

## 2019-05-15 NOTE — Telephone Encounter (Signed)
This was sent to the incorrect office. Please advise

## 2019-05-15 NOTE — Telephone Encounter (Signed)
Patient is calling to schedule Virtual Appt with Dr. Ethlyn Gallery on Wednesdday [phone]

## 2019-05-16 ENCOUNTER — Telehealth: Payer: Self-pay | Admitting: *Deleted

## 2019-05-16 ENCOUNTER — Encounter: Payer: Self-pay | Admitting: Family Medicine

## 2019-05-16 ENCOUNTER — Ambulatory Visit (INDEPENDENT_AMBULATORY_CARE_PROVIDER_SITE_OTHER): Payer: Medicare Other | Admitting: Family Medicine

## 2019-05-16 ENCOUNTER — Other Ambulatory Visit: Payer: Self-pay

## 2019-05-16 DIAGNOSIS — Z20822 Contact with and (suspected) exposure to covid-19: Secondary | ICD-10-CM

## 2019-05-16 DIAGNOSIS — J329 Chronic sinusitis, unspecified: Secondary | ICD-10-CM | POA: Diagnosis not present

## 2019-05-16 DIAGNOSIS — J310 Chronic rhinitis: Secondary | ICD-10-CM

## 2019-05-16 NOTE — Telephone Encounter (Signed)
-----   Message from Lucretia Kern, DO sent at 05/16/2019 11:45 AM EDT ----- Please test for COVID19. Sinus congestion, sinus pain, husband works around others and is getting tested.

## 2019-05-16 NOTE — Telephone Encounter (Signed)
Scheduled patient for COVID 19 test tomorrow at 10:15 am.  Testing protocol reviewed.

## 2019-05-16 NOTE — Patient Instructions (Signed)
-  complete the doxycycline  -nasal saline twice daily   -flonase 2 sprays each nostril daily for 4 weeks  -zyrtec once daily  -follow up in 2-3 weeks with Dr. Ethlyn Gallery or me  I have asked my assistant to order Coronavirus (COVID19) testing for you. Please call our office if you have any concerns or questions or this testing has not been arranged in the next 24-48 hours.   Self Isolate: -see the CDC site for information:   RunningShows.co.za.html   -stay home except for to seek medical care -stay in your own room away from others in your house, wash hands frequently, wear a mask if you leave your room and interacted as little as possible with others -seek medical care if worsening - call out office for a visit or call ahead if going elsewhere -seek emergency care if very sick or severe symptoms - call 911 -isolate for at least 10 days from the onset of symptoms PLUS 3 days of no fever PLUS 3 days of improving symptoms, unless instructed otherwise by a doctor.   Follow up in 2 days. Sooner as needed.

## 2019-05-16 NOTE — Progress Notes (Signed)
Virtual Visit via Video Note  I connected with Ellen Collins  on 05/16/19 at 11:40 AM EDT by a video enabled telemedicine application and verified that I am speaking with the correct person using two identifiers.  Location patient: home Location provider:work or home office Persons participating in the virtual visit: patient, provider  I discussed the limitations of evaluation and management by telemedicine and the availability of in person appointments. The patient expressed understanding and agreed to proceed.   HPI:  Acute visit for Sinus Congestion: -has history of severe allergies and was on allergy shots for years, then stopped a few years ago -symptoms started 1.5 months ago and  congestion, L max facial pain, drainage -did several evisits and did 5 days of amox, and now is on day 7/10 of doxy -no thick mucus, cough, SOB, NVD, rash, worsening or severe symptoms -husband works in [employer] and will be getting COVID19 testing today for bowel issues and fevers -no nasal steroid (they made her tired in the past), no antihistamine, no nasal saline -Temp today: 98, Blood pressure 125 /76 -reports prior PCP would give her a course of prednisone for this and wonders if this would help  ROS: See pertinent positives and negatives per HPI.  Past Medical History:  Diagnosis Date  . Allergy   . Anxiety   . GERD (gastroesophageal reflux disease)   . Low back pain radiating to both legs    worse of left side  . Paroxysmal SVT (supraventricular tachycardia) ([place])    she reports rare problems over years    Past Surgical History:  Procedure Laterality Date  . broken foot     right foot/ has pins  . COLONOSCOPY    . UPPER GASTROINTESTINAL ENDOSCOPY    . VAGINAL HYSTERECTOMY  1998    Family History  Problem Relation Age of Onset  . Colon cancer Father 28  . Ovarian cancer Mother     SOCIAL HX: see hpi   Current Outpatient Medications:  .  acetaminophen (TYLENOL) 500 MG tablet, Take  500 mg by mouth every 6 (six) hours as needed for moderate pain., Disp: , Rfl:  .  ALPRAZolam (XANAX) 0.5 MG tablet, Take 0.5-1 tablets (0.25-0.5 mg total) by mouth as needed for anxiety., Disp: 30 tablet, Rfl: 0 .  betamethasone valerate lotion (VALISONE) 0.1 %, Apply 1 application topically 2 (two) times daily., Disp: 60 mL, Rfl: 0 .  cholecalciferol (VITAMIN D) 1000 UNITS tablet, Take 2,000 Units by mouth daily. , Disp: , Rfl:  .  doxycycline (VIBRAMYCIN) 100 MG capsule, Take 1 capsule (100 mg total) by mouth 2 (two) times daily for 10 days., Disp: 20 capsule, Rfl: 0 .  estradiol (ESTRACE) 1 MG tablet, Take 1 mg by mouth daily., Disp: , Rfl:  .  famotidine (PEPCID) 20 MG tablet, Take 1 tablet (20 mg total) by mouth daily., Disp: , Rfl:  .  ibuprofen (ADVIL,MOTRIN) 200 MG tablet, Take 200 mg by mouth every 8 (eight) hours as needed., Disp: , Rfl:  .  loratadine (CLARITIN) 10 MG tablet, Take 10 mg by mouth as needed. allergies, Disp: , Rfl:  .  Multiple Vitamins-Minerals (MULTIVITAMIN WITH MINERALS) tablet, Take 1 tablet by mouth daily., Disp: , Rfl:  .  NON FORMULARY, Viviscal hair vitamin-Take one daily, Disp: , Rfl:  .  Omega-3 Fatty Acids (FISH OIL) 1000 MG CAPS, Take 1 capsule by mouth daily. Take 350 mg daily, Disp: , Rfl:  .  traZODone (DESYREL) 50 MG tablet,  Take 1 tablet (50 mg total) by mouth at bedtime., Disp: 30 tablet, Rfl: 2 .  Turmeric 500 MG TABS, Take 500 mg by mouth daily., Disp: , Rfl:   Current Facility-Administered Medications:  .  0.9 %  sodium chloride infusion, 500 mL, Intravenous, Continuous, Gatha Mayer, MD  EXAM:  VITALS per patient if applicable: denies fever 98, Blood pressure 125 /76  GENERAL: alert, oriented, appears well and in no acute distress  HEENT: atraumatic,  no obvious abnormalities on inspection of external nose and ears, no nasal drainage, allergic shiners, nose and conjunctiva mildly erythematous  NECK: normal movements of the head and  neck  LUNGS: on inspection no signs of respiratory distress, breathing rate appears normal, no obvious gross SOB, gasping or wheezing  CV: no obvious cyanosis  MS: moves all visible extremities without noticeable abnormality  PSYCH/NEURO: pleasant and cooperative, no obvious depression or anxiety, speech and thought processing grossly intact  ASSESSMENT AND PLAN: More than 50% of over 25 minutes spent in total in caring for this patient was spent counseling and/or coordinating care.   Discussed the following assessment and plan:  Rhinosinusitis -  -we discussed possible serious and likely etiologies, workup and treatment, treatment risks and return precautions. 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  -after this discussion, Kyaira opted for: -start nasal saline twice daily leaning to the R -start flonase 2 sprays each nostril daily for 4 weeks (discussed risks of oral steroids and she opted to try topical) -zyrtec once daily -COVID19 testing given situation with husband, sent referral to [facility] and discussed home quarantine with them -follow up advised in 2-3 weeks -of course, we advised Breeanne  to return or notify a doctor immediately if symptoms worsen or persist or new concerns arise.   I discussed the assessment and treatment plan with the patient. The patient was provided an opportunity to ask questions and all were answered. The patient agreed with the plan and demonstrated an understanding of the instructions.   The patient was advised to call back or seek an in-person evaluation if the symptoms worsen or if the condition fails to improve as anticipated.   Follow up instructions: Advised assistant Wendie Simmer to help patient arrange the following: -follow up in 2-3 weeks -I sent referral to [place], DO

## 2019-05-16 NOTE — Telephone Encounter (Signed)
Message sent to the [facility] for COVID test.

## 2019-05-17 ENCOUNTER — Other Ambulatory Visit: Payer: Medicare Other

## 2019-05-17 DIAGNOSIS — R6889 Other general symptoms and signs: Secondary | ICD-10-CM | POA: Diagnosis not present

## 2019-05-17 DIAGNOSIS — Z20822 Contact with and (suspected) exposure to covid-19: Secondary | ICD-10-CM

## 2019-05-23 LAB — NOVEL CORONAVIRUS, NAA: SARS-CoV-2, NAA: NOT DETECTED

## 2019-05-24 ENCOUNTER — Encounter: Payer: Self-pay | Admitting: Family Medicine

## 2019-05-24 DIAGNOSIS — R519 Headache, unspecified: Secondary | ICD-10-CM

## 2019-05-24 DIAGNOSIS — J3489 Other specified disorders of nose and nasal sinuses: Secondary | ICD-10-CM

## 2019-05-25 NOTE — Telephone Encounter (Signed)
I would be ok with putting in ENT referral for her, but not sure she will be able to get in as quickly as she would like? Her last visit was virtual with [facility]. If there are any acute slots in office today perhaps it would be a good idea for her to be seen in office and evaluated?   OK to put in ENT referral for her or help schedule acute visit today. I think in office better due to ongoing/worsening sx.

## 2019-05-25 NOTE — Telephone Encounter (Signed)
Message sent to [facility] for STAT referral.

## 2019-05-29 DIAGNOSIS — H9312 Tinnitus, left ear: Secondary | ICD-10-CM | POA: Diagnosis not present

## 2019-05-29 DIAGNOSIS — H93292 Other abnormal auditory perceptions, left ear: Secondary | ICD-10-CM | POA: Diagnosis not present

## 2019-05-29 DIAGNOSIS — J342 Deviated nasal septum: Secondary | ICD-10-CM | POA: Diagnosis not present

## 2019-05-29 DIAGNOSIS — M26629 Arthralgia of temporomandibular joint, unspecified side: Secondary | ICD-10-CM | POA: Diagnosis not present

## 2019-05-29 DIAGNOSIS — H9193 Unspecified hearing loss, bilateral: Secondary | ICD-10-CM | POA: Diagnosis not present

## 2019-05-29 DIAGNOSIS — J3489 Other specified disorders of nose and nasal sinuses: Secondary | ICD-10-CM | POA: Diagnosis not present

## 2019-05-29 DIAGNOSIS — R0981 Nasal congestion: Secondary | ICD-10-CM | POA: Diagnosis not present

## 2019-05-29 DIAGNOSIS — H938X2 Other specified disorders of left ear: Secondary | ICD-10-CM | POA: Diagnosis not present

## 2019-06-28 ENCOUNTER — Ambulatory Visit: Payer: Medicare Other | Admitting: Family Medicine

## 2019-07-08 DIAGNOSIS — Z23 Encounter for immunization: Secondary | ICD-10-CM | POA: Diagnosis not present

## 2019-07-13 ENCOUNTER — Ambulatory Visit: Payer: Medicare Other

## 2019-08-02 ENCOUNTER — Other Ambulatory Visit: Payer: Self-pay | Admitting: Family Medicine

## 2019-08-02 DIAGNOSIS — G4700 Insomnia, unspecified: Secondary | ICD-10-CM

## 2019-09-25 ENCOUNTER — Other Ambulatory Visit: Payer: Self-pay

## 2019-09-25 ENCOUNTER — Encounter: Payer: Self-pay | Admitting: Family Medicine

## 2019-09-25 ENCOUNTER — Ambulatory Visit (INDEPENDENT_AMBULATORY_CARE_PROVIDER_SITE_OTHER): Payer: Medicare Other | Admitting: Family Medicine

## 2019-09-25 VITALS — BP 122/78 | HR 94 | Temp 97.3°F | Ht 66.0 in | Wt 175.4 lb

## 2019-09-25 DIAGNOSIS — C44319 Basal cell carcinoma of skin of other parts of face: Secondary | ICD-10-CM | POA: Diagnosis not present

## 2019-09-25 DIAGNOSIS — R5383 Other fatigue: Secondary | ICD-10-CM | POA: Diagnosis not present

## 2019-09-25 DIAGNOSIS — M5116 Intervertebral disc disorders with radiculopathy, lumbar region: Secondary | ICD-10-CM | POA: Diagnosis not present

## 2019-09-25 DIAGNOSIS — E782 Mixed hyperlipidemia: Secondary | ICD-10-CM

## 2019-09-25 DIAGNOSIS — R739 Hyperglycemia, unspecified: Secondary | ICD-10-CM | POA: Diagnosis not present

## 2019-09-25 DIAGNOSIS — J309 Allergic rhinitis, unspecified: Secondary | ICD-10-CM

## 2019-09-25 DIAGNOSIS — Z23 Encounter for immunization: Secondary | ICD-10-CM

## 2019-09-25 DIAGNOSIS — G4700 Insomnia, unspecified: Secondary | ICD-10-CM

## 2019-09-25 LAB — COMPREHENSIVE METABOLIC PANEL
ALT: 18 U/L (ref 0–35)
AST: 16 U/L (ref 0–37)
Albumin: 4.5 g/dL (ref 3.5–5.2)
Alkaline Phosphatase: 49 U/L (ref 39–117)
BUN: 17 mg/dL (ref 6–23)
CO2: 27 mEq/L (ref 19–32)
Calcium: 9.3 mg/dL (ref 8.4–10.5)
Chloride: 99 mEq/L (ref 96–112)
Creatinine, Ser: 0.84 mg/dL (ref 0.40–1.20)
GFR: 67.67 mL/min (ref >60.00)
Glucose, Bld: 117 mg/dL — ABNORMAL HIGH (ref 70–99)
Potassium: 4.1 mEq/L (ref 3.5–5.1)
Sodium: 137 mEq/L (ref 135–145)
Total Bilirubin: 0.6 mg/dL (ref 0.2–1.2)
Total Protein: 7.2 g/dL (ref 6.0–8.3)

## 2019-09-25 LAB — CBC WITH DIFFERENTIAL/PLATELET
Basophils Absolute: 0 10*3/uL (ref 0.0–0.1)
Basophils Relative: 0.7 % (ref 0.0–3.0)
Eosinophils Absolute: 0.2 10*3/uL (ref 0.0–0.7)
Eosinophils Relative: 2.4 % (ref 0.0–5.0)
HCT: 39 % (ref 36.0–46.0)
Hemoglobin: 13.3 g/dL (ref 12.0–15.0)
Lymphocytes Relative: 36.3 % (ref 12.0–46.0)
Lymphs Abs: 2.5 10*3/uL (ref 0.7–4.0)
MCHC: 34.1 g/dL (ref 30.0–36.0)
MCV: 93 fl (ref 78.0–100.0)
Monocytes Absolute: 0.6 10*3/uL (ref 0.1–1.0)
Monocytes Relative: 8.5 % (ref 3.0–12.0)
Neutro Abs: 3.6 10*3/uL (ref 1.4–7.7)
Neutrophils Relative %: 52.1 % (ref 43.0–77.0)
Platelets: 319 10*3/uL (ref 150.0–400.0)
RBC: 4.19 Mil/uL (ref 3.87–5.11)
RDW: 12.7 % (ref 11.5–15.5)
WBC: 7 10*3/uL (ref 4.0–10.5)

## 2019-09-25 LAB — TSH: TSH: 1.68 u[IU]/mL (ref 0.35–4.50)

## 2019-09-25 LAB — LIPID PANEL
Cholesterol: 220 mg/dL — ABNORMAL HIGH (ref 0–200)
HDL: 59 mg/dL (ref >39.00)
NonHDL: 160.78
Total CHOL/HDL Ratio: 4
Triglycerides: 204 mg/dL — ABNORMAL HIGH (ref 0.0–149.0)
VLDL: 40.8 mg/dL — ABNORMAL HIGH (ref 0.0–40.0)

## 2019-09-25 LAB — LDL CHOLESTEROL, DIRECT: Direct LDL: 126 mg/dL

## 2019-09-25 LAB — HEMOGLOBIN A1C: Hgb A1c MFr Bld: 6.2 % (ref 4.6–6.5)

## 2019-09-25 MED ORDER — SHINGRIX 50 MCG/0.5ML IM SUSR: 0.5000 mL | Freq: Once | INTRAMUSCULAR | 0 refills | Status: AC

## 2019-09-25 NOTE — Progress Notes (Signed)
Ellen Collins DOB: [birth year] Encounter date: 09/25/2019  This is a 68 y.o. female who presents with Chief Complaint  Patient presents with  . Follow-up    History of present illness: Husband wanted her to tell us how tired she is. Not sleeping well. Trying to do so much all day at home. Thinks that it might be normal for age.    Allergic rhinitis:claritin. Has always had sinus issues. Took shots for 7 years. Continues with claritin. Did nasal spray (flonase) for years (and astelin) and then just stopped. Felt like she was more addicted to it.   Dysphagia:zantac, zofran. Feels like this was part of the panic attack problem. Felt like ambien made it harder for her to swallow. Never been able to swallow large pills. Worse with panic attacks. Stopped the zantac when pulled from the market. This works for her.   Lumbar disc disease w radiculopathy: still taking turmeric. Hasn't had shot in 2 years; still doing pretty well with this.   Anxiety:?xanax use. Doing really well with this. Still wants to keep xanax rx. Has been taking 1/2 tablet of this at bedtime. Has a lot of end of year stress and this time of year is very difficult. Anxiety driven person. Was having bad panic attacks for awhile, followed with psychiatry. Worried about cousin; hospitalized with COVID. She is intubated. She has been hospitalized for a month. Stress just comes and goes.   Hyperlipidemia: Not on cholesterol medication. Taking fish oil. Wants to lose weight.   Getting estrace from obgyn. Still follows with them. She had preventative hysterectomy due to mother's history of ovarian cancer. She had endometriosis. Follows with Dr. Melba Coon.   Insomnia: last visit we started trazodone - She had nightmares on this medication. Would sleep hard and then wake up early. Just hasn't worked as well for her. Has tried liquid sleep aid. Best sleep is with half of ambien.   Prediabetes: due for bloodwork  Hx of skin cancer:  follows with Dr. Martinique  Mammogram due end of December.   Not sure when last dexa was; but had one with Dr. Sherren Mocha. Sister does have osteoporosis.   Allergies  Allergen Reactions  . Latex     Itching, and redness   Current Meds  Medication Sig  . ALPRAZolam (XANAX) 0.5 MG tablet Take 0.5-1 tablets (0.25-0.5 mg total) by mouth as needed for anxiety.  . betamethasone valerate lotion (VALISONE) 0.1 % Apply 1 application topically 2 (two) times daily.  . cholecalciferol (VITAMIN D) 1000 UNITS tablet Take 2,000 Units by mouth daily.   Marland Kitchen estradiol (ESTRACE) 1 MG tablet Take 1 mg by mouth daily.  . famotidine (PEPCID) 20 MG tablet Take 1 tablet (20 mg total) by mouth daily.  . fluticasone (FLONASE) 50 MCG/ACT nasal spray Place 1 spray into both nostrils daily.  Marland Kitchen loratadine (CLARITIN) 10 MG tablet Take 5 mg by mouth as needed. allergies   . Multiple Vitamins-Minerals (MULTIVITAMIN WITH MINERALS) tablet Take 1 tablet by mouth daily.  . NON FORMULARY Viviscal hair vitamin-Take one daily  . Omega-3 Fatty Acids (FISH OIL) 1000 MG CAPS Take 1 capsule by mouth daily. Take 350 mg daily  . traZODone (DESYREL) 50 MG tablet TAKE 1 TABLET BY MOUTH EVERYDAY AT BEDTIME  . Turmeric 500 MG TABS Take 500 mg by mouth daily.  . [DISCONTINUED] acetaminophen (TYLENOL) 500 MG tablet Take 500 mg by mouth every 6 (six) hours as needed for moderate pain.  . [DISCONTINUED] ibuprofen (ADVIL,MOTRIN) 200  MG tablet Take 200 mg by mouth every 8 (eight) hours as needed.   Current Facility-Administered Medications for the 09/25/19 encounter (Office Visit) with Caren Macadam, MD  Medication  . 0.9 %  sodium chloride infusion    Review of Systems  Constitutional: Negative for chills, fatigue and fever.  Respiratory: Negative for cough, chest tightness, shortness of breath and wheezing.   Cardiovascular: Negative for chest pain, palpitations and leg swelling.  Gastrointestinal: Negative for abdominal distention,  abdominal pain, constipation, diarrhea, nausea and vomiting.  Genitourinary: Negative for decreased urine volume and difficulty urinating.  Musculoskeletal: Negative for arthralgias. Back pain: doing pretty well with this now.    Objective:  BP 122/78 (BP Location: Left Arm, Patient Position: Sitting, Cuff Size: Normal)   Pulse 94   Temp (!) 97.3 F (36.3 C) (Temporal)   Ht 5\' 6"  (1.676 m)   Wt 175 lb 6.4 oz (79.6 kg)   SpO2 97%   BMI 28.31 kg/m   Weight: 175 lb 6.4 oz (79.6 kg)   BP Readings from Last 3 Encounters:  09/25/19 122/78  10/24/18 140/70  05/27/17 112/69   Wt Readings from Last 3 Encounters:  09/25/19 175 lb 6.4 oz (79.6 kg)  10/24/18 168 lb 12.8 oz (76.6 kg)  05/26/17 169 lb (76.7 kg)    Physical Exam Constitutional:      General: She is not in acute distress.    Appearance: She is well-developed.  Cardiovascular:     Rate and Rhythm: Normal rate and regular rhythm.     Heart sounds: Normal heart sounds. No murmur. No friction rub.  Pulmonary:     Effort: Pulmonary effort is normal. No respiratory distress.     Breath sounds: Normal breath sounds. No wheezing or rales.  Abdominal:     General: Abdomen is flat. Bowel sounds are normal. There is no distension.     Palpations: Abdomen is soft. There is no mass.     Tenderness: There is no abdominal tenderness. There is no guarding.  Musculoskeletal:     Right lower leg: No edema.     Left lower leg: No edema.  Neurological:     Mental Status: She is alert and oriented to person, place, and time.  Psychiatric:        Behavior: Behavior normal.     Assessment/Plan  1. Allergic rhinitis, unspecified seasonality, unspecified trigger Well controlled.  2. Lumbar disc disease with radiculopathy Stable. Doing well overall.   3. Basal cell carcinoma (BCC) of skin of other part of face Following with dermatology regularly.  4. Insomnia, unspecified type otc sleep aids better than trazodone. Still does  best with ambien.  5. Mixed hyperlipidemia - Lipid panel; Future - TSH; Future  6. Other fatigue - CBC with Differential/Platelet; Future - Comprehensive metabolic panel; Future  7. Hyperglycemia - Hemoglobin A1c; Future  8. Need for shingles vaccine - Zoster Vaccine Adjuvanted [facility]) injection; Inject 0.5 mLs into the muscle once for 1 dose. Repeat in 2-6 months  Dispense: 0.5 mL; Refill: 0    Return in about 6 months (around 03/24/2020) for Chronic condition visit.    Micheline Rough, MD

## 2019-10-16 DIAGNOSIS — H5213 Myopia, bilateral: Secondary | ICD-10-CM | POA: Diagnosis not present

## 2019-10-16 DIAGNOSIS — H52201 Unspecified astigmatism, right eye: Secondary | ICD-10-CM | POA: Diagnosis not present

## 2019-10-16 DIAGNOSIS — H524 Presbyopia: Secondary | ICD-10-CM | POA: Diagnosis not present

## 2019-10-16 DIAGNOSIS — H2513 Age-related nuclear cataract, bilateral: Secondary | ICD-10-CM | POA: Diagnosis not present

## 2019-11-07 ENCOUNTER — Telehealth: Payer: Self-pay | Admitting: *Deleted

## 2019-11-07 NOTE — Telephone Encounter (Signed)
-----   Message from Caren Macadam, MD sent at 11/01/2019  4:49 PM EST ----- Patient stated she had DEXA with Dr. Sherren Mocha. I have searched old notes and cannot fine it or documentation of it. We should have imaging results unless over 10 years ago, which likely means she is due again. Only other possibility I see is if this was ordered by gyn instead and we didn't get copy? Can you see if you can find DEXA anywhere? If not, let her know I would recommend repeat for new baseline. And please save this message to telephone note for record sake. Thanks!

## 2019-11-07 NOTE — Telephone Encounter (Signed)
I called [facility] and spoke with Suanne Marker and she stated there is no record of a bone density for this pt.  Also called [facility] and spoke with Maudie Mercury and she stated there is no record of a bone density there either.  Left a message for the pt to return my call.  CRM also created.

## 2019-11-08 ENCOUNTER — Telehealth: Payer: Self-pay | Admitting: *Deleted

## 2019-11-08 NOTE — Telephone Encounter (Signed)
Please see prior phone note regarding bone density.

## 2019-11-08 NOTE — Telephone Encounter (Signed)
Patient called office and stated that she did not have dexa scan done at [facility]. She has scheduled her mammogram and would like for the referral for dexa scan to placed, so that she can schedule it.

## 2019-11-13 ENCOUNTER — Other Ambulatory Visit: Payer: Self-pay | Admitting: Family Medicine

## 2019-11-13 DIAGNOSIS — E2839 Other primary ovarian failure: Secondary | ICD-10-CM

## 2019-11-13 NOTE — Addendum Note (Signed)
Addended by: Caren Macadam on: 11/13/2019 11:17 AM   Modules accepted: Orders

## 2019-11-13 NOTE — Telephone Encounter (Signed)
I placed order, but not sure how we send to [facility]. Sending you message back as I wonder if we have to fax this order?

## 2019-11-14 NOTE — Telephone Encounter (Signed)
Pt called in, pt says that she spoke with [facility], they have not received orders for bone density, please resend.    Pt would like to be notified of when so that she can call them back for scheduling.

## 2019-11-14 NOTE — Telephone Encounter (Signed)
Order completed for the bone density and faxed to [facility] at [phone].  I called the pt, informed her of this and advised she call [facility] for an appt.

## 2019-11-14 NOTE — Telephone Encounter (Signed)
I called the pt and informed her I have the fax confirmation in which the fax was sent to [phone] and received at 10:30 am. Patient stated she will call [facility].

## 2019-11-20 DIAGNOSIS — Z85828 Personal history of other malignant neoplasm of skin: Secondary | ICD-10-CM | POA: Diagnosis not present

## 2019-11-20 DIAGNOSIS — D2272 Melanocytic nevi of left lower limb, including hip: Secondary | ICD-10-CM | POA: Diagnosis not present

## 2019-11-20 DIAGNOSIS — D2271 Melanocytic nevi of right lower limb, including hip: Secondary | ICD-10-CM | POA: Diagnosis not present

## 2019-11-20 DIAGNOSIS — L218 Other seborrheic dermatitis: Secondary | ICD-10-CM | POA: Diagnosis not present

## 2019-11-20 DIAGNOSIS — L918 Other hypertrophic disorders of the skin: Secondary | ICD-10-CM | POA: Diagnosis not present

## 2019-11-22 DIAGNOSIS — Z780 Asymptomatic menopausal state: Secondary | ICD-10-CM | POA: Diagnosis not present

## 2019-11-22 DIAGNOSIS — Z1231 Encounter for screening mammogram for malignant neoplasm of breast: Secondary | ICD-10-CM | POA: Diagnosis not present

## 2019-11-22 LAB — HM DEXA SCAN
HM Dexa Scan: NORMAL
HM Dexa Scan: NORMAL

## 2019-11-22 LAB — HM MAMMOGRAPHY

## 2019-12-01 ENCOUNTER — Encounter: Payer: Self-pay | Admitting: Family Medicine

## 2019-12-05 ENCOUNTER — Ambulatory Visit: Payer: Medicare Other

## 2019-12-13 ENCOUNTER — Encounter: Payer: Self-pay | Admitting: Family Medicine

## 2019-12-14 ENCOUNTER — Ambulatory Visit: Payer: Medicare Other | Attending: Internal Medicine

## 2019-12-14 DIAGNOSIS — Z23 Encounter for immunization: Secondary | ICD-10-CM | POA: Insufficient documentation

## 2019-12-14 NOTE — Progress Notes (Signed)
   Covid-19 Vaccination Clinic  Name:  Ellen Collins    MRN: [id number] DOB: [birth year]  12/14/2019  [name] was observed post Covid-19 immunization for 15 minutes without incidence. She was provided with Vaccine Information Sheet and instruction to access the V-Safe system.   [name] was instructed to call 911 with any severe reactions post vaccine: Marland Kitchen Difficulty breathing  . Swelling of your face and throat  . A fast heartbeat  . A bad rash all over your body  . Dizziness and weakness    Immunizations Administered    Name Date Dose VIS Date Route   Pfizer COVID-19 Vaccine 12/14/2019 12:04 PM 0.3 mL 10/20/2019 Intramuscular   Manufacturer: [place]   Lot: [id number]   [place]: [id number]

## 2019-12-26 ENCOUNTER — Ambulatory Visit: Payer: Medicare Other

## 2019-12-27 ENCOUNTER — Encounter: Payer: Self-pay | Admitting: Family Medicine

## 2020-01-08 ENCOUNTER — Ambulatory Visit: Payer: Medicare Other | Attending: Internal Medicine

## 2020-01-08 DIAGNOSIS — Z23 Encounter for immunization: Secondary | ICD-10-CM | POA: Insufficient documentation

## 2020-01-08 NOTE — Progress Notes (Signed)
   Covid-19 Vaccination Clinic  Name:  Ellen Collins    MRN: [id number] DOB: 02/13/1951  01/08/2020  Ms. Mcclenathan was observed post Covid-19 immunization for 15 minutes without incidence. She was provided with Vaccine Information Sheet and instruction to access the V-Safe system.   Ms. Chestnutt was instructed to call 911 with any severe reactions post vaccine: Marland Kitchen Difficulty breathing  . Swelling of your face and throat  . A fast heartbeat  . A bad rash all over your body  . Dizziness and weakness    Immunizations Administered    Name Date Dose VIS Date Route   Pfizer COVID-19 Vaccine 01/08/2020  3:17 PM 0.3 mL 10/20/2019 Intramuscular   Manufacturer: [place]   Lot: [id number]   [place]: [id number]

## 2020-01-24 ENCOUNTER — Other Ambulatory Visit: Payer: Self-pay | Admitting: Family Medicine

## 2020-01-24 DIAGNOSIS — G4700 Insomnia, unspecified: Secondary | ICD-10-CM

## 2020-03-25 ENCOUNTER — Ambulatory Visit: Payer: Medicare Other | Admitting: Family Medicine

## 2020-03-29 ENCOUNTER — Other Ambulatory Visit: Payer: Self-pay

## 2020-04-01 ENCOUNTER — Ambulatory Visit (INDEPENDENT_AMBULATORY_CARE_PROVIDER_SITE_OTHER): Payer: Medicare Other | Admitting: Family Medicine

## 2020-04-01 ENCOUNTER — Other Ambulatory Visit: Payer: Self-pay

## 2020-04-01 ENCOUNTER — Encounter: Payer: Self-pay | Admitting: Family Medicine

## 2020-04-01 VITALS — BP 142/76 | HR 102 | Temp 97.2°F | Ht 66.0 in | Wt 163.0 lb

## 2020-04-01 DIAGNOSIS — J302 Other seasonal allergic rhinitis: Secondary | ICD-10-CM | POA: Diagnosis not present

## 2020-04-01 DIAGNOSIS — H6123 Impacted cerumen, bilateral: Secondary | ICD-10-CM

## 2020-04-01 DIAGNOSIS — L309 Dermatitis, unspecified: Secondary | ICD-10-CM

## 2020-04-01 DIAGNOSIS — E2839 Other primary ovarian failure: Secondary | ICD-10-CM

## 2020-04-01 DIAGNOSIS — R739 Hyperglycemia, unspecified: Secondary | ICD-10-CM

## 2020-04-01 DIAGNOSIS — E782 Mixed hyperlipidemia: Secondary | ICD-10-CM

## 2020-04-01 DIAGNOSIS — Z1159 Encounter for screening for other viral diseases: Secondary | ICD-10-CM | POA: Diagnosis not present

## 2020-04-01 DIAGNOSIS — R030 Elevated blood-pressure reading, without diagnosis of hypertension: Secondary | ICD-10-CM

## 2020-04-01 LAB — CBC WITH DIFFERENTIAL/PLATELET
Basophils Absolute: 0 10*3/uL (ref 0.0–0.1)
Basophils Relative: 0.8 % (ref 0.0–3.0)
Eosinophils Absolute: 0.1 10*3/uL (ref 0.0–0.7)
Eosinophils Relative: 2.3 % (ref 0.0–5.0)
HCT: 39.9 % (ref 36.0–46.0)
Hemoglobin: 13.5 g/dL (ref 12.0–15.0)
Lymphocytes Relative: 35.5 % (ref 12.0–46.0)
Lymphs Abs: 1.8 10*3/uL (ref 0.7–4.0)
MCHC: 33.9 g/dL (ref 30.0–36.0)
MCV: 94.4 fl (ref 78.0–100.0)
Monocytes Absolute: 0.4 10*3/uL (ref 0.1–1.0)
Monocytes Relative: 8.9 % (ref 3.0–12.0)
Neutro Abs: 2.7 10*3/uL (ref 1.4–7.7)
Neutrophils Relative %: 52.5 % (ref 43.0–77.0)
Platelets: 297 10*3/uL (ref 150.0–400.0)
RBC: 4.22 Mil/uL (ref 3.87–5.11)
RDW: 13.2 % (ref 11.5–15.5)
WBC: 5.1 10*3/uL (ref 4.0–10.5)

## 2020-04-01 LAB — COMPREHENSIVE METABOLIC PANEL
ALT: 18 U/L (ref 0–35)
AST: 18 U/L (ref 0–37)
Albumin: 4.5 g/dL (ref 3.5–5.2)
Alkaline Phosphatase: 50 U/L (ref 39–117)
BUN: 12 mg/dL (ref 6–23)
CO2: 29 mEq/L (ref 19–32)
Calcium: 9.4 mg/dL (ref 8.4–10.5)
Chloride: 98 mEq/L (ref 96–112)
Creatinine, Ser: 0.83 mg/dL (ref 0.40–1.20)
GFR: 68.5 mL/min (ref >60.00)
Glucose, Bld: 115 mg/dL — ABNORMAL HIGH (ref 70–99)
Potassium: 4.3 mEq/L (ref 3.5–5.1)
Sodium: 136 mEq/L (ref 135–145)
Total Bilirubin: 0.6 mg/dL (ref 0.2–1.2)
Total Protein: 7.2 g/dL (ref 6.0–8.3)

## 2020-04-01 LAB — LIPID PANEL
Cholesterol: 228 mg/dL — ABNORMAL HIGH (ref 0–200)
HDL: 64.5 mg/dL (ref >39.00)
LDL Cholesterol: 124 mg/dL — ABNORMAL HIGH (ref 0–99)
NonHDL: 163.47
Total CHOL/HDL Ratio: 4
Triglycerides: 198 mg/dL — ABNORMAL HIGH (ref 0.0–149.0)
VLDL: 39.6 mg/dL (ref 0.0–40.0)

## 2020-04-01 LAB — HEMOGLOBIN A1C: Hgb A1c MFr Bld: 6.1 % (ref 4.6–6.5)

## 2020-04-01 MED ORDER — ZOLPIDEM TARTRATE 5 MG PO TABS: 5.0000 mg | ORAL_TABLET | Freq: Every evening | ORAL | 1 refills | Status: DC | PRN

## 2020-04-01 MED ORDER — BETAMETHASONE VALERATE 0.1 % EX LOTN: 1.0000 "application " | TOPICAL_LOTION | Freq: Two times a day (BID) | CUTANEOUS | 0 refills | Status: DC

## 2020-04-01 MED ORDER — METHYLPREDNISOLONE ACETATE 80 MG/ML IJ SUSP
80.0000 mg | Freq: Once | INTRAMUSCULAR | Status: AC
  Administered 2020-04-01: 80 mg via INTRAMUSCULAR

## 2020-04-01 NOTE — Progress Notes (Addendum)
Ellen Collins DOB: [birth year] Encounter date: 04/01/2020  This is a 69 y.o. female who presents with Chief Complaint  Patient presents with  . Follow-up    History of present illness:  Last visit was in November/2020 She states that she is going to get the shingles vaccine, she is just nervous.   Seasonal allergies: not been great this season. Left maxillary congestion, ear clicking. No fevers. Just gets same left sided congestion yearly. Even hears voice rattling with talking. Considering going to allergy. Doing claritin 5 mg, flonase.   Dysphagia: feels like she is doing better with swallowing, but her significant nasal drainage right now doesn't help.   Lumbar disc disease with radiculopathy: back has been really well lately. Last shot in 2018 and has really done well since then. Continues with turmeric. She is stretching regularly.   Anxiety: has been a tough year, but feels she is doing well overall. Takes xanax just once a month about. Rarely uses otherwise. Feels better just having them.  Just spoke with friend on the phone who is heading for chemo treatment.  This is been very stressful for her as this is a very close friend and she has advanced lung cancer.  Hyperlipidemia: We discussed working on eating healthier/losing weight last time  Follows with OB/GYN.-Did have hysterectomy due to mother's history of ovarian cancer. (Dr. Melba Coon). Hasn't been for exam this year. Tried taking just half of the estradiol but got very irritable. Feels like it balances her out. Helps with her hair loss she has had.   Insomnia: Has done best with a half of an Ambien. Trazodone didn't work for her.   Follows with Dr. Martinique for skin checks.  Had DEXA completed in January/2021.  Allergies  Allergen Reactions  . Latex     Itching, and redness   Current Meds  Medication Sig  . ALPRAZolam (XANAX) 0.5 MG tablet Take 0.5-1 tablets (0.25-0.5 mg total) by mouth as needed for anxiety.  .  betamethasone valerate lotion (VALISONE) 0.1 % Apply 1 application topically 2 (two) times daily.  . cholecalciferol (VITAMIN D) 1000 UNITS tablet Take 2,000 Units by mouth daily.   Marland Kitchen estradiol (ESTRACE) 1 MG tablet Take 1 mg by mouth daily.  . famotidine (PEPCID) 20 MG tablet Take 1 tablet (20 mg total) by mouth daily.  . fluticasone (FLONASE) 50 MCG/ACT nasal spray Place 1 spray into both nostrils daily.  Marland Kitchen loratadine (CLARITIN) 10 MG tablet Take 5 mg by mouth as needed. allergies   . Multiple Vitamins-Minerals (MULTIVITAMIN WITH MINERALS) tablet Take 1 tablet by mouth daily.  . NON FORMULARY Viviscal hair vitamin-Take one daily  . Omega-3 Fatty Acids (FISH OIL) 1000 MG CAPS Take 1 capsule by mouth daily. Take 350 mg daily  . Turmeric 500 MG TABS Take 500 mg by mouth daily.  . [DISCONTINUED] betamethasone valerate lotion (VALISONE) 0.1 % Apply 1 application topically 2 (two) times daily.  . [DISCONTINUED] traZODone (DESYREL) 50 MG tablet TAKE 1 TABLET BY MOUTH EVERYDAY AT BEDTIME   Current Facility-Administered Medications for the 04/01/20 encounter (Office Visit) with Caren Macadam, MD  Medication  . 0.9 %  sodium chloride infusion    Review of Systems  Constitutional: Negative for chills, fatigue and fever.  HENT: Positive for congestion, sinus pressure and sinus pain.   Respiratory: Negative for cough, chest tightness, shortness of breath and wheezing.   Cardiovascular: Negative for chest pain, palpitations and leg swelling.    Objective:  BP Marland Kitchen)  142/76 (BP Location: Left Arm, Patient Position: Sitting, Cuff Size: Normal)   Pulse (!) 102   Temp (!) 97.2 F (36.2 C) (Temporal)   Ht 5\' 6"  (1.676 m)   Wt 163 lb (73.9 kg)   BMI 26.31 kg/m   Weight: 163 lb (73.9 kg)   BP Readings from Last 3 Encounters:  04/01/20 (!) 142/76  09/25/19 122/78  10/24/18 140/70   Wt Readings from Last 3 Encounters:  04/01/20 163 lb (73.9 kg)  09/25/19 175 lb 6.4 oz (79.6 kg)  10/24/18 168  lb 12.8 oz (76.6 kg)    Physical Exam Constitutional:      General: She is not in acute distress.    Appearance: She is well-developed.  HENT:     Head:     Comments: Canals are dry; cerumen removed with currette    Right Ear: External ear normal. There is impacted cerumen.     Left Ear: External ear normal. There is impacted cerumen.     Ears:     Comments: There is some inflammation/irritation in the ear canals. Cardiovascular:     Rate and Rhythm: Normal rate and regular rhythm.     Heart sounds: Normal heart sounds. No murmur. No friction rub.  Pulmonary:     Effort: Pulmonary effort is normal. No respiratory distress.     Breath sounds: Normal breath sounds. No wheezing or rales.  Musculoskeletal:     Right lower leg: No edema.     Left lower leg: No edema.  Neurological:     Mental Status: She is alert and oriented to person, place, and time.  Psychiatric:        Behavior: Behavior normal.     Assessment/Plan  1. Seasonal allergies She is taking nasal steroid as well as antihistamine.  I have encouraged her to continue using this.  We will see if steroid injection in the office today helps to open up sinuses somewhat.  Has had success with this before. - methylPREDNISolone acetate (DEPO-MEDROL) injection 80 mg - CBC with Differential/Platelet; Future  2. Dermatitis of external ear Cerumen impaction was resolved in the office today, which immediately gave some relief of ear discomfort.  Suggested earwax softening drops, okay to continue with betamethasone lotion as needed.  If is not working for her, we did discuss using a steroid eardrop. - betamethasone valerate lotion (VALISONE) 0.1 %; Apply 1 application topically 2 (two) times daily.  Dispense: 60 mL; Refill: 0  3. Mixed hyperlipidemia Continue with fish oil, healthy eating, regular exercise - Comprehensive metabolic panel; Future - Lipid panel; Future  4. Estrogen deficiency She does well with the estradiol.   She has tried to wean off and has not been successful with this in the past.  It helps her in multiple ways including hot flashes and hair.  5. Hyperglycemia - Hemoglobin A1c; Future  6. Encounter for hepatitis C screening test for low risk patient - Hepatitis C antibody; Future  7. Elevated blood pressure: She had a stressful phone call in the way and is very upset about her friend who is going through chemotherapy.  She is tearful in the office about this.  We recheck blood pressure 2 more times and it was still elevated.  She is going to check at home and report back to me.    Return in about 6 months (around 10/02/2020) for physical exam.     Micheline Rough, MD

## 2020-04-02 ENCOUNTER — Telehealth: Payer: Self-pay | Admitting: *Deleted

## 2020-04-02 DIAGNOSIS — L309 Dermatitis, unspecified: Secondary | ICD-10-CM

## 2020-04-02 LAB — HEPATITIS C ANTIBODY
Hepatitis C Ab: NONREACTIVE
SIGNAL TO CUT-OFF: 0.01 (ref <1.00)

## 2020-04-02 MED ORDER — BETAMETHASONE VALERATE 0.1 % EX LOTN: 1.0000 "application " | TOPICAL_LOTION | Freq: Two times a day (BID) | CUTANEOUS | 0 refills | Status: DC

## 2020-04-02 NOTE — Telephone Encounter (Signed)
[facility] sent [facility] fax stating the Rx for Betamethasone va lotion 0.1% is not available through [facility] and asked if the provider would like to change the medication or send to [facility]? Message sent to PCP.

## 2020-04-02 NOTE — Telephone Encounter (Signed)
Spoke with the pt and she stated the Rx is for her ears and she prefers to have this filled at [facility].  Rx sent to [facility] per pts preference.

## 2020-04-02 NOTE — Telephone Encounter (Signed)
This is historically what she has used for her ears. Please ask her. I don't think other options would work as well as she needs the thin lotion to be able to apply and I feel this will leave less residue. I would be inclined to change to diff pharmacy, but if she doesn't want to do this let me know.

## 2020-04-04 ENCOUNTER — Encounter: Payer: Self-pay | Admitting: Family Medicine

## 2020-04-05 ENCOUNTER — Other Ambulatory Visit: Payer: Self-pay | Admitting: Family Medicine

## 2020-04-05 MED ORDER — AMOXICILLIN-POT CLAVULANATE 875-125 MG PO TABS
1.0000 | ORAL_TABLET | Freq: Two times a day (BID) | ORAL | 0 refills | Status: DC
Start: 2020-04-05 — End: 2020-06-24

## 2020-04-05 MED ORDER — PREDNISONE 20 MG PO TABS
40.0000 mg | ORAL_TABLET | Freq: Every day | ORAL | 0 refills | Status: DC
Start: 2020-04-05 — End: 2020-06-17

## 2020-04-10 ENCOUNTER — Encounter: Payer: Self-pay | Admitting: Family Medicine

## 2020-04-10 ENCOUNTER — Other Ambulatory Visit: Payer: Self-pay | Admitting: Family Medicine

## 2020-04-10 MED ORDER — ZOLPIDEM TARTRATE 5 MG PO TABS: 5.0000 mg | ORAL_TABLET | Freq: Every evening | ORAL | 3 refills | Status: DC | PRN

## 2020-05-03 ENCOUNTER — Encounter: Payer: Self-pay | Admitting: Family Medicine

## 2020-05-04 NOTE — Telephone Encounter (Signed)
Needs in-office eval

## 2020-06-17 ENCOUNTER — Other Ambulatory Visit: Payer: Self-pay | Admitting: Family Medicine

## 2020-06-17 ENCOUNTER — Encounter: Payer: Self-pay | Admitting: Family Medicine

## 2020-06-17 MED ORDER — PREDNISONE 20 MG PO TABS: 40.0000 mg | ORAL_TABLET | Freq: Every day | ORAL | 0 refills | Status: AC

## 2020-06-24 ENCOUNTER — Ambulatory Visit (INDEPENDENT_AMBULATORY_CARE_PROVIDER_SITE_OTHER): Payer: Medicare Other | Admitting: Family Medicine

## 2020-06-24 ENCOUNTER — Other Ambulatory Visit: Payer: Self-pay

## 2020-06-24 ENCOUNTER — Encounter: Payer: Self-pay | Admitting: Family Medicine

## 2020-06-24 VITALS — BP 130/78 | HR 100 | Temp 98.2°F | Ht 66.0 in | Wt 165.9 lb

## 2020-06-24 DIAGNOSIS — H9202 Otalgia, left ear: Secondary | ICD-10-CM | POA: Diagnosis not present

## 2020-06-24 MED ORDER — PREDNISONE 20 MG PO TABS: ORAL_TABLET | ORAL | 0 refills | Status: DC

## 2020-06-24 MED ORDER — CIPROFLOXACIN-DEXAMETHASONE 0.3-0.1 % OT SUSP: 4.0000 [drp] | Freq: Two times a day (BID) | OTIC | 0 refills | Status: DC

## 2020-06-24 NOTE — Progress Notes (Signed)
Ellen Collins DOB: 06-16-1951 Encounter date: 06/24/2020  This is a 69 y.o. female who presents with Chief Complaint  Patient presents with  . Ear Pain    History of present illness: Pressure left ear. Not constant pain. Seems worse at night; when lays down - starts ringing. Not sure why/what causes ringing. Just feels like it isn't opening up. Just not clearing/ opening up.   Last dose of prednisone was Saturday; started to close up again on Sunday once out.   She has tried is many things that she can think of for the ear.  She has changed diet, she has seen allergist in the past and had allergy shots, she saw ENT last year (they thought issues were more jaw-related/bite related), she got invisible line to help correct her bite, she uses Flonase and allergy medications on a daily basis.   Allergies  Allergen Reactions  . Latex     Itching, and redness   Current Meds  Medication Sig  . ALPRAZolam (XANAX) 0.5 MG tablet Take 0.5-1 tablets (0.25-0.5 mg total) by mouth as needed for anxiety.  . betamethasone valerate lotion (VALISONE) 0.1 % Apply 1 application topically 2 (two) times daily.  . cholecalciferol (VITAMIN D) 1000 UNITS tablet Take 2,000 Units by mouth daily.   Marland Kitchen estradiol (ESTRACE) 1 MG tablet Take 1 mg by mouth daily.  . famotidine (PEPCID) 20 MG tablet Take 1 tablet (20 mg total) by mouth daily.  . fluticasone (FLONASE) 50 MCG/ACT nasal spray Place 1 spray into both nostrils daily.  Marland Kitchen loratadine (CLARITIN) 10 MG tablet Take 5 mg by mouth as needed. allergies   . Multiple Vitamins-Minerals (MULTIVITAMIN WITH MINERALS) tablet Take 1 tablet by mouth daily.  . NON FORMULARY Viviscal hair vitamin-Take one daily  . Omega-3 Fatty Acids (FISH OIL) 1000 MG CAPS Take 1 capsule by mouth daily. Take 350 mg daily  . Turmeric 500 MG TABS Take 500 mg by mouth daily.  Marland Kitchen zolpidem (AMBIEN) 5 MG tablet Take 1 tablet (5 mg total) by mouth at bedtime as needed for sleep.  . [DISCONTINUED]  amoxicillin-clavulanate (AUGMENTIN) 875-125 MG tablet Take 1 tablet by mouth 2 (two) times daily.   Current Facility-Administered Medications for the 06/24/20 encounter (Office Visit) with Caren Macadam, MD  Medication  . 0.9 %  sodium chloride infusion    Review of Systems  Constitutional: Negative for chills, fatigue and fever.  HENT: Positive for congestion, hearing loss (sometimes diminished) and sinus pressure. Negative for ear discharge, sore throat and trouble swallowing.   Respiratory: Negative for cough, chest tightness, shortness of breath and wheezing.   Cardiovascular: Negative for chest pain, palpitations and leg swelling.    Objective:  BP 130/78 (BP Location: Left Arm, Patient Position: Sitting, Cuff Size: Normal)   Pulse 100   Temp 98.2 F (36.8 C) (Oral)   Ht 5\' 6"  (1.676 m)   Wt 165 lb 14.4 oz (75.3 kg)   BMI 26.78 kg/m   Weight: 165 lb 14.4 oz (75.3 kg)   BP Readings from Last 3 Encounters:  06/24/20 130/78  04/01/20 (!) 142/76  09/25/19 122/78   Wt Readings from Last 3 Encounters:  06/24/20 165 lb 14.4 oz (75.3 kg)  04/01/20 163 lb (73.9 kg)  09/25/19 175 lb 6.4 oz (79.6 kg)    Physical Exam Constitutional:      General: She is not in acute distress.    Appearance: She is well-developed.  HENT:     Right Ear:  Tympanic membrane, ear canal and external ear normal.     Left Ear: External ear normal.     Ears:     Comments: Left TM is dull, with dry scaling posteriorly.  Ear canal slightly erythematous. Cardiovascular:     Rate and Rhythm: Normal rate and regular rhythm.     Heart sounds: Normal heart sounds. No murmur heard.  No friction rub.  Pulmonary:     Effort: Pulmonary effort is normal. No respiratory distress.     Breath sounds: Normal breath sounds. No wheezing or rales.  Musculoskeletal:     Right lower leg: No edema.     Left lower leg: No edema.  Neurological:     Mental Status: She is alert and oriented to person, place, and  time.  Psychiatric:        Behavior: Behavior normal.     Assessment/Plan 1. Ear discomfort, left I do think that there is a component of eustachian tube dysfunction on the left side.  She did get some improvement with steroids, and strongly prefers to try these again rather than going to see specialist/other intervention.  We are going to try a slightly longer steroid taper.  Continue current allergy medications.  Additionally, uncertain what scaling on the TM is, but since she does have some itching within the ear, we will try some Ciprodex drops to see if this helps.  Referral was placed for ENT today and patient encouraged to see them if not getting relief of symptoms with above treatment. - ciprofloxacin-dexamethasone (CIPRODEX) OTIC suspension; Place 4 drops into the left ear 2 (two) times daily.  Dispense: 7.5 mL; Refill: 0 - Ambulatory referral to ENT - predniSONE (DELTASONE) 20 MG tablet; 3 tabs daily x 2 days, 2 tabs daily x 4 days, 1 tab daily x 3 days, 1/2 tab daily x 2 days  Dispense: 18 tablet; Refill: 0    Return if symptoms worsen or fail to improve.     Micheline Rough, MD

## 2020-06-25 DIAGNOSIS — H903 Sensorineural hearing loss, bilateral: Secondary | ICD-10-CM | POA: Diagnosis not present

## 2020-06-25 DIAGNOSIS — H9312 Tinnitus, left ear: Secondary | ICD-10-CM | POA: Diagnosis not present

## 2020-06-25 DIAGNOSIS — H6983 Other specified disorders of Eustachian tube, bilateral: Secondary | ICD-10-CM | POA: Diagnosis not present

## 2020-07-08 ENCOUNTER — Encounter: Payer: Self-pay | Admitting: Family Medicine

## 2020-07-08 ENCOUNTER — Other Ambulatory Visit: Payer: Self-pay | Admitting: Family Medicine

## 2020-07-08 MED ORDER — ESTRADIOL 1 MG PO TABS: 1.0000 mg | ORAL_TABLET | Freq: Every day | ORAL | 3 refills | Status: DC

## 2020-07-24 ENCOUNTER — Other Ambulatory Visit: Payer: Self-pay | Admitting: Family Medicine

## 2020-07-24 DIAGNOSIS — G4700 Insomnia, unspecified: Secondary | ICD-10-CM

## 2020-08-06 DIAGNOSIS — H903 Sensorineural hearing loss, bilateral: Secondary | ICD-10-CM | POA: Diagnosis not present

## 2020-08-06 DIAGNOSIS — H6983 Other specified disorders of Eustachian tube, bilateral: Secondary | ICD-10-CM | POA: Diagnosis not present

## 2020-08-28 ENCOUNTER — Encounter: Payer: Self-pay | Admitting: Family Medicine

## 2020-08-28 DIAGNOSIS — H9202 Otalgia, left ear: Secondary | ICD-10-CM

## 2020-08-29 MED ORDER — PREDNISONE 20 MG PO TABS: ORAL_TABLET | ORAL | 0 refills | Status: DC

## 2020-09-03 ENCOUNTER — Encounter: Payer: Self-pay | Admitting: Family Medicine

## 2020-09-03 ENCOUNTER — Telehealth (INDEPENDENT_AMBULATORY_CARE_PROVIDER_SITE_OTHER): Payer: Medicare Other | Admitting: Family Medicine

## 2020-09-03 DIAGNOSIS — R300 Dysuria: Secondary | ICD-10-CM | POA: Diagnosis not present

## 2020-09-03 MED ORDER — NITROFURANTOIN MONOHYD MACRO 100 MG PO CAPS
100.0000 mg | ORAL_CAPSULE | Freq: Two times a day (BID) | ORAL | 0 refills | Status: DC
Start: 2020-09-03 — End: 2020-09-30

## 2020-09-03 NOTE — Patient Instructions (Signed)
-  I sent the medication(s) we discussed to your pharmacy: Meds ordered this encounter  Medications   nitrofurantoin, macrocrystal-monohydrate, (MACROBID) 100 MG capsule    Sig: Take 1 capsule (100 mg total) by mouth 2 (two) times daily.    Dispense:  14 capsule    Refill:  0     I hope you are feeling better soon!  Seek in person care promptly if your symptoms worsen, new concerns arise or you are not improving with treatment.  It was nice to meet you today. I help [facility] out with telemedicine visits on Tuesdays and Thursdays and am available for visits on those days. If you have any concerns or questions following this visit please schedule a follow up visit with your Primary Care doctor or seek care at a local urgent care clinic to avoid delays in care.   

## 2020-09-03 NOTE — Progress Notes (Signed)
Virtual Visit via Telephone Note  I connected with Ellen Collins on 09/03/20 at 12:20 PM EDT by telephone and verified that I am speaking with the correct person using two identifiers.   I discussed the limitations, risks, security and privacy concerns of performing an evaluation and management service by telephone and the availability of in person appointments. I also discussed with the patient that there may be a patient responsible charge related to this service. The patient expressed understanding and agreed to proceed.  Location patient: home, [place] Location provider: work or home office Participants present for the call: patient, provider Patient did not have a visit in the prior 7 days to address this/these issue(s).   History of Present Illness:  Acute telemedicine visit for Dysuria: -Onset: 3-4 days ago -Symptoms include: urgency, frequency, dysuria - mild burning/pressure with urination -Denies: fevers, flank or abd pain, NVD, vaginal discharge -Has tried: Azo - this helped some -Pertinent past medical history: hx of UTI - this feels similar -Pertinent medication allergies: latex   Observations/Objective: Patient sounds cheerful and well on the phone. I do not appreciate any SOB. Speech and thought processing are grossly intact. Patient reported vitals:  Assessment and Plan:  Dysuria  -we discussed possible serious and likely etiologies, options for evaluation and workup, limitations of telemedicine visit vs in person visit, treatment, treatment risks and precautions. Pt prefers to treat via telemedicine empirically rather than in person at this moment.  She opted for empiric treatment for possible uncomplicated cystitis with Macrobid 100 mg twice daily for 7 days.   Advised to seek prompt in person care if worsening, new symptoms arise, or if is not improving with treatment over the next 1 to 2 days. Advised of options for inperson care in case PCP office not available. Did  let the patient know that I only do telemedicine shifts for [facility] on Tuesdays and Thursdays and advised a follow up visit with PCP or at an [facility] if has further questions or concerns.   Follow Up Instructions:  I did not refer this patient for an OV in the next 24 hours for this/these issue(s).  I discussed the assessment and treatment plan with the patient. The patient was provided an opportunity to ask questions and all were answered. The patient agreed with the plan and demonstrated an understanding of the instructions.   Spent 18 minutes on this encounter.   Lucretia Kern, DO

## 2020-09-10 DIAGNOSIS — H6983 Other specified disorders of Eustachian tube, bilateral: Secondary | ICD-10-CM | POA: Diagnosis not present

## 2020-09-10 DIAGNOSIS — H903 Sensorineural hearing loss, bilateral: Secondary | ICD-10-CM | POA: Diagnosis not present

## 2020-09-16 ENCOUNTER — Other Ambulatory Visit: Payer: Self-pay | Admitting: Otolaryngology

## 2020-09-16 DIAGNOSIS — H918X9 Other specified hearing loss, unspecified ear: Secondary | ICD-10-CM

## 2020-09-30 ENCOUNTER — Ambulatory Visit (INDEPENDENT_AMBULATORY_CARE_PROVIDER_SITE_OTHER): Payer: Medicare Other | Admitting: Family Medicine

## 2020-09-30 ENCOUNTER — Encounter: Payer: Self-pay | Admitting: Family Medicine

## 2020-09-30 ENCOUNTER — Other Ambulatory Visit: Payer: Self-pay

## 2020-09-30 VITALS — BP 132/82 | HR 91 | Temp 98.1°F | Ht 66.0 in | Wt 168.8 lb

## 2020-09-30 DIAGNOSIS — J309 Allergic rhinitis, unspecified: Secondary | ICD-10-CM

## 2020-09-30 DIAGNOSIS — R350 Frequency of micturition: Secondary | ICD-10-CM | POA: Diagnosis not present

## 2020-09-30 DIAGNOSIS — F411 Generalized anxiety disorder: Secondary | ICD-10-CM

## 2020-09-30 DIAGNOSIS — Z0000 Encounter for general adult medical examination without abnormal findings: Secondary | ICD-10-CM

## 2020-09-30 DIAGNOSIS — E782 Mixed hyperlipidemia: Secondary | ICD-10-CM | POA: Diagnosis not present

## 2020-09-30 DIAGNOSIS — G4700 Insomnia, unspecified: Secondary | ICD-10-CM | POA: Diagnosis not present

## 2020-09-30 LAB — POC URINALSYSI DIPSTICK (AUTOMATED)
Bilirubin, UA: NEGATIVE
Blood, UA: NEGATIVE
Glucose, UA: NEGATIVE
Ketones, UA: NEGATIVE
Leukocytes, UA: NEGATIVE
Nitrite, UA: NEGATIVE
Protein, UA: NEGATIVE
Spec Grav, UA: 1.01 (ref 1.010–1.025)
Urobilinogen, UA: 0.2 E.U./dL
pH, UA: 6 (ref 5.0–8.0)

## 2020-09-30 MED ORDER — ALPRAZOLAM 0.5 MG PO TABS: 0.2500 mg | ORAL_TABLET | ORAL | 0 refills | Status: DC | PRN

## 2020-09-30 MED ORDER — ESCITALOPRAM OXALATE 5 MG PO TABS: 5.0000 mg | ORAL_TABLET | Freq: Every day | ORAL | 1 refills | Status: DC

## 2020-09-30 NOTE — Progress Notes (Signed)
Ellen Collins DOB: 13-Apr-1951 Encounter date: 09/30/2020  This is a 69 y.o. female who presents for complete physical   History of present illness/Additional concerns: Had e-visit on 10/26 with urinary urgency - has to go every minute. Had taken AZO which helped while taking it, but sx returned once she stopped. Sx improved on macrobid but came back.  Just feels a little down. Always a little unsettled/anxious. Does sleep ok with the ambien but tries not to take nightly - doesn't sleep well on nights she doesn't take it.  Seasonal allergies: not been great this season. Left maxillary congestion, ear clicking. No fevers. Just gets same left sided congestion yearly. Even hears voice rattling with talking. Considering going to allergy. Doing claritin 5 mg, flonase.   Dysphagia: feels like she is doing better with swallowing, but her significant nasal drainage right now doesn't help.   Lumbar disc disease with radiculopathy: back has been really well lately. Last shot in 2018 and has really done well since then. Continues with turmeric. She is stretching regularly.   Anxiety: has been a tough year, but feels she is doing well overall. Takes xanax just once a month about. Rarely uses otherwise. Feels better just having them.  Just spoke with friend on the phone who is heading for chemo treatment.  This is been very stressful for her as this is a very close friend and she has advanced lung cancer.  Hyperlipidemia: We discussed working on eating healthier/losing weight last time  Follows with OB/GYN.-Did have hysterectomy due to mother's history of ovarian cancer. (Dr. Melba Coon). Hasn't been for exam this year. Tried taking just half of the estradiol but got very irritable. Feels like it balances her out. Helps with her hair loss she has had.   Insomnia: Has done best with a half of an Ambien. Trazodone didn't work for her.   Follows with Dr. Martinique for skin checks.  Had DEXA completed in  January/2021. Has had flu shot, both shingles vaccines, and COVID booster.   Past Medical History:  Diagnosis Date  . Allergy   . Anxiety   . GERD (gastroesophageal reflux disease)   . Low back pain radiating to both legs    worse of left side  . Paroxysmal SVT (supraventricular tachycardia) ([place])    she reports rare problems over years   Past Surgical History:  Procedure Laterality Date  . broken foot     right foot/ has pins  . COLONOSCOPY    . UPPER GASTROINTESTINAL ENDOSCOPY    . VAGINAL HYSTERECTOMY  1998   Allergies  Allergen Reactions  . Latex     Itching, and redness   Current Meds  Medication Sig  . ALPRAZolam (XANAX) 0.5 MG tablet Take 0.5-1 tablets (0.25-0.5 mg total) by mouth as needed for anxiety.  . betamethasone valerate lotion (VALISONE) 0.1 % Apply 1 application topically 2 (two) times daily.  . cholecalciferol (VITAMIN D) 1000 UNITS tablet Take 2,000 Units by mouth daily.   . ciprofloxacin-dexamethasone (CIPRODEX) OTIC suspension Place 4 drops into the left ear 2 (two) times daily.  Marland Kitchen estradiol (ESTRACE) 1 MG tablet Take 1 tablet (1 mg total) by mouth daily.  . famotidine (PEPCID) 20 MG tablet Take 1 tablet (20 mg total) by mouth daily.  . fluticasone (FLONASE) 50 MCG/ACT nasal spray Place 1 spray into both nostrils daily.  Marland Kitchen loratadine (CLARITIN) 10 MG tablet Take 5 mg by mouth as needed. allergies   . Multiple Vitamins-Minerals (MULTIVITAMIN WITH MINERALS)  tablet Take 1 tablet by mouth daily.  . NON FORMULARY Viviscal hair vitamin-Take one daily  . Omega-3 Fatty Acids (FISH OIL) 1000 MG CAPS Take 1 capsule by mouth daily. Take 350 mg daily  . traZODone (DESYREL) 50 MG tablet TAKE 1 TABLET BY MOUTH EVERYDAY AT BEDTIME  . Turmeric 500 MG TABS Take 500 mg by mouth daily.  Marland Kitchen zolpidem (AMBIEN) 5 MG tablet Take 1 tablet (5 mg total) by mouth at bedtime as needed for sleep.  . [DISCONTINUED] ALPRAZolam (XANAX) 0.5 MG tablet Take 0.5-1 tablets (0.25-0.5 mg total)  by mouth as needed for anxiety.  . [DISCONTINUED] nitrofurantoin, macrocrystal-monohydrate, (MACROBID) 100 MG capsule Take 1 capsule (100 mg total) by mouth 2 (two) times daily.   Current Facility-Administered Medications for the 09/30/20 encounter (Office Visit) with Caren Macadam, MD  Medication  . 0.9 %  sodium chloride infusion   Social History   Tobacco Use  . Smoking status: Never Smoker  . Smokeless tobacco: Never Used  Substance Use Topics  . Alcohol use: Yes    Alcohol/week: 1.0 standard drink    Types: 1 Glasses of wine per week    Comment: social   Family History  Problem Relation Age of Onset  . Colon cancer Father 3  . Ovarian cancer Mother      Review of Systems  Constitutional: Negative for activity change, appetite change, chills, fatigue, fever and unexpected weight change.  HENT: Negative for congestion, ear pain, hearing loss, sinus pressure, sinus pain, sore throat and trouble swallowing.   Eyes: Negative for pain and visual disturbance.  Respiratory: Negative for cough, chest tightness, shortness of breath and wheezing.   Cardiovascular: Negative for chest pain, palpitations and leg swelling.  Gastrointestinal: Negative for abdominal pain, blood in stool, constipation, diarrhea, nausea and vomiting.  Genitourinary: Positive for frequency and urgency. Negative for difficulty urinating and menstrual problem.  Musculoskeletal: Negative for arthralgias and back pain.  Skin: Negative for rash.  Neurological: Negative for dizziness, weakness, numbness and headaches.  Hematological: Negative for adenopathy. Does not bruise/bleed easily.  Psychiatric/Behavioral: Positive for sleep disturbance. Negative for suicidal ideas. The patient is nervous/anxious.     CBC:  Lab Results  Component Value Date   WBC 5.1 04/01/2020   HGB 13.5 04/01/2020   HCT 39.9 04/01/2020   MCH 31.8 05/26/2017   MCHC 33.9 04/01/2020   RDW 13.2 04/01/2020   PLT 297.0 04/01/2020    CMP: Lab Results  Component Value Date   NA 136 04/01/2020   K 4.3 04/01/2020   CL 98 04/01/2020   CO2 29 04/01/2020   ANIONGAP 12 05/26/2017   GLUCOSE 115 (H) 04/01/2020   BUN 12 04/01/2020   CREATININE 0.83 04/01/2020   GFRAA >60 05/26/2017   CALCIUM 9.4 04/01/2020   PROT 7.2 04/01/2020   BILITOT 0.6 04/01/2020   ALKPHOS 50 04/01/2020   ALT 18 04/01/2020   AST 18 04/01/2020   LIPID: Lab Results  Component Value Date   CHOL 228 (H) 04/01/2020   TRIG 198.0 (H) 04/01/2020   HDL 64.50 04/01/2020   LDLCALC 124 (H) 04/01/2020    Objective:  BP 132/82 (BP Location: Left Arm, Patient Position: Sitting, Cuff Size: Normal)   Pulse 91   Temp 98.1 F (36.7 C) (Oral)   Ht 5\' 6"  (1.676 m)   Wt 168 lb 12.8 oz (76.6 kg)   BMI 27.25 kg/m   Weight: 168 lb 12.8 oz (76.6 kg)   BP Readings from Last  3 Encounters:  09/30/20 132/82  06/24/20 130/78  04/01/20 (!) 142/76   Wt Readings from Last 3 Encounters:  09/30/20 168 lb 12.8 oz (76.6 kg)  06/24/20 165 lb 14.4 oz (75.3 kg)  04/01/20 163 lb (73.9 kg)    Physical Exam Constitutional:      General: She is not in acute distress.    Appearance: She is well-developed.  HENT:     Head: Normocephalic and atraumatic.     Right Ear: External ear normal.     Left Ear: External ear normal.     Mouth/Throat:     Pharynx: No oropharyngeal exudate.  Eyes:     Conjunctiva/sclera: Conjunctivae normal.     Pupils: Pupils are equal, round, and reactive to light.  Neck:     Thyroid: No thyromegaly.  Cardiovascular:     Rate and Rhythm: Normal rate and regular rhythm.     Heart sounds: Normal heart sounds. No murmur heard.  No friction rub. No gallop.   Pulmonary:     Effort: Pulmonary effort is normal.     Breath sounds: Normal breath sounds.  Abdominal:     General: Bowel sounds are normal. There is no distension.     Palpations: Abdomen is soft. There is no mass.     Tenderness: There is no abdominal tenderness. There is no  guarding.     Hernia: No hernia is present.  Musculoskeletal:        General: No tenderness or deformity. Normal range of motion.     Cervical back: Normal range of motion and neck supple.  Lymphadenopathy:     Cervical: No cervical adenopathy.  Skin:    General: Skin is warm and dry.     Findings: No rash.  Neurological:     Mental Status: She is alert and oriented to person, place, and time.     Deep Tendon Reflexes: Reflexes normal.     Reflex Scores:      Tricep reflexes are 2+ on the right side and 2+ on the left side.      Bicep reflexes are 2+ on the right side and 2+ on the left side.      Brachioradialis reflexes are 2+ on the right side and 2+ on the left side.      Patellar reflexes are 2+ on the right side and 2+ on the left side. Psychiatric:        Speech: Speech normal.        Behavior: Behavior normal.        Thought Content: Thought content normal.     Assessment/Plan: There are no preventive care reminders to display for this patient. Health Maintenance reviewed.  1. Preventative health care Keep up with healthy eating, active lifestyle. She is UTD with preventative health care.  2. Allergic rhinitis, unspecified seasonality, unspecified trigger She is noting some improvement with longer term use of steroid nasal spray. Goes this weekend for MRI ordered by ENT. Advised ok to take 2 xanax prior to imaging.  3. Mixed hyperlipidemia Has been diet controlled. Continue with fish oil. Will recheck at next visit.  4. Insomnia, unspecified type She does well alternating the Ambien and trazodone.  I am hopeful that with the Lexapro on board she may actually sleep better as this will help her with anxiety.  5. Urinary frequency UA today was negative.  She recently was treated with nitrofurantoin but continues to have urgency and frequency symptoms.  We will send for  urine culture to make sure there is no infection.  We discussed other causes of the symptoms and will  consider further evaluation and treatment pending urine culture results.  I have asked her to call if she feels the symptoms are worsening in the meanwhile.  Advised her to avoid spicy, citrus, caffeine as these can further aggravate the bladder. - POCT Urinalysis Dipstick (Automated) - Culture, Urine; Future  6. Anxiety state Refilled Xanax today.  She uses this very rarely.  We are going to start her on Lexapro, which I think will help with anxiety and sad mood overall.  I have asked her to update me in 2 weeks in my chart and we will have her to follow-up visit in 1 month. - ALPRAZolam (XANAX) 0.5 MG tablet; Take 0.5-1 tablets (0.25-0.5 mg total) by mouth as needed for anxiety.  Dispense: 30 tablet; Refill: 0 - escitalopram (LEXAPRO) 5 MG tablet; Take 1 tablet (5 mg total) by mouth daily.  Dispense: 90 tablet; Refill: 1  Return in about 1 month (around 10/30/2020) for Chronic condition visit - anxiety.  Micheline Rough, MD

## 2020-09-30 NOTE — Addendum Note (Signed)
Addended by: Alverda Skeans on: 09/30/2020 01:12 PM   Modules accepted: Orders

## 2020-09-30 NOTE — Patient Instructions (Signed)
Let me know how you are doing with the lexapro in a couple of weeks - just let me know how you are feeling/tolerating med. Let me know sooner if any concerns/problems with medication.

## 2020-10-02 ENCOUNTER — Encounter: Payer: Self-pay | Admitting: Family Medicine

## 2020-10-02 LAB — URINE CULTURE
MICRO NUMBER:: 11233100
SPECIMEN QUALITY:: ADEQUATE

## 2020-10-06 ENCOUNTER — Ambulatory Visit
Admission: RE | Admit: 2020-10-06 | Discharge: 2020-10-06 | Disposition: A | Discharge status: Home / Self Care | Payer: Medicare Other | Source: Ambulatory Visit | Attending: Otolaryngology | Admitting: Otolaryngology

## 2020-10-06 ENCOUNTER — Other Ambulatory Visit: Payer: Self-pay

## 2020-10-06 DIAGNOSIS — H9319 Tinnitus, unspecified ear: Secondary | ICD-10-CM | POA: Diagnosis not present

## 2020-10-06 DIAGNOSIS — H918X9 Other specified hearing loss, unspecified ear: Secondary | ICD-10-CM

## 2020-10-06 MED ORDER — GADOBENATE DIMEGLUMINE 529 MG/ML IV SOLN
15.0000 mL | Freq: Once | INTRAVENOUS | Status: AC | PRN
  Administered 2020-10-06: 15 mL via INTRAVENOUS

## 2020-10-08 ENCOUNTER — Other Ambulatory Visit: Payer: Self-pay | Admitting: Family Medicine

## 2020-10-08 DIAGNOSIS — J0110 Acute frontal sinusitis, unspecified: Secondary | ICD-10-CM

## 2020-10-08 MED ORDER — AMOXICILLIN-POT CLAVULANATE 875-125 MG PO TABS: 1.0000 | ORAL_TABLET | Freq: Two times a day (BID) | ORAL | 0 refills | Status: AC

## 2020-10-17 DIAGNOSIS — H2513 Age-related nuclear cataract, bilateral: Secondary | ICD-10-CM | POA: Diagnosis not present

## 2020-10-21 ENCOUNTER — Encounter: Payer: Self-pay | Admitting: Family Medicine

## 2020-11-27 DIAGNOSIS — Z1231 Encounter for screening mammogram for malignant neoplasm of breast: Secondary | ICD-10-CM | POA: Diagnosis not present

## 2020-11-27 LAB — HM MAMMOGRAPHY

## 2020-12-04 DIAGNOSIS — D2271 Melanocytic nevi of right lower limb, including hip: Secondary | ICD-10-CM | POA: Diagnosis not present

## 2020-12-04 DIAGNOSIS — L570 Actinic keratosis: Secondary | ICD-10-CM | POA: Diagnosis not present

## 2020-12-04 DIAGNOSIS — L821 Other seborrheic keratosis: Secondary | ICD-10-CM | POA: Diagnosis not present

## 2020-12-04 DIAGNOSIS — D2272 Melanocytic nevi of left lower limb, including hip: Secondary | ICD-10-CM | POA: Diagnosis not present

## 2020-12-04 DIAGNOSIS — Z85828 Personal history of other malignant neoplasm of skin: Secondary | ICD-10-CM | POA: Diagnosis not present

## 2020-12-04 DIAGNOSIS — L820 Inflamed seborrheic keratosis: Secondary | ICD-10-CM | POA: Diagnosis not present

## 2020-12-05 ENCOUNTER — Encounter: Payer: Self-pay | Admitting: Family Medicine

## 2020-12-26 ENCOUNTER — Ambulatory Visit: Payer: Medicare Other

## 2021-01-03 NOTE — Progress Notes (Signed)
Subjective:   Ellen Collins is a 70 y.o. female who presents for an Initial Medicare Annual Wellness Visit.  Virtual Visit via Video Note  I connected with Ellen Collins by a video enabled telemedicine application and verified that I am speaking with the correct person using two identifiers.  Location: Patient: Home Provider: Office Persons participating in the virtual visit: patient, provider   I discussed the limitations of evaluation and management by telemedicine and the availability of in person appointments. The patient expressed understanding and agreed to proceed.     Larene Beach Jameeka Marcy,LPN    Review of Systems    N/A  Cardiac Risk Factors include: advanced age (>45men, >32 women);dyslipidemia     Objective:    Today's Vitals   There is no height or weight on file to calculate BMI.  Advanced Directives 01/06/2021 05/26/2017  Does Patient Have a Medical Advance Directive? Yes No  Type of Paramedic of [place];Living will -  Does patient want to make changes to medical advance directive? No - Patient declined -  Copy of [place] in Chart? No - copy requested -  Would patient like information on creating a medical advance directive? - No - Patient declined    Current Medications (verified) Outpatient Encounter Medications as of 01/06/2021  Medication Sig  . ALPRAZolam (XANAX) 0.5 MG tablet Take 0.5-1 tablets (0.25-0.5 mg total) by mouth as needed for anxiety.  . betamethasone valerate lotion (VALISONE) 0.1 % Apply 1 application topically 2 (two) times daily.  . cholecalciferol (VITAMIN D) 1000 UNITS tablet Take 2,000 Units by mouth daily.   Marland Kitchen escitalopram (LEXAPRO) 5 MG tablet Take 1 tablet (5 mg total) by mouth daily.  Marland Kitchen estradiol (ESTRACE) 1 MG tablet Take 1 tablet (1 mg total) by mouth daily.  . famotidine (PEPCID) 20 MG tablet Take 1 tablet (20 mg total) by mouth daily.  . fluticasone (FLONASE) 50 MCG/ACT nasal spray  Place 1 spray into both nostrils daily.  Marland Kitchen loratadine (CLARITIN) 10 MG tablet Take 5 mg by mouth as needed. allergies  . Multiple Vitamins-Minerals (MULTIVITAMIN WITH MINERALS) tablet Take 1 tablet by mouth daily.  . NON FORMULARY Viviscal hair vitamin-Take one daily  . Omega-3 Fatty Acids (FISH OIL) 1000 MG CAPS Take 1 capsule by mouth daily. Take 350 mg daily  . Turmeric 500 MG TABS Take 500 mg by mouth daily.  Marland Kitchen zolpidem (AMBIEN) 5 MG tablet Take 1 tablet (5 mg total) by mouth at bedtime as needed for sleep.  . ciprofloxacin-dexamethasone (CIPRODEX) OTIC suspension Place 4 drops into the left ear 2 (two) times daily. (Patient not taking: Reported on 01/06/2021)  . traZODone (DESYREL) 50 MG tablet TAKE 1 TABLET BY MOUTH EVERYDAY AT BEDTIME (Patient not taking: Reported on 01/06/2021)   Facility-Administered Encounter Medications as of 01/06/2021  Medication  . 0.9 %  sodium chloride infusion    Allergies (verified) Latex   History: Past Medical History:  Diagnosis Date  . Allergy   . Anxiety   . GERD (gastroesophageal reflux disease)   . Low back pain radiating to both legs    worse of left side  . Paroxysmal SVT (supraventricular tachycardia) ([place])    she reports rare problems over years   Past Surgical History:  Procedure Laterality Date  . broken foot     right foot/ has pins  . COLONOSCOPY    . UPPER GASTROINTESTINAL ENDOSCOPY    . [place]   Family  History  Problem Relation Age of Onset  . Colon cancer Father 74  . Ovarian cancer Mother    Social History   Socioeconomic History  . Marital status: Married    Spouse name: Not on file  . Number of children: Not on file  . Years of education: Not on file  . Highest education level: Not on file  Occupational History  . Not on file  Tobacco Use  . Smoking status: Never Smoker  . Smokeless tobacco: Never Used  Substance and Sexual Activity  . Alcohol use: Yes    Alcohol/week: 1.0 standard drink     Types: 1 Glasses of wine per week    Comment: social  . Drug use: No  . Sexual activity: Not on file  Other Topics Concern  . Not on file  Social History Narrative  . Not on file   Social Determinants of Health   Financial Resource Strain: Low Risk   . Difficulty of Paying Living Expenses: Not hard at all  Food Insecurity: No Food Insecurity  . Worried About Charity fundraiser in the Last Year: Never true  . Ran Out of Food in the Last Year: Never true  Transportation Needs: No Transportation Needs  . Lack of Transportation (Medical): No  . Lack of Transportation (Non-Medical): No  Physical Activity: Inactive  . Days of Exercise per Week: 0 days  . Minutes of Exercise per Session: 0 min  Stress: No Stress Concern Present  . Feeling of Stress : Not at all  Social Connections: Socially Integrated  . Frequency of Communication with Friends and Family: More than three times a week  . Frequency of Social Gatherings with Friends and Family: Twice a week  . Attends Religious Services: 1 to 4 times per year  . Active Member of Clubs or Organizations: Yes  . Attends Archivist Meetings: 1 to 4 times per year  . Marital Status: Married    Tobacco Counseling Counseling given: Not Answered   Clinical Intake:  Pre-visit preparation completed: Yes  Pain : No/denies pain     Nutritional Risks: None Diabetes: No  How often do you need to have someone help you when you read instructions, pamphlets, or other written materials from your doctor or pharmacy?: 1 - Never  Diabetic?No      Information entered by :: [place] of Daily Living In your present state of health, do you have any difficulty performing the following activities: 01/06/2021  Hearing? N  Vision? N  Difficulty concentrating or making decisions? N  Walking or climbing stairs? N  Dressing or bathing? N  Doing errands, shopping? N  Preparing Food and eating ? N  Using the Toilet?  N  In the past six months, have you accidently leaked urine? Y  Do you have problems with loss of bowel control? N  Managing your Medications? N  Managing your Finances? N  Housekeeping or managing your Housekeeping? N  Some recent data might be hidden    Patient Care Team: Caren Macadam, MD as PCP - General (Family Medicine) Martinique, Amy, MD as Consulting Physician (Dermatology) Janyth Contes, MD as Consulting Physician (Obstetrics and Gynecology)  Indicate any recent Medical Services you may have received from other than [facility] providers in the past year (date may be approximate).     Assessment:   This is a routine wellness examination for Ellen Collins.  Hearing/Vision screen  Hearing Screening   125Hz  250Hz  500Hz  1000Hz   2000Hz  3000Hz  4000Hz  6000Hz  8000Hz   Right ear:           Left ear:           Vision Screening Comments: States gets eyes examined once per year. Wears contact and glasses   Dietary issues and exercise activities discussed: Current Exercise Habits: The patient does not participate in regular exercise at present  Goals    . Exercise 150 min/wk Moderate Activity    . Patient Stated     I would like to clean my closet and get my back yard cleaned up      Depression Screen PHQ 2/9 Scores 01/06/2021 09/30/2020  PHQ - 2 Score 0 0    Fall Risk Fall Risk  01/06/2021 09/30/2020  Falls in the past year? 0 0  Number falls in past yr: 0 0  Injury with Fall? 0 0  Risk for fall due to : No Fall Risks -  Follow up Falls evaluation completed;Falls prevention discussed -    FALL RISK PREVENTION PERTAINING TO THE HOME:  Any stairs in or around the home? Yes  If so, are there any without handrails? No  Home free of loose throw rugs in walkways, pet beds, electrical cords, etc? Yes  Adequate lighting in your home to reduce risk of falls? Yes   ASSISTIVE DEVICES UTILIZED TO PREVENT FALLS:  Life alert? No Use of a cane, walker or w/c? No  Grab bars in the  bathroom? No  Shower chair or bench in shower? Yes  Elevated toilet seat or a handicapped toilet? No     Cognitive Function:   Normal cognitive status assessed by direct observation by this Nurse Health Advisor. No abnormalities found.        Immunizations Immunization History  Administered Date(s) Administered  . Influenza Split 08/02/2012  . Influenza, High Dose Seasonal PF 07/29/2018  . Influenza,inj,Quad PF,6+ Mos 07/20/2013, 08/13/2014, 08/16/2015, 08/12/2016, 08/10/2017  . Influenza-Unspecified 07/08/2019, 07/19/2020  . PFIZER(Purple Top)SARS-COV-2 Vaccination 12/14/2019, 01/08/2020, 08/05/2020  . Pneumococcal Polysaccharide-23 07/08/2019  . Td 11/09/2005  . Tdap 07/17/2019  . Zoster 04/17/2013  . Zoster Recombinat (Shingrix) 04/29/2020, 07/19/2020    TDAP status: Up to date  Flu Vaccine status: Up to date  Pneumococcal vaccine status: Due, Education has been provided regarding the importance of this vaccine. Advised may receive this vaccine at local pharmacy or Health Dept. Aware to provide a copy of the vaccination record if obtained from local pharmacy or Health Dept. Verbalized acceptance and understanding.  Covid-19 vaccine status: Completed vaccines  Qualifies for Shingles Vaccine? Yes   Zostavax completed Yes   Shingrix Completed?: Yes  Screening Tests Health Maintenance  Topic Date Due  . COVID-19 Vaccine (4 - Booster for Pfizer series) 02/02/2021  . MAMMOGRAM  11/27/2021  . COLONOSCOPY (Pts 45-41yrs Insurance coverage will need to be confirmed)  02/09/2022  . TETANUS/TDAP  07/16/2029  . INFLUENZA VACCINE  Completed  . DEXA SCAN  Completed  . Hepatitis C Screening  Completed  . PNA vac Low Risk Adult  Discontinued    Health Maintenance  There are no preventive care reminders to display for this patient.  Colorectal cancer screening: Type of screening: Colonoscopy. Completed 02/09/2017. Repeat every 5 years  Mammogram status: Completed  11/27/2020. Repeat every year  Bone Density status: Completed 11/22/2019. Results reflect: Bone density results: NORMAL. Repeat every 0 years.  Lung Cancer Screening: (Low Dose CT Chest recommended if Age 5-80 years, 30 pack-year currently smoking OR have quit  w/in 15years.) does not qualify.   Lung Cancer Screening Referral: N/A   Additional Screening:  Hepatitis C Screening: does qualify; Completed 04/01/2020  Vision Screening: Recommended annual ophthalmology exams for early detection of glaucoma and other disorders of the eye. Is the patient up to date with their annual eye exam?  Yes  Who is the provider or what is the name of the office in which the patient attends annual eye exams? Dr. Gershon Crane  If pt is not established with a provider, would they like to be referred to a provider to establish care? No .   Dental Screening: Recommended annual dental exams for proper oral hygiene  Community Resource Referral / Chronic Care Management: CRR required this visit?  No   CCM required this visit?  No      Plan:     I have personally reviewed and noted the following in the patient's chart:   . Medical and social history . Use of alcohol, tobacco or illicit drugs  . Current medications and supplements . Functional ability and status . Nutritional status . Physical activity . Advanced directives . List of other physicians . Hospitalizations, surgeries, and ER visits in previous 12 months . Vitals . Screenings to include cognitive, depression, and falls . Referrals and appointments  In addition, I have reviewed and discussed with patient certain preventive protocols, quality metrics, and best practice recommendations. A written personalized care plan for preventive services as well as general preventive health recommendations were provided to patient.     Ofilia Neas, LPN   3/47/4259   Nurse Notes: None

## 2021-01-06 ENCOUNTER — Ambulatory Visit (INDEPENDENT_AMBULATORY_CARE_PROVIDER_SITE_OTHER): Payer: Medicare Other

## 2021-01-06 DIAGNOSIS — Z0000 Encounter for general adult medical examination without abnormal findings: Secondary | ICD-10-CM

## 2021-01-06 NOTE — Patient Instructions (Signed)
Ellen Collins , Thank you for taking time to come for your Medicare Wellness Visit. I appreciate your ongoing commitment to your health goals. Please review the following plan we discussed and let me know if I can assist you in the future.   Screening recommendations/referrals: Colonoscopy: Up to date, next due 02/09/2022 Mammogram: Up to date, next due 11/27/2021 Bone Density: Up to date, next due 11/21/2024 Recommended yearly ophthalmology/optometry visit for glaucoma screening and checkup Recommended yearly dental visit for hygiene and checkup  Vaccinations: Influenza vaccine: Up to date, next due dall 70  Pneumococcal vaccine: Currently due, you may receive your next vaccine in our office or at your local pharmacy.  Tdap vaccine: Up to date, next due 07/16/2029 Shingles vaccine: Completed series     Advanced directives: Please bring in copies of your advanced medical directives so that we may scan them into your chart.   Conditions/risks identified: None   Next appointment: None    Preventive Care 68 Years and Older, Female Preventive care refers to lifestyle choices and visits with your health care provider that can promote health and wellness. What does preventive care include?  A yearly physical exam. This is also called an annual well check.  Dental exams once or twice a year.  Routine eye exams. Ask your health care provider how often you should have your eyes checked.  Personal lifestyle choices, including:  Daily care of your teeth and gums.  Regular physical activity.  Eating a healthy diet.  Avoiding tobacco and drug use.  Limiting alcohol use.  Practicing safe sex.  Taking low-dose aspirin every day.  Taking vitamin and mineral supplements as recommended by your health care provider. What happens during an annual well check? The services and screenings done by your health care provider during your annual well check will depend on your age, overall health,  lifestyle risk factors, and family history of disease. Counseling  Your health care provider may ask you questions about your:  Alcohol use.  Tobacco use.  Drug use.  Emotional well-being.  Home and relationship well-being.  Sexual activity.  Eating habits.  History of falls.  Memory and ability to understand (cognition).  Work and work Statistician.  Reproductive health. Screening  You may have the following tests or measurements:  Height, weight, and BMI.  Blood pressure.  Lipid and cholesterol levels. These may be checked every 5 years, or more frequently if you are over 30 years old.  Skin check.  Lung cancer screening. You may have this screening every year starting at age 68 if you have a 30-pack-year history of smoking and currently smoke or have quit within the past 15 years.  Fecal occult blood test (FOBT) of the stool. You may have this test every year starting at age 72.  Flexible sigmoidoscopy or colonoscopy. You may have a sigmoidoscopy every 5 years or a colonoscopy every 10 years starting at age 40.  Hepatitis C blood test.  Hepatitis B blood test.  Sexually transmitted disease (STD) testing.  Diabetes screening. This is done by checking your blood sugar (glucose) after you have not eaten for a while (fasting). You may have this done every 1-3 years.  Bone density scan. This is done to screen for osteoporosis. You may have this done starting at age 68.  Mammogram. This may be done every 1-2 years. Talk to your health care provider about how often you should have regular mammograms. Talk with your health care provider about your test results,  treatment options, and if necessary, the need for more tests. Vaccines  Your health care provider may recommend certain vaccines, such as:  Influenza vaccine. This is recommended every year.  Tetanus, diphtheria, and acellular pertussis (Tdap, Td) vaccine. You may need a Td booster every 10 years.  Zoster  vaccine. You may need this after age 68.  Pneumococcal 13-valent conjugate (PCV13) vaccine. One dose is recommended after age 68.  Pneumococcal polysaccharide (PPSV23) vaccine. One dose is recommended after age 68. Talk to your health care provider about which screenings and vaccines you need and how often you need them. This information is not intended to replace advice given to you by your health care provider. Make sure you discuss any questions you have with your health care provider. Document Released: 11/22/2015 Document Revised: 07/15/2016 Document Reviewed: 08/27/2015 Elsevier Interactive Patient Education  2017 [employer] Prevention in the Home Falls can cause injuries. They can happen to people of all ages. There are many things you can do to make your home safe and to help prevent falls. What can I do on the outside of my home?  Regularly fix the edges of walkways and driveways and fix any cracks.  Remove anything that might make you trip as you walk through a door, such as a raised step or threshold.  Trim any bushes or trees on the path to your home.  Use bright outdoor lighting.  Clear any walking paths of anything that might make someone trip, such as rocks or tools.  Regularly check to see if handrails are loose or broken. Make sure that both sides of any steps have handrails.  Any raised decks and porches should have guardrails on the edges.  Have any leaves, snow, or ice cleared regularly.  Use sand or salt on walking paths during winter.  Clean up any spills in your garage right away. This includes oil or grease spills. What can I do in the bathroom?  Use night lights.  Install grab bars by the toilet and in the tub and shower. Do not use towel bars as grab bars.  Use non-skid mats or decals in the tub or shower.  If you need to sit down in the shower, use a plastic, non-slip stool.  Keep the floor dry. Clean up any water that spills on the  floor as soon as it happens.  Remove soap buildup in the tub or shower regularly.  Attach bath mats securely with double-sided non-slip rug tape.  Do not have throw rugs and other things on the floor that can make you trip. What can I do in the bedroom?  Use night lights.  Make sure that you have a light by your bed that is easy to reach.  Do not use any sheets or blankets that are too big for your bed. They should not hang down onto the floor.  Have a firm chair that has side arms. You can use this for support while you get dressed.  Do not have throw rugs and other things on the floor that can make you trip. What can I do in the kitchen?  Clean up any spills right away.  Avoid walking on wet floors.  Keep items that you use a lot in easy-to-reach places.  If you need to reach something above you, use a strong step stool that has a grab bar.  Keep electrical cords out of the way.  Do not use floor polish or wax that makes floors  slippery. If you must use wax, use non-skid floor wax.  Do not have throw rugs and other things on the floor that can make you trip. What can I do with my stairs?  Do not leave any items on the stairs.  Make sure that there are handrails on both sides of the stairs and use them. Fix handrails that are broken or loose. Make sure that handrails are as long as the stairways.  Check any carpeting to make sure that it is firmly attached to the stairs. Fix any carpet that is loose or worn.  Avoid having throw rugs at the top or bottom of the stairs. If you do have throw rugs, attach them to the floor with carpet tape.  Make sure that you have a light switch at the top of the stairs and the bottom of the stairs. If you do not have them, ask someone to add them for you. What else can I do to help prevent falls?  Wear shoes that:  Do not have high heels.  Have rubber bottoms.  Are comfortable and fit you well.  Are closed at the toe. Do not wear  sandals.  If you use a stepladder:  Make sure that it is fully opened. Do not climb a closed stepladder.  Make sure that both sides of the stepladder are locked into place.  Ask someone to hold it for you, if possible.  Clearly mark and make sure that you can see:  Any grab bars or handrails.  First and last steps.  Where the edge of each step is.  Use tools that help you move around (mobility aids) if they are needed. These include:  Canes.  Walkers.  Scooters.  Crutches.  Turn on the lights when you go into a dark area. Replace any light bulbs as soon as they burn out.  Set up your furniture so you have a clear path. Avoid moving your furniture around.  If any of your floors are uneven, fix them.  If there are any pets around you, be aware of where they are.  Review your medicines with your doctor. Some medicines can make you feel dizzy. This can increase your chance of falling. Ask your doctor what other things that you can do to help prevent falls. This information is not intended to replace advice given to you by your health care provider. Make sure you discuss any questions you have with your health care provider. Document Released: 08/22/2009 Document Revised: 04/02/2016 Document Reviewed: 11/30/2014 Elsevier Interactive Patient Education  2017 [employer].

## 2021-01-14 ENCOUNTER — Other Ambulatory Visit: Payer: Self-pay | Admitting: Family Medicine

## 2021-01-14 DIAGNOSIS — G4700 Insomnia, unspecified: Secondary | ICD-10-CM

## 2021-03-25 ENCOUNTER — Other Ambulatory Visit: Payer: Self-pay | Admitting: Family Medicine

## 2021-03-25 DIAGNOSIS — F411 Generalized anxiety disorder: Secondary | ICD-10-CM

## 2021-04-01 ENCOUNTER — Telehealth: Payer: Self-pay | Admitting: Family Medicine

## 2021-04-01 NOTE — Chronic Care Management (AMB) (Signed)
  Chronic Care Management   Outreach Note  04/01/2021 Name: Ellen Collins MRN: [id number] DOB: [birth year]  Referred by: Caren Macadam, MD Reason for referral : No chief complaint on file.   A second unsuccessful telephone outreach was attempted today. The patient was referred to pharmacist for assistance with care management and care coordination.  Follow Up Plan:   Tatjana Dellinger [employer] scheduler

## 2021-04-08 ENCOUNTER — Telehealth: Payer: Self-pay | Admitting: Family Medicine

## 2021-04-08 NOTE — Chronic Care Management (AMB) (Signed)
  Chronic Care Management   Note  04/08/2021 Name: ANGELLEE COHILL MRN: [id number] DOB: 10-Oct-1951  CHEROKEE BOCCIO is a 70 y.o. year old female who is a primary care patient of Koberlein, Steele Berg, MD. I reached out to [name] by phone today in response to a referral sent by Ms. Susette Racer Drawdy's PCP, Caren Macadam, MD.   Ms. Mortellaro was given information about Chronic Care Management services today including:  1. CCM service includes personalized support from designated clinical staff supervised by her physician, including individualized plan of care and coordination with other care providers 2. 24/7 contact phone numbers for assistance for urgent and routine care needs. 3. Service will only be billed when office clinical staff spend 20 minutes or more in a month to coordinate care. 4. Only one practitioner may furnish and bill the service in a calendar month. 5. The patient may stop CCM services at any time (effective at the end of the month) by phone call to the office staff.   Patient agreed to services and verbal consent obtained.   Follow up plan:   Tatjana Secretary/administrator

## 2021-04-28 DIAGNOSIS — J343 Hypertrophy of nasal turbinates: Secondary | ICD-10-CM | POA: Diagnosis not present

## 2021-04-28 DIAGNOSIS — H903 Sensorineural hearing loss, bilateral: Secondary | ICD-10-CM | POA: Diagnosis not present

## 2021-04-28 DIAGNOSIS — J342 Deviated nasal septum: Secondary | ICD-10-CM | POA: Diagnosis not present

## 2021-04-28 DIAGNOSIS — R42 Dizziness and giddiness: Secondary | ICD-10-CM | POA: Diagnosis not present

## 2021-04-28 DIAGNOSIS — H9312 Tinnitus, left ear: Secondary | ICD-10-CM | POA: Diagnosis not present

## 2021-05-10 ENCOUNTER — Other Ambulatory Visit: Payer: Self-pay | Admitting: Family Medicine

## 2021-05-21 DIAGNOSIS — H6983 Other specified disorders of Eustachian tube, bilateral: Secondary | ICD-10-CM | POA: Diagnosis not present

## 2021-05-21 DIAGNOSIS — J310 Chronic rhinitis: Secondary | ICD-10-CM | POA: Diagnosis not present

## 2021-05-21 DIAGNOSIS — J342 Deviated nasal septum: Secondary | ICD-10-CM | POA: Diagnosis not present

## 2021-05-21 DIAGNOSIS — J343 Hypertrophy of nasal turbinates: Secondary | ICD-10-CM | POA: Diagnosis not present

## 2021-05-26 ENCOUNTER — Ambulatory Visit: Payer: Medicare Other

## 2021-05-29 ENCOUNTER — Telehealth: Payer: Self-pay | Admitting: Pharmacist

## 2021-05-29 NOTE — Chronic Care Management (AMB) (Signed)
Chronic Care Management Pharmacy Assistant   Name: ELAYNA TOBLER  MRN: [id number] DOB: [birth year]  Reason for Encounter: Chart Prep for CPP visit on 07.26.2022   Conditions to be addressed/monitored: HLD  Primary concerns for visit include: Medication Management  Recent office visits:  02.28.2022 Launa Grill, LPN Medicare Wellness exam 11.22.2021 Caren Macadam, MD seen for complete physical medication prescribed Escitalopram Oxalate 5 mg Oral Daily. Medication discontinued Nitrofurantoin Monohyd Macro 100 mg and prednisone 20 mg. 08.16.2021 Caren Macadam, MD (PCP) patient present for acute visit for ear pain. Medication prescribed Ciprofloxacin-Dexamethasone 0.3-0.1 % 4 drops Left EAR 2 times daily and Prednisone 20 MG 3 tabs daily x 2 days, 2 tabs daily x 4 days, 1 tab daily x 3 days, 1/2 tab daily x 2 days.  Recent consult visits:  12.09.2021 Hyman Hopes, MD Ophthalmology - yearly eye examination 01.21.2021 Leta Baptist Otolaryngology follow-up for Sensorineural hearing loss, bilateral 09.28.2021 Leta Baptist Otolaryngology follow-up for Sensorineural hearing loss, bilateral 08.17.2021 Rometta Emery Otolaryngology- new appointment seen for Sensorineural hearing loss, bilateral   Hospital visits:  None in previous 6 months  Medications: Outpatient Encounter Medications as of 05/29/2021  Medication Sig   ALPRAZolam (XANAX) 0.5 MG tablet Take 0.5-1 tablets (0.25-0.5 mg total) by mouth as needed for anxiety.   betamethasone valerate lotion (VALISONE) 0.1 % Apply 1 application topically 2 (two) times daily.   cholecalciferol (VITAMIN D) 1000 UNITS tablet Take 2,000 Units by mouth daily.    ciprofloxacin-dexamethasone (CIPRODEX) OTIC suspension Place 4 drops into the left ear 2 (two) times daily. (Patient not taking: Reported on 01/06/2021)   escitalopram (LEXAPRO) 5 MG tablet TAKE 1 TABLET BY MOUTH EVERY DAY   estradiol (ESTRACE) 1 MG tablet TAKE 1 TABLET BY  MOUTH  DAILY   famotidine (PEPCID) 20 MG tablet Take 1 tablet (20 mg total) by mouth daily.   fluticasone (FLONASE) 50 MCG/ACT nasal spray Place 1 spray into both nostrils daily.   loratadine (CLARITIN) 10 MG tablet Take 5 mg by mouth as needed. allergies   Multiple Vitamins-Minerals (MULTIVITAMIN WITH MINERALS) tablet Take 1 tablet by mouth daily.   NON FORMULARY Viviscal hair vitamin-Take one daily   Omega-3 Fatty Acids (FISH OIL) 1000 MG CAPS Take 1 capsule by mouth daily. Take 350 mg daily   traZODone (DESYREL) 50 MG tablet TAKE 1 TABLET BY MOUTH EVERYDAY AT BEDTIME   Turmeric 500 MG TABS Take 500 mg by mouth daily.   zolpidem (AMBIEN) 5 MG tablet Take 1 tablet (5 mg total) by mouth at bedtime as needed for sleep.   Facility-Administered Encounter Medications as of 05/29/2021  Medication   0.9 %  sodium chloride infusion  Patient Questions:   Have you seen any other providers since your last visit?  ENT Any changes in your medications or health? No Any side effects from any medications? No Do you have an symptoms or problems not managed by your medications? No Any concerns about your health right now? No Has your provider asked that you check blood pressure, blood sugar, or follow special diet at home? No Do you get any type of exercise on a regular basis?  She walks 3 times a week Swimming Can you think of a goal you would like to reach for your health?  Loss some weight Do you have any problems getting your medications? No Is there anything that you would like to discuss during the appointment? No  The patient was asked to  please bring medications, blood pressure/ blood sugar log, and supplements to her appointment.    Date- Patient called to remind of appointment with Watt Climes on 07.26.2022 at 9:00 am  Patient aware of appointment date, time, and type of appointment (either telephone or in person). Patient aware to have/bring all medications, supplements, blood pressure and/or  blood sugar logs to visit.  Care Gaps: COVID-19 4th Booster  Star Rating Drugs: None  Arlis Porta Revonda Standard, [place] Pharmacist Assistant [phone]

## 2021-06-03 ENCOUNTER — Other Ambulatory Visit: Payer: Self-pay

## 2021-06-03 ENCOUNTER — Ambulatory Visit (INDEPENDENT_AMBULATORY_CARE_PROVIDER_SITE_OTHER): Payer: Medicare Other | Admitting: Pharmacist

## 2021-06-03 DIAGNOSIS — E782 Mixed hyperlipidemia: Secondary | ICD-10-CM

## 2021-06-03 DIAGNOSIS — G4700 Insomnia, unspecified: Secondary | ICD-10-CM

## 2021-06-03 NOTE — Progress Notes (Signed)
Chronic Care Management Pharmacy Note  06/08/2021 Name:  Ellen Collins MRN:  [id number] DOB:  November 11, 1950  Summary: LDL not at goal < 100   Recommendations/Changes made from today's visit: -Recommend repeat lipid panel -Based on ASCVD risk of 8.2% and LDL > 100, recommended CAC scoring to determine recommendation for statin therapy. -Recommended vitamin D supplementation between 1000-2000 units daily -Consider restarting escitalopram and remaining on therapy for 4-8 weeks to determine benefit   Plan: Follow up in 6 months   Subjective: Ellen Collins is an 70 y.o. year old female who is a primary patient of Koberlein, Steele Berg, MD.  The CCM team was consulted for assistance with disease management and care coordination needs.    Engaged with patient face to face for initial visit in response to provider referral for pharmacy case management and/or care coordination services.   Consent to Services:  The patient was given the following information about Chronic Care Management services today, agreed to services, and gave verbal consent: 1. CCM service includes personalized support from designated clinical staff supervised by the primary care provider, including individualized plan of care and coordination with other care providers 2. 24/7 contact phone numbers for assistance for urgent and routine care needs. 3. Service will only be billed when office clinical staff spend 20 minutes or more in a month to coordinate care. 4. Only one practitioner may furnish and bill the service in a calendar month. 5.The patient may stop CCM services at any time (effective at the end of the month) by phone call to the office staff. 6. The patient will be responsible for cost sharing (co-pay) of up to 20% of the service fee (after annual deductible is met). Patient agreed to services and consent obtained.  Patient Care Team: Caren Macadam, MD as PCP - General (Family Medicine) Martinique, Amy, MD as  Consulting Physician (Dermatology) Janyth Contes, MD as Consulting Physician (Obstetrics and Gynecology) Viona Gilmore, [facility] as Pharmacist (Pharmacist)  Recent office visits: 01/06/21 Launa Grill, LPN: Patient presented for Medicare Wellness exam  09/30/20 Caren Macadam, MD: Patient presented for complete physical. Prescribed Escitalopram Oxalate 5 mg Oral Daily. Removed nitrofurantoin 100 mg and prednisone 20 mg from medication list.  Recent consult visits: 10/17/20 Hyman Hopes, MD (Ophthalmology): Patient presented for yearly eye examination.  11/30/19 Leta Baptist (Otolaryngology): Patient presented for follow-up for Sensorineural hearing loss, bilateral.  Hospital visits: None in previous 6 months   Objective:  Lab Results  Component Value Date   CREATININE 0.83 04/01/2020   BUN 12 04/01/2020   GFR 68.50 04/01/2020   GFRNONAA >60 05/26/2017   GFRAA >60 05/26/2017   NA 136 04/01/2020   K 4.3 04/01/2020   CALCIUM 9.4 04/01/2020   CO2 29 04/01/2020   GLUCOSE 115 (H) 04/01/2020    Lab Results  Component Value Date/Time   HGBA1C 6.1 04/01/2020 09:55 AM   HGBA1C 6.2 09/25/2019 09:28 AM   GFR 68.50 04/01/2020 09:55 AM   GFR 67.67 09/25/2019 09:28 AM    Last diabetic Eye exam: No results found for: HMDIABEYEEXA  Last diabetic Foot exam: No results found for: HMDIABFOOTEX   Lab Results  Component Value Date   CHOL 228 (H) 04/01/2020   HDL 64.50 04/01/2020   LDLCALC 124 (H) 04/01/2020   LDLDIRECT 126.0 09/25/2019   TRIG 198.0 (H) 04/01/2020   CHOLHDL 4 04/01/2020    Hepatic Function Latest Ref Rng & Units 04/01/2020 09/25/2019 10/24/2018  Total Protein  6.0 - 8.3 g/dL 7.2 7.2 6.9  Albumin 3.5 - 5.2 g/dL 4.5 4.5 4.4  AST 0 - 37 U/L _0 ALT 0 - 35 U/L _1 Alk Phosphatase 39 - 117 U/L 50 49 44  Total Bilirubin 0.2 - 1.2 mg/dL 0.6 0.6 0.5  Bilirubin, Direct 0.1 - 0.5 mg/dL - - -    Lab Results  Component Value Date/Time   TSH  1.68 09/25/2019 09:28 AM   TSH 1.31 10/24/2018 10:15 AM    CBC Latest Ref Rng & Units 04/01/2020 09/25/2019 10/24/2018  WBC 4.0 - 10.5 K/uL 5.1 7.0 5.8  Hemoglobin 12.0 - 15.0 g/dL 13.5 13.3 13.7  Hematocrit 36.0 - 46.0 % 39.9 39.0 40.4  Platelets 150.0 - 400.0 K/uL 297.0 319.0 307.0    No results found for: VD25OH  Clinical ASCVD: No  8.2% per risk calculator with last BP The ASCVD Risk score (Sterling., et al., 2013) failed to calculate for the following reasons:   The systolic blood pressure is missing    Depression screen [facility] 2/9 01/06/2021 09/30/2020  Decreased Interest 0 0  Down, Depressed, Hopeless 0 0  PHQ - 2 Score 0 0     Social History   Tobacco Use  Smoking Status Never  Smokeless Tobacco Never   BP Readings from Last 3 Encounters:  09/30/20 132/82  06/24/20 130/78  04/01/20 (!) 142/76   Pulse Readings from Last 3 Encounters:  09/30/20 91  06/24/20 100  04/01/20 (!) 102   Wt Readings from Last 3 Encounters:  09/30/20 168 lb 12.8 oz (76.6 kg)  06/24/20 165 lb 14.4 oz (75.3 kg)  04/01/20 163 lb (73.9 kg)   BMI Readings from Last 3 Encounters:  09/30/20 27.25 kg/m  06/24/20 26.78 kg/m  04/01/20 26.31 kg/m    Assessment/Interventions: Review of patient past medical history, allergies, medications, health status, including review of consultants reports, laboratory and other test data, was performed as part of comprehensive evaluation and provision of chronic care management services.   SDOH:  (Social Determinants of Health) assessments and interventions performed: Yes SDOH Interventions    Flowsheet Row Most Recent Value  SDOH Interventions   Financial Strain Interventions Intervention Not Indicated  Transportation Interventions Intervention Not Indicated      SDOH Screenings   Alcohol Screen: Low Risk    Last Alcohol Screening Score (AUDIT): 3  Depression (PHQ2-9): Low Risk    PHQ-2 Score: 0  Financial Resource Strain: Low Risk     Difficulty of Paying Living Expenses: Not hard at all  Food Insecurity: No Food Insecurity   Worried About Charity fundraiser in the Last Year: Never true   Ran Out of Food in the Last Year: Never true  Housing: Low Risk    Last Housing Risk Score: 0  Physical Activity: Inactive   Days of Exercise per Week: 0 days   Minutes of Exercise per Session: 0 min  Social Connections: Engineer, building services of Communication with Friends and Family: More than three times a week   Frequency of Social Gatherings with Friends and Family: Twice a week   Attends Religious Services: 1 to 4 times per year   Active Member of [employer] or Organizations: Yes   Attends Archivist Meetings: 1 to 4 times per year   Marital Status: Married  Stress: No Stress Concern Present   Feeling of Stress : Not at all  Tobacco Use: Low Risk  Smoking Tobacco Use: Never   Smokeless Tobacco Use: Never  Transportation Needs: No Transportation Needs   Lack of Transportation (Medical): No   Lack of Transportation (Non-Medical): No   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 She does travel there once a week but works off site throughout the rest of the week. She does stay active throughout the day and gets about 13000 steps in a day. She also swims in her pool a couple times a week.   Patient just returned from a trip to [place], [place] where she spent 7 days white water rafting. She also goes to the beach during the fall and winter as her family owns a beach house.  Patient is not on a specific diet right now but has been on weight watchers in the past. She currently tries to limit bread. She eats a lot of vegetables and salads and drinks slim fast. For meat, she usually has chicken and beef and eats 3 meals a day. She does eat some desserts and doesn't eat out much. She usually drinks water and decaf tea and has about 2 vodka tonics on the weekend.  Patient does not sleep very  well but does use Ambien to help with sleep. She takes about 1/2 tablet per night and this helps her get 5-6 hours of sleep. She doesn't consistently get good sleep and does not nap during the day. She will sit down to rest but does not fall asleep. She did not sleep well during her trip dealing with jet lag.  Patient denies any problems with her medications currently.   CCM Care Plan  Allergies  Allergen Reactions   Latex     Itching, and redness    Medications Reviewed Today     Reviewed by Viona Gilmore, [facility] (Pharmacist) on 06/03/21 at [phone]  Med List Status: <None>   Medication Order Taking? Sig Documenting Provider Last Dose Status Informant  0.9 %  sodium chloride infusion [id number]   Gatha Mayer, MD  Active   ALPRAZolam Duanne Moron) 0.5 MG tablet [id number] Yes Take 0.5-1 tablets (0.25-0.5 mg total) by mouth as needed for anxiety. Caren Macadam, MD Taking Active   azelastine (ASTELIN) 0.1 % nasal spray [id number] Yes Place 1 spray into both nostrils 2 (two) times daily. Use in each nostril as directed [provider] Taking Active   betamethasone valerate lotion (VALISONE) 0.1 % [id number] Yes Apply 1 application topically 2 (two) times daily. Caren Macadam, MD Taking Active   cholecalciferol (VITAMIN D) 1000 UNITS tablet [id number] Yes Take 2,000 Units by mouth daily.  [provider] Taking Active Self  escitalopram (LEXAPRO) 5 MG tablet [id number] No TAKE 1 TABLET BY MOUTH EVERY DAY  Patient not taking: Reported on 06/03/2021   Caren Macadam, MD Not Taking Active   estradiol (ESTRACE) 1 MG tablet [id number] Yes TAKE 1 TABLET BY MOUTH  DAILY Koberlein, Junell C, MD Taking Active   fluticasone (FLONASE) 50 MCG/ACT nasal spray [id number] Yes Place 1 spray into both nostrils daily. [provider] Taking Active   loratadine (CLARITIN) 10 MG tablet [id number] Yes Take 5 mg by mouth as needed. allergies [provider] Taking Active Self   Multiple Vitamins-Minerals (MULTIVITAMIN WITH MINERALS) tablet [id number] Yes Take 1 tablet by mouth daily. [provider] Taking Active Self  Baruch Gouty [id number] Yes Viviscal hair vitamin-Take one daily [provider] Taking Active Self  Omega-3 Fatty Acids (FISH OIL) 1000  MG CAPS [id number] Yes Take 1 capsule by mouth daily. Take 350 mg daily [provider] Taking Active Self  Turmeric 500 MG TABS [id number] Yes Take 500 mg by mouth daily. Caren Macadam, MD Taking Active   vitamin C (ASCORBIC ACID) 500 MG tablet [id number] Yes Take 500 mg by mouth daily. [provider] Taking Active   zolpidem (AMBIEN) 5 MG tablet [id number] Yes Take 1 tablet (5 mg total) by mouth at bedtime as needed for sleep. Caren Macadam, MD Taking Active             Patient Active Problem List   Diagnosis Date Noted   Basal cell carcinoma 10/24/2018   Mixed hyperlipidemia 11/13/2016   Insomnia 11/13/2016   Lumbar disc disease with radiculopathy 08/14/2014   Back pain 04/12/2013   NECK PAIN 01/09/2010   PANIC DISORDER 07/19/2009   DYSPHAGIA 06/17/2009   Anxiety state 11/16/2008   Allergic rhinitis 11/16/2008    Immunization History  Administered Date(s) Administered   Influenza Split 08/02/2012   Influenza, High Dose Seasonal PF 07/29/2018   Influenza,inj,Quad PF,6+ Mos 07/20/2013, 08/13/2014, 08/16/2015, 08/12/2016, 08/10/2017   Influenza-Unspecified 07/08/2019, 07/19/2020   PFIZER(Purple Top)SARS-COV-2 Vaccination 12/14/2019, 01/08/2020, 08/05/2020   Pneumococcal Polysaccharide-23 07/08/2019   Td 11/09/2005   Tdap 07/17/2019   Zoster Recombinat (Shingrix) 04/29/2020, 07/19/2020   Zoster, Live 04/17/2013    Conditions to be addressed/monitored:  Hyperlipidemia, GERD, Anxiety, Allergic Rhinitis, and Insomnia, Postmenopausal symptoms  Care Plan : [place]  Updates made by Viona Gilmore, [place] since 06/08/2021 12:00 AM     Problem:  Problem: Hyperlipidemia, GERD, Anxiety, Allergic Rhinitis, and Insomnia, Postmenopausal symptoms      Long-Range Goal: Patient-Specific Goal   Start Date: 06/03/2021  Expected End Date: 06/03/2022  This Visit's Progress: On track  Priority: High  Note:   Current Barriers:  Unable to independently monitor therapeutic efficacy  Pharmacist Clinical Goal(s):  Patient will achieve adherence to monitoring guidelines and medication adherence to achieve therapeutic efficacy through collaboration with PharmD and provider.   Interventions: 1:1 collaboration with Caren Macadam, MD regarding development and update of comprehensive plan of care as evidenced by provider attestation and co-signature Inter-disciplinary care team collaboration (see longitudinal plan of care) Comprehensive medication review performed; medication list updated in electronic medical record  Hyperlipidemia: (LDL goal < 100) -Uncontrolled -Current treatment: Fish oil 1000 mg 1 capsule twice daily -Medications previously tried: none  -Current dietary patterns: eats leaner meats; eating salads and lots of vegetables -Current exercise habits: active throughout the day -Educated on Cholesterol goals;  Importance of limiting foods high in cholesterol; Exercise goal of 150 minutes per week; -Counseled on diet and exercise extensively Recommended to continue current medication Recommended repeat lipid panel  Anxiety (Goal: minimize symptoms) -Not ideally controlled -Current treatment: Escitalopram 5 mg 1 tablet daily - not taking Alprazolam 0.5 mg 1/2-1 tablet as needed -Medications previously tried/failed: none -GAD7: n/a -Educated on Benefits of medication for symptom control -Counseled on benefits of escitalopram will not be seen until 4-8 weeks of continued use. Patient will consider restarting.  Insomnia (Goal: improve quality and quantity of sleep) -Not ideally controlled -Current treatment  Zolpidem 5 mg  1 tablet at bedtime as needed (taking 1/2 tablet every night) -Medications previously tried: trazodone (ineffective)  -Counseled on practicing good sleep hygiene by setting a sleep schedule and maintaining it, avoid excessive napping, following a nightly routine, avoiding screen time for 30-60 minutes before going to bed, and  making the bedroom a cool, quiet and dark space  Allergic rhinitis (Goal: minimize symptoms) -Controlled -Current treatment  Fluticasone nasal spray 1 spray in both nostrils daily Azelastine nasal spray as needed Zyrtec 5 mg daily -Medications previously tried: claritin (alternates every few months with Zyrtec)  -Recommended to continue current medication  GERD/dysphagia (Goal: minimize symptoms) -Controlled -Current treatment  Famotidine 20 mg 1 tablet daily -Medications previously tried: none  -Recommended to continue current medication Patient denies any problems with heartburn or swallowing.  Post menopausal symptoms (Goal: minimize symptoms) -Controlled -Current treatment  Estradiol 1 mg 1 tablet daily -Medications previously tried: none  -Counseled on risks of long term estrogen therapy and patient is aware and does not want to discontinue.    Health Maintenance -Vaccine gaps: COVID booster, Prevnar -Current therapy:  Multivitamin 1 tablet daily Turmeric 500 mg 1 tablet daily Hair vitamin daily Vitamin D 5000 units 1 tablet daily -Educated on Cost vs benefit of each product must be carefully weighed by individual consumer -Patient is satisfied with current therapy and denies issues -Counseled on limiting supplementation of vitamin D to 1000 to 2000 units daily.  Patient Goals/Self-Care Activities Patient will:  - take medications as prescribed target a minimum of 150 minutes of moderate intensity exercise weekly engage in dietary modifications by increasing fiber intake to lower cholesterol  Follow Up Plan: Telephone follow up appointment with  care management team member scheduled for: 6 months        Medication Assistance: None required.  Patient affirms current coverage meets needs.  Compliance/Adherence/Medication fill history: Care Gaps: COVID booster  Star-Rating Drugs: None  Patient's preferred pharmacy is:  [id number] [place], [place] - [address] [place]Phone: [phone]: [phone] [facility] [address] [place]Phone: [phone]: [address] [place]: [phone]: [phone] [facility]  [address] [address] [address]Phone: [phone]: [phone] Uses pill box? Yes - 2 weeks Pt endorses 100% compliance  We discussed: Current pharmacy is preferred with insurance plan and patient is satisfied with pharmacy services Patient decided to: Continue current medication management strategy  Care Plan and Follow Up Patient Decision:  Patient agrees to Care Plan and Follow-up.  Plan: Telephone follow up appointment with care management team member scheduled for:  6 months  MJeni Salles PharmD, BLake MysticPharmacist [phone]

## 2021-06-08 NOTE — Patient Instructions (Addendum)
Hi Pam,  It was great to get to meet you in person! Below is a summary of some of the topics we discussed.   Please reach out to me if you have any questions or need anything before our follow up.   I scheduled you for a follow up on Jan 26th at [place] over the telephone. Please send me a message if that time/date will not work!  Best, Maddie  [name], PharmD, [facility] [phone]   Visit Information   Goals Addressed   None    Patient Care Plan: CCM Pharmacy Care Plan     Problem Identified: Problem: Hyperlipidemia, GERD, Anxiety, Allergic Rhinitis, and Insomnia, Postmenopausal symptoms      Long-Range Goal: Patient-Specific Goal   Start Date: 06/03/2021  Expected End Date: 06/03/2022  This Visit's Progress: On track  Priority: High  Note:   Current Barriers:  Unable to independently monitor therapeutic efficacy  Pharmacist Clinical Goal(s):  Patient will achieve adherence to monitoring guidelines and medication adherence to achieve therapeutic efficacy through collaboration with PharmD and provider.   Interventions: 1:1 collaboration with Caren Macadam, MD regarding development and update of comprehensive plan of care as evidenced by provider attestation and co-signature Inter-disciplinary care team collaboration (see longitudinal plan of care) Comprehensive medication review performed; medication list updated in electronic medical record  Hyperlipidemia: (LDL goal < 100) -Uncontrolled -Current treatment: Fish oil 1000 mg 1 capsule twice daily -Medications previously tried: none  -Current dietary patterns: eats leaner meats; eating salads and lots of vegetables -Current exercise habits: active throughout the day -Educated on Cholesterol goals;  Importance of limiting foods high in cholesterol; Exercise goal of 150 minutes per week; -Counseled on diet and exercise extensively Recommended to continue  current medication Recommended repeat lipid panel  Anxiety (Goal: minimize symptoms) -Not ideally controlled -Current treatment: Escitalopram 5 mg 1 tablet daily - not taking Alprazolam 0.5 mg 1/2-1 tablet as needed -Medications previously tried/failed: none -GAD7: n/a -Educated on Benefits of medication for symptom control -Counseled on benefits of escitalopram will not be seen until 4-8 weeks of continued use. Patient will consider restarting.  Insomnia (Goal: improve quality and quantity of sleep) -Not ideally controlled -Current treatment  Zolpidem 5 mg 1 tablet at bedtime as needed (taking 1/2 tablet every night) -Medications previously tried: trazodone (ineffective)  -Counseled on practicing good sleep hygiene by setting a sleep schedule and maintaining it, avoid excessive napping, following a nightly routine, avoiding screen time for 30-60 minutes before going to bed, and making the bedroom a cool, quiet and dark space  Allergic rhinitis (Goal: minimize symptoms) -Controlled -Current treatment  Fluticasone nasal spray 1 spray in both nostrils daily Azelastine nasal spray as needed Zyrtec 5 mg daily -Medications previously tried: claritin (alternates every few months with Zyrtec)  -Recommended to continue current medication  GERD/dysphagia (Goal: minimize symptoms) -Controlled -Current treatment  Famotidine 20 mg 1 tablet daily -Medications previously tried: none  -Recommended to continue current medication Patient denies any problems with heartburn or swallowing.  Post menopausal symptoms (Goal: minimize symptoms) -Controlled -Current treatment  Estradiol 1 mg 1 tablet daily -Medications previously tried: none  -Counseled on risks of long term estrogen therapy and patient is aware and does not want to discontinue.    Health Maintenance -Vaccine gaps: COVID booster, Prevnar -Current therapy:  Multivitamin 1 tablet daily Turmeric 500 mg 1 tablet daily Hair  vitamin daily Vitamin D 5000 units 1 tablet daily -Educated on Cost  vs benefit of each product must be carefully weighed by individual consumer -Patient is satisfied with current therapy and denies issues -Counseled on limiting supplementation of vitamin D to 1000 to 2000 units daily.  Patient Goals/Self-Care Activities Patient will:  - take medications as prescribed target a minimum of 150 minutes of moderate intensity exercise weekly engage in dietary modifications by increasing fiber intake to lower cholesterol  Follow Up Plan: Telephone follow up appointment with care management team member scheduled for: 6 months      Ms. Hurless was given information about Chronic Care Management services today including:  CCM service includes personalized support from designated clinical staff supervised by her physician, including individualized plan of care and coordination with other care providers 24/7 contact phone numbers for assistance for urgent and routine care needs. Standard insurance, coinsurance, copays and deductibles apply for chronic care management only during months in which we provide at least 20 minutes of these services. Most insurances cover these services at 100%, however patients may be responsible for any copay, coinsurance and/or deductible if applicable. This service may help you avoid the need for more expensive face-to-face services. Only one practitioner may furnish and bill the service in a calendar month. The patient may stop CCM services at any time (effective at the end of the month) by phone call to the office staff.  Patient agreed to services and verbal consent obtained.   Patient verbalizes understanding of instructions provided today and agrees to view in [place].  Telephone follow up appointment with pharmacy team member scheduled for: 6 months  Viona Gilmore, [facility]

## 2021-07-05 ENCOUNTER — Other Ambulatory Visit: Payer: Self-pay | Admitting: Family Medicine

## 2021-07-05 DIAGNOSIS — G4700 Insomnia, unspecified: Secondary | ICD-10-CM

## 2021-08-27 ENCOUNTER — Telehealth: Payer: Self-pay | Admitting: Pharmacist

## 2021-08-27 NOTE — Chronic Care Management (AMB) (Signed)
    Chronic Care Management Pharmacy Assistant   Name: Ellen Collins  MRN: [id number] DOB: Dec 15, 1950   Reason for Encounter: Reschedule Appointment with [name] as she will be at a different office on Thursdays per Thurmond Butts PTM.   Call to patient got voice mail left [facility] to advise patient that her appointment for 12/04/21 with [name] had been cancelled as she will not be seeing [facility] patients on thursdays, also advised patient I had rescheduled her for 12/08/21 @ 10:30 and if that date or time did not work for her schedule she may return my call to change the appointment.  Medications: Outpatient Encounter Medications as of 08/27/2021  Medication Sig   ALPRAZolam (XANAX) 0.5 MG tablet Take 0.5-1 tablets (0.25-0.5 mg total) by mouth as needed for anxiety.   azelastine (ASTELIN) 0.1 % nasal spray Place 1 spray into both nostrils 2 (two) times daily. Use in each nostril as directed   betamethasone valerate lotion (VALISONE) 0.1 % Apply 1 application topically 2 (two) times daily.   cholecalciferol (VITAMIN D) 1000 UNITS tablet Take 2,000 Units by mouth daily.    escitalopram (LEXAPRO) 5 MG tablet TAKE 1 TABLET BY MOUTH EVERY DAY (Patient not taking: Reported on 06/03/2021)   estradiol (ESTRACE) 1 MG tablet TAKE 1 TABLET BY MOUTH  DAILY   fluticasone (FLONASE) 50 MCG/ACT nasal spray Place 1 spray into both nostrils daily.   loratadine (CLARITIN) 10 MG tablet Take 5 mg by mouth as needed. allergies   Multiple Vitamins-Minerals (MULTIVITAMIN WITH MINERALS) tablet Take 1 tablet by mouth daily.   NON FORMULARY Viviscal hair vitamin-Take one daily   Omega-3 Fatty Acids (FISH OIL) 1000 MG CAPS Take 1 capsule by mouth daily. Take 350 mg daily   traZODone (DESYREL) 50 MG tablet TAKE 1 TABLET BY MOUTH EVERYDAY AT BEDTIME   Turmeric 500 MG TABS Take 500 mg by mouth daily.   vitamin C (ASCORBIC ACID) 500 MG tablet Take 500 mg by mouth daily.   zolpidem (AMBIEN) 5 MG tablet Take  1 tablet (5 mg total) by mouth at bedtime as needed for sleep.   Facility-Administered Encounter Medications as of 08/27/2021  Medication   0.9 %  sodium chloride infusion    Care Gaps: PNA Vaccine - Overdue COVID Booster #4 - Therapist, music) - Overdue Flu Vaccine - Overdue AWV - 12/27/20  Star Rating Drugs: None  Ned Clines [facility] Clinical Pharmacist Assistant [phone]

## 2021-10-20 DIAGNOSIS — H5213 Myopia, bilateral: Secondary | ICD-10-CM | POA: Diagnosis not present

## 2021-10-20 DIAGNOSIS — H25813 Combined forms of age-related cataract, bilateral: Secondary | ICD-10-CM | POA: Diagnosis not present

## 2021-10-20 DIAGNOSIS — H52203 Unspecified astigmatism, bilateral: Secondary | ICD-10-CM | POA: Diagnosis not present

## 2021-10-20 DIAGNOSIS — H524 Presbyopia: Secondary | ICD-10-CM | POA: Diagnosis not present

## 2021-10-31 ENCOUNTER — Telehealth: Payer: Medicare Other | Admitting: Emergency Medicine

## 2021-10-31 DIAGNOSIS — B9789 Other viral agents as the cause of diseases classified elsewhere: Secondary | ICD-10-CM

## 2021-10-31 DIAGNOSIS — J019 Acute sinusitis, unspecified: Secondary | ICD-10-CM | POA: Diagnosis not present

## 2021-10-31 MED ORDER — FLUTICASONE PROPIONATE 50 MCG/ACT NA SUSP: 2.0000 | Freq: Every day | NASAL | 0 refills | Status: DC

## 2021-10-31 MED ORDER — AZELASTINE HCL 0.1 % NA SOLN: 2.0000 | Freq: Two times a day (BID) | NASAL | 0 refills | Status: DC

## 2021-10-31 NOTE — Progress Notes (Signed)
E-Visit for Sinus Problems  We are sorry that you are not feeling well.  Here is how we plan to help!  Based on what you have shared with me it looks like you have sinusitis.  Sinusitis is inflammation and infection in the sinus cavities of the head.  Based on your presentation I believe you most likely have Acute Viral Sinusitis.This is an infection most likely caused by a virus. There is not specific treatment for viral sinusitis other than to help you with the symptoms until the infection runs its course.  You may use an oral decongestant such as Mucinex D or if you have glaucoma or high blood pressure use plain Mucinex. Saline nasal spray help and can safely be used as often as needed for congestion, I have prescribed: Fluticasone nasal spray two sprays in each nostril once a day and Azelastine nasal spray 2 sprays in each nostril twice a day. I see these medicines are already on your med list - I have prescribed them in case you are out. If you still have these medicines at home, please resume using them.   Nasal saline spray or nasal saline irrigation will help your congestion. I recommend using it several times a day to help relieve your congestion and sinus pressure.   Some authorities believe that zinc sprays or the use of Echinacea may shorten the course of your symptoms.  Sinus infections are not as easily transmitted as other respiratory infection, however we still recommend that you avoid close contact with loved ones, especially the very young and elderly.  Remember to wash your hands thoroughly throughout the day as this is the number one way to prevent the spread of infection!  Home Care: Only take medications as instructed by your medical team. Do not take these medications with alcohol. A steam or ultrasonic humidifier can help congestion.  You can place a towel over your head and breathe in the steam from hot water coming from a faucet. Avoid close contacts especially the very  young and the elderly. Cover your mouth when you cough or sneeze. Always remember to wash your hands.  Get Help Right Away If: You develop worsening fever or sinus pain. You develop a severe head ache or visual changes. Your symptoms persist after you have completed your treatment plan.  Make sure you Understand these instructions. Will watch your condition. Will get help right away if you are not doing well or get worse.   Thank you for choosing an e-visit.  Your e-visit answers were reviewed by a board certified advanced clinical practitioner to complete your personal care plan. Depending upon the condition, your plan could have included both over the counter or prescription medications.  Please review your pharmacy choice. Make sure the pharmacy is open so you can pick up prescription now. If there is a problem, you may contact your provider through [employer] and have the prescription routed to another pharmacy.  Your safety is important to [place]. If you have drug allergies check your prescription carefully.   For the next 24 hours you can use MyChart to ask questions about today's visit, request a non-urgent call back, or ask for a work or school excuse. You will get an email in the next two days asking about your experience. I hope that your e-visit has been valuable and will speed your recovery.  I have spent 5 minutes in review of e-visit questionnaire, review and updating patient chart, medical decision making and response  to patient.   Willeen Cass, PhD, FNP-BC

## 2021-12-02 DIAGNOSIS — M4316 Spondylolisthesis, lumbar region: Secondary | ICD-10-CM | POA: Diagnosis not present

## 2021-12-02 DIAGNOSIS — M5416 Radiculopathy, lumbar region: Secondary | ICD-10-CM | POA: Diagnosis not present

## 2021-12-02 DIAGNOSIS — M48062 Spinal stenosis, lumbar region with neurogenic claudication: Secondary | ICD-10-CM | POA: Diagnosis not present

## 2021-12-03 DIAGNOSIS — Z1231 Encounter for screening mammogram for malignant neoplasm of breast: Secondary | ICD-10-CM | POA: Diagnosis not present

## 2021-12-03 LAB — HM MAMMOGRAPHY

## 2021-12-04 ENCOUNTER — Telehealth: Payer: Medicare Other

## 2021-12-04 ENCOUNTER — Telehealth: Payer: Self-pay | Admitting: Pharmacist

## 2021-12-04 NOTE — Chronic Care Management (AMB) (Signed)
° ° °  Chronic Care Management Pharmacy Assistant   Name: Ellen Collins  MRN: [id number] DOB: [birth year]  12/08/2021 APPOINTMENT REMINDER  [name] was reminded to have all medications, supplements and any blood glucose and blood pressure readings available for review with [name], Pharm. D, at her telephone visit on 12/08/2021 at 10:30.   Questions: Have you had any recent office visit or specialist visit outside of [facility]? Patient recently saw Dr. Gershon Crane for an eye exam and 09/02/2022 she had a consult with Dr. Brien Few, neurosurgery.  Are there any concerns you would like to discuss during your office visit? Patient denies any concerns.  Are you having any problems obtaining your medications? (Whether it pharmacy issues or cost) Patient denies any issues getting medications.  If patient has any PAP medications ask if they are having any problems getting their PAP medication or refill? Patient denies any medications coming from PAP.  Care Gaps: AWV - completed 12/26/2020 Last BP - 132/82 on 09/30/2020 Pneumonia vaccine - overdue Covid vaccine - overdue Mammogram - per patient completed 12/03/21  Star Rating Drug: None  Any gaps in medications fill history? No  [name] [facility]  Catering manager [phone]

## 2021-12-05 ENCOUNTER — Encounter: Payer: Self-pay | Admitting: Family Medicine

## 2021-12-08 ENCOUNTER — Ambulatory Visit (INDEPENDENT_AMBULATORY_CARE_PROVIDER_SITE_OTHER): Payer: Medicare Other | Admitting: Pharmacist

## 2021-12-08 DIAGNOSIS — G4700 Insomnia, unspecified: Secondary | ICD-10-CM

## 2021-12-08 DIAGNOSIS — F411 Generalized anxiety disorder: Secondary | ICD-10-CM

## 2021-12-08 DIAGNOSIS — E782 Mixed hyperlipidemia: Secondary | ICD-10-CM

## 2021-12-08 NOTE — Progress Notes (Signed)
Chronic Care Management Pharmacy Note  12/08/2021 Name:  Ellen Collins MRN:  [id number] DOB:  07-Jul-1951  Summary: LDL not at goal < 100   Recommendations/Changes made from today's visit: -Recommend repeat lipid panel -Based on ASCVD risk of 8.2% and LDL > 100, recommended CAC scoring to determine recommendation for statin therapy -Recommended vitamin D supplementation between 1000-2000 units daily -Removed escitalopram from med list due to side effects   Plan: Scheduled CPE   Subjective: Ellen Collins is an 71 y.o. year old female who is a primary patient of Koberlein, Steele Berg, MD.  The CCM team was consulted for assistance with disease management and care coordination needs.    Engaged with patient by telephone for follow up visit in response to provider referral for pharmacy case management and/or care coordination services.   Consent to Services:  The patient was given information about Chronic Care Management services, agreed to services, and gave verbal consent prior to initiation of services.  Please see initial visit note for detailed documentation.   Patient Care Team: Caren Macadam, MD as PCP - General (Family Medicine) Martinique, Amy, MD as Consulting Physician (Dermatology) Janyth Contes, MD as Consulting Physician (Obstetrics and Gynecology) Viona Gilmore, [facility] as Pharmacist (Pharmacist)  Recent office visits: 01/06/21 Launa Grill, LPN: Patient presented for Medicare Wellness exam  09/30/20 Caren Macadam, MD: Patient presented for complete physical. Prescribed Escitalopram Oxalate 5 mg Oral Daily. Removed nitrofurantoin 100 mg and prednisone 20 mg from medication list.  Recent consult visits: 10/31/21 E visit: Patient presented for visit for acute viral sinusitis. Recommended Mucinex and saline nasal spray.  10/20/21 Hyman Hopes, MD (Ophthalmology): Patient presented for concerns for cataracts. Plan to continue to  monitor.  11/30/19 Leta Baptist (Otolaryngology): Patient presented for follow-up for Sensorineural hearing loss, bilateral.  Hospital visits: None in previous 6 months   Objective:  Lab Results  Component Value Date   CREATININE 0.83 04/01/2020   BUN 12 04/01/2020   GFR 68.50 04/01/2020   GFRNONAA >60 05/26/2017   GFRAA >60 05/26/2017   NA 136 04/01/2020   K 4.3 04/01/2020   CALCIUM 9.4 04/01/2020   CO2 29 04/01/2020   GLUCOSE 115 (H) 04/01/2020    Lab Results  Component Value Date/Time   HGBA1C 6.1 04/01/2020 09:55 AM   HGBA1C 6.2 09/25/2019 09:28 AM   GFR 68.50 04/01/2020 09:55 AM   GFR 67.67 09/25/2019 09:28 AM    Last diabetic Eye exam: No results found for: HMDIABEYEEXA  Last diabetic Foot exam: No results found for: HMDIABFOOTEX   Lab Results  Component Value Date   CHOL 228 (H) 04/01/2020   HDL 64.50 04/01/2020   LDLCALC 124 (H) 04/01/2020   LDLDIRECT 126.0 09/25/2019   TRIG 198.0 (H) 04/01/2020   CHOLHDL 4 04/01/2020    Hepatic Function Latest Ref Rng & Units 04/01/2020 09/25/2019 10/24/2018  Total Protein 6.0 - 8.3 g/dL 7.2 7.2 6.9  Albumin 3.5 - 5.2 g/dL 4.5 4.5 4.4  AST 0 - 37 U/L '18 16 16  ' ALT 0 - 35 U/L '18 18 20  ' Alk Phosphatase 39 - 117 U/L 50 49 44  Total Bilirubin 0.2 - 1.2 mg/dL 0.6 0.6 0.5  Bilirubin, Direct 0.1 - 0.5 mg/dL - - -    Lab Results  Component Value Date/Time   TSH 1.68 09/25/2019 09:28 AM   TSH 1.31 10/24/2018 10:15 AM    CBC Latest Ref Rng & Units 04/01/2020 09/25/2019 10/24/2018  WBC 4.0 - 10.5 K/uL 5.1 7.0 5.8  Hemoglobin 12.0 - 15.0 g/dL 13.5 13.3 13.7  Hematocrit 36.0 - 46.0 % 39.9 39.0 40.4  Platelets 150.0 - 400.0 K/uL 297.0 319.0 307.0    No results found for: VD25OH  Clinical ASCVD: No  8.2% per risk calculator with last BP The 10-year ASCVD risk score (Arnett DK, et al., 2019) is: 12.1%   Values used to calculate the score:     Age: 71 years     Sex: Female     Is Non-Hispanic African American: No      Diabetic: No     Tobacco smoker: No     Systolic Blood Pressure: 562 mmHg     Is BP treated: No     HDL Cholesterol: 64.5 mg/dL     Total Cholesterol: 228 mg/dL    Depression screen [facility] 2/9 01/06/2021 09/30/2020  Decreased Interest 0 0  Down, Depressed, Hopeless 0 0  PHQ - 2 Score 0 0     Social History   Tobacco Use  Smoking Status Never  Smokeless Tobacco Never   BP Readings from Last 3 Encounters:  09/30/20 132/82  06/24/20 130/78  04/01/20 (!) 142/76   Pulse Readings from Last 3 Encounters:  09/30/20 91  06/24/20 100  04/01/20 (!) 102   Wt Readings from Last 3 Encounters:  09/30/20 168 lb 12.8 oz (76.6 kg)  06/24/20 165 lb 14.4 oz (75.3 kg)  04/01/20 163 lb (73.9 kg)   BMI Readings from Last 3 Encounters:  09/30/20 27.25 kg/m  06/24/20 26.78 kg/m  04/01/20 26.31 kg/m    Assessment/Interventions: Review of patient past medical history, allergies, medications, health status, including review of consultants reports, laboratory and other test data, was performed as part of comprehensive evaluation and provision of chronic care management services.   SDOH:  (Social Determinants of Health) assessments and interventions performed: Yes   SDOH Screenings   Alcohol Screen: Low Risk    Last Alcohol Screening Score (AUDIT): 3  Depression (PHQ2-9): Low Risk    PHQ-2 Score: 0  Financial Resource Strain: Low Risk    Difficulty of Paying Living Expenses: Not hard at all  Food Insecurity: No Food Insecurity   Worried About Charity fundraiser in the Last Year: Never true   Ran Out of Food in the Last Year: Never true  Housing: Low Risk    Last Housing Risk Score: 0  Physical Activity: Inactive   Days of Exercise per Week: 0 days   Minutes of Exercise per Session: 0 min  Social Connections: Engineer, building services of Communication with Friends and Family: More than three times a week   Frequency of Social Gatherings with Friends and Family: Twice a week    Attends Religious Services: 1 to 4 times per year   Active Member of [employer] or Organizations: Yes   Attends Archivist Meetings: 1 to 4 times per year   Marital Status: Married  Stress: No Stress Concern Present   Feeling of Stress : Not at all  Tobacco Use: Low Risk    Smoking Tobacco Use: Never   Smokeless Tobacco Use: Never   Passive Exposure: Not on file  Transportation Needs: No Transportation Needs   Lack of Transportation (Medical): No   Lack of Transportation (Non-Medical): No     CCM Care Plan  Allergies  Allergen Reactions   Latex     Itching, and redness    Medications Reviewed Today  Reviewed by Viona Gilmore, [place] (Pharmacist) on 12/08/21 at 58  Med List Status: <None>   Medication Order Taking? Sig Documenting Provider Last Dose Status Informant  0.9 %  sodium chloride infusion [id number]   Gatha Mayer, MD  Active   ALPRAZolam Duanne Moron) 0.5 MG tablet [id number] No Take 0.5-1 tablets (0.25-0.5 mg total) by mouth as needed for anxiety.  Patient not taking: Reported on 12/08/2021   Caren Macadam, MD Not Taking Active   azelastine (ASTELIN) 0.1 % nasal spray [id number]  Place 2 sprays into both nostrils 2 (two) times daily. Use in each nostril as directed Carvel Getting, NP  Active   betamethasone valerate lotion (VALISONE) 0.1 % [id number]  Apply 1 application topically 2 (two) times daily. Caren Macadam, MD  Active   estradiol (ESTRACE) 1 MG tablet [id number]  TAKE 1 TABLET BY MOUTH  DAILY Koberlein, Junell C, MD  Active   fluticasone (FLONASE) 50 MCG/ACT nasal spray [id number]  Place 2 sprays into both nostrils daily. Carvel Getting, NP  Active   loratadine (CLARITIN) 10 MG tablet [id number]  Take 5 mg by mouth as needed. allergies [provider]  Active Self  Multiple Vitamins-Minerals (MULTIVITAMIN WITH MINERALS) tablet [id number]  Take 1 tablet by mouth daily. [provider]  Active Self  Baruch Gouty [id number]   Viviscal hair vitamin-Take one daily [provider]  Active Self  Omega-3 Fatty Acids (FISH OIL) 1000 MG CAPS [id number]  Take 1 capsule by mouth daily. Take 350 mg daily [provider]  Active Self  traZODone (DESYREL) 50 MG tablet [id number] No TAKE 1 TABLET BY MOUTH EVERYDAY AT BEDTIME  Patient not taking: Reported on 12/08/2021   Caren Macadam, MD Not Taking Active   Turmeric 500 MG TABS [id number]  Take 500 mg by mouth daily. Caren Macadam, MD  Active   vitamin C (ASCORBIC ACID) 500 MG tablet [id number]  Take 500 mg by mouth daily. [provider]  Active   zolpidem (AMBIEN) 5 MG tablet [id number] Yes Take 1 tablet (5 mg total) by mouth at bedtime as needed for sleep. Caren Macadam, MD Taking Active             Patient Active Problem List   Diagnosis Date Noted   Basal cell carcinoma 10/24/2018   Mixed hyperlipidemia 11/13/2016   Insomnia 11/13/2016   Lumbar disc disease with radiculopathy 08/14/2014   Back pain 04/12/2013   NECK PAIN 01/09/2010   PANIC DISORDER 07/19/2009   DYSPHAGIA 06/17/2009   Anxiety state 11/16/2008   Allergic rhinitis 11/16/2008    Immunization History  Administered Date(s) Administered   Influenza Split 08/02/2012   Influenza, High Dose Seasonal PF 07/29/2018   Influenza,inj,Quad PF,6+ Mos 07/20/2013, 08/13/2014, 08/16/2015, 08/12/2016, 08/10/2017   Influenza-Unspecified 07/08/2019, 07/19/2020, 08/01/2021   PFIZER(Purple Top)SARS-COV-2 Vaccination 12/14/2019, 01/08/2020, 08/05/2020   Pfizer Covid-19 Vaccine Bivalent Booster 75yr & up 08/01/2021   Pneumococcal Polysaccharide-23 07/08/2019   Td 11/09/2005   Tdap 07/17/2019   Zoster Recombinat (Shingrix) 04/29/2020, 07/19/2020   Zoster, Live 04/17/2013   Patient reports she plans on restarting the trazodone 1 full tablet to see if this helps with sleep. She feels like the Ambien really helps and doesn't want to give this up but only takes 1/2 tablet now.  Patient does not use Xanax that often either and cannot recall the last time she took it.   Conditions to be addressed/monitored:  Hyperlipidemia, GERD, Anxiety, Allergic Rhinitis, and Insomnia,  Postmenopausal symptoms  Conditions addressed this visit: Anxiety and insomnia  Care Plan : CCM Pharmacy Care Plan  Updates made by Viona Gilmore, [place] since 12/08/2021 12:00 AM     Problem: Problem: Hyperlipidemia, GERD, Anxiety, Allergic Rhinitis, and Insomnia, Postmenopausal symptoms      Long-Range Goal: Patient-Specific Goal   Start Date: 06/03/2021  Expected End Date: 06/03/2022  Recent Progress: On track  Priority: High  Note:   Current Barriers:  Unable to independently monitor therapeutic efficacy  Pharmacist Clinical Goal(s):  Patient will achieve adherence to monitoring guidelines and medication adherence to achieve therapeutic efficacy through collaboration with PharmD and provider.   Interventions: 1:1 collaboration with Caren Macadam, MD regarding development and update of comprehensive plan of care as evidenced by provider attestation and co-signature Inter-disciplinary care team collaboration (see longitudinal plan of care) Comprehensive medication review performed; medication list updated in electronic medical record  Hyperlipidemia: (LDL goal < 100) -Uncontrolled -Current treatment: Fish oil 1000 mg 1 capsule twice daily - Query Appropriate, Query effective, Safe, Accessible -Medications previously tried: none  -Current dietary patterns: eats leaner meats; eating salads and lots of vegetables -Current exercise habits: active throughout the day -Educated on Cholesterol goals;  Importance of limiting foods high in cholesterol; Exercise goal of 150 minutes per week; -Counseled on diet and exercise extensively Recommended to continue current medication Recommended repeat lipid panel  Anxiety (Goal: minimize symptoms) -Not ideally controlled -Current  treatment: Alprazolam 0.5 mg 1/2-1 tablet as needed - Appropriate, Effective, Safe, Accessible -Medications previously tried/failed: escitalopram (side effects) -GAD7: n/a -Educated on Benefits of medication for symptom control -Recommended to continue current medication  Insomnia (Goal: improve quality and quantity of sleep) -Not ideally controlled -Current treatment  Zolpidem 5 mg 1 tablet at bedtime as needed (taking 1/2 tablet every night) - Appropriate, Effective, Query Safe, Accessible Trazodone 50 mg 1 tablet at bedtime - not taking -Medications previously tried: trazodone (ineffective)  -Counseled on practicing good sleep hygiene by setting a sleep schedule and maintaining it, avoid excessive napping, following a nightly routine, avoiding screen time for 30-60 minutes before going to bed, and making the bedroom a cool, quiet and dark space  Allergic rhinitis (Goal: minimize symptoms) -Controlled -Current treatment  Fluticasone nasal spray 1 spray in both nostrils daily - Appropriate, Effective, Safe, Accessible Azelastine nasal spray as needed - Appropriate, Effective, Safe, Accessible Zyrtec 5 mg daily - Appropriate, Effective, Safe, Accessible -Medications previously tried: claritin (alternates every few months with Zyrtec)  -Recommended to continue current medication  GERD/dysphagia (Goal: minimize symptoms) -Controlled -Current treatment  Famotidine 20 mg 1 tablet daily - Appropriate, Effective, Safe, Accessible -Medications previously tried: none  -Recommended to continue current medication Patient denies any problems with heartburn or swallowing.  Post menopausal symptoms (Goal: minimize symptoms) -Controlled -Current treatment  Estradiol 1 mg 1 tablet daily - Appropriate, Effective, Query Safe, Accessible -Medications previously tried: none  -Counseled on risks of long term estrogen therapy and patient is aware and does not want to discontinue.    Health  Maintenance -Vaccine gaps: Prevnar -Current therapy:  Multivitamin 1 tablet daily Turmeric 500 mg 1 tablet daily Hair vitamin daily Vitamin D 5000 units 1 tablet daily -Educated on Cost vs benefit of each product must be carefully weighed by individual consumer -Patient is satisfied with current therapy and denies issues -Counseled on limiting supplementation of vitamin D to 1000 to 2000 units daily.  Patient Goals/Self-Care Activities Patient will:  - take medications as prescribed target a  minimum of 150 minutes of moderate intensity exercise weekly engage in dietary modifications by increasing fiber intake to lower cholesterol  Follow Up Plan: Telephone follow up appointment with care management team member scheduled for: 1 year      Medication Assistance: None required.  Patient affirms current coverage meets needs.  Compliance/Adherence/Medication fill history: Care Gaps: Prevnar 20 Last BP - 132/82 on 09/30/2020  Star-Rating Drugs: None  Patient's preferred pharmacy is:  [id number] [place] [facility] [address] [place]Phone: [phone]: [phone] [facility] [address] [place] [place]Phone: [phone]: [address] [place] [place]Phone: [phone]: [phone] [facility] [address] [place] [place]Phone: [phone]: [phone]  Uses pill box? Yes - 2 weeks Pt endorses 100% compliance  We discussed: Current pharmacy is preferred with insurance plan and patient is satisfied with pharmacy services Patient decided to: Continue current medication management  strategy  Care Plan and Follow Up Patient Decision:  Patient agrees to Care Plan and Follow-up.  Plan: Telephone follow up appointment with care management team member scheduled for:  1 year  MJeni Salles PharmD, BAugustaPharmacist LOccidental Pet[phone]

## 2021-12-08 NOTE — Patient Instructions (Addendum)
Hi Pam,  It was great to catch up with you again! Don't forget to get your pneumonia shot (Prevnar 20) when you can!  Please reach out to me if you have any questions or need anything before our follow up!  Best, Maddie  [name], PharmD, [place] Pharmacist [place] [facility] [place]   Visit Information   Goals Addressed   None    Patient Care Plan: CCM Pharmacy Care Plan     Problem Identified: Problem: Hyperlipidemia, GERD, Anxiety, Allergic Rhinitis, and Insomnia, Postmenopausal symptoms      Long-Range Goal: Patient-Specific Goal   Start Date: 06/03/2021  Expected End Date: 06/03/2022  Recent Progress: On track  Priority: High  Note:   Current Barriers:  Unable to independently monitor therapeutic efficacy  Pharmacist Clinical Goal(s):  Patient will achieve adherence to monitoring guidelines and medication adherence to achieve therapeutic efficacy through collaboration with PharmD and provider.   Interventions: 1:1 collaboration with Caren Macadam, MD regarding development and update of comprehensive plan of care as evidenced by provider attestation and co-signature Inter-disciplinary care team collaboration (see longitudinal plan of care) Comprehensive medication review performed; medication list updated in electronic medical record  Hyperlipidemia: (LDL goal < 100) -Uncontrolled -Current treatment: Fish oil 1000 mg 1 capsule twice daily - Query Appropriate, Query effective, Safe, Accessible -Medications previously tried: none  -Current dietary patterns: eats leaner meats; eating salads and lots of vegetables -Current exercise habits: active throughout the day -Educated on Cholesterol goals;  Importance of limiting foods high in cholesterol; Exercise goal of 150 minutes per week; -Counseled on diet and exercise extensively Recommended to continue current medication Recommended repeat lipid panel  Anxiety (Goal: minimize  symptoms) -Not ideally controlled -Current treatment: Alprazolam 0.5 mg 1/2-1 tablet as needed - Appropriate, Effective, Safe, Accessible -Medications previously tried/failed: escitalopram (side effects) -GAD7: n/a -Educated on Benefits of medication for symptom control -Recommended to continue current medication  Insomnia (Goal: improve quality and quantity of sleep) -Not ideally controlled -Current treatment  Zolpidem 5 mg 1 tablet at bedtime as needed (taking 1/2 tablet every night) - Appropriate, Effective, Query Safe, Accessible Trazodone 50 mg 1 tablet at bedtime - not taking -Medications previously tried: trazodone (ineffective)  -Counseled on practicing good sleep hygiene by setting a sleep schedule and maintaining it, avoid excessive napping, following a nightly routine, avoiding screen time for 30-60 minutes before going to bed, and making the bedroom a cool, quiet and dark space  Allergic rhinitis (Goal: minimize symptoms) -Controlled -Current treatment  Fluticasone nasal spray 1 spray in both nostrils daily - Appropriate, Effective, Safe, Accessible Azelastine nasal spray as needed - Appropriate, Effective, Safe, Accessible Zyrtec 5 mg daily - Appropriate, Effective, Safe, Accessible -Medications previously tried: claritin (alternates every few months with Zyrtec)  -Recommended to continue current medication  GERD/dysphagia (Goal: minimize symptoms) -Controlled -Current treatment  Famotidine 20 mg 1 tablet daily - Appropriate, Effective, Safe, Accessible -Medications previously tried: none  -Recommended to continue current medication Patient denies any problems with heartburn or swallowing.  Post menopausal symptoms (Goal: minimize symptoms) -Controlled -Current treatment  Estradiol 1 mg 1 tablet daily - Appropriate, Effective, Query Safe, Accessible -Medications previously tried: none  -Counseled on risks of long term estrogen therapy and patient is aware and does  not want to discontinue.    Health Maintenance -Vaccine gaps: Prevnar -Current therapy:  Multivitamin 1 tablet daily Turmeric 500 mg 1 tablet daily Hair vitamin daily Vitamin D 5000 units 1 tablet daily -Educated on  Cost vs benefit of each product must be carefully weighed by individual consumer -Patient is satisfied with current therapy and denies issues -Counseled on limiting supplementation of vitamin D to 1000 to 2000 units daily.  Patient Goals/Self-Care Activities Patient will:  - take medications as prescribed target a minimum of 150 minutes of moderate intensity exercise weekly engage in dietary modifications by increasing fiber intake to lower cholesterol  Follow Up Plan: Telephone follow up appointment with care management team member scheduled for: 1 year       Patient verbalizes understanding of instructions and care plan provided today and agrees to view in [place]. Active MyChart status confirmed with patient.   Telephone follow up appointment with pharmacy team member scheduled for: 1 year  Viona Gilmore, [facility]

## 2021-12-09 DIAGNOSIS — E782 Mixed hyperlipidemia: Secondary | ICD-10-CM | POA: Diagnosis not present

## 2021-12-09 DIAGNOSIS — F411 Generalized anxiety disorder: Secondary | ICD-10-CM | POA: Diagnosis not present

## 2021-12-19 DIAGNOSIS — M48062 Spinal stenosis, lumbar region with neurogenic claudication: Secondary | ICD-10-CM | POA: Diagnosis not present

## 2021-12-31 ENCOUNTER — Other Ambulatory Visit: Payer: Self-pay | Admitting: Family Medicine

## 2021-12-31 DIAGNOSIS — G4700 Insomnia, unspecified: Secondary | ICD-10-CM

## 2022-01-05 DIAGNOSIS — D2272 Melanocytic nevi of left lower limb, including hip: Secondary | ICD-10-CM | POA: Diagnosis not present

## 2022-01-05 DIAGNOSIS — D2271 Melanocytic nevi of right lower limb, including hip: Secondary | ICD-10-CM | POA: Diagnosis not present

## 2022-01-05 DIAGNOSIS — L821 Other seborrheic keratosis: Secondary | ICD-10-CM | POA: Diagnosis not present

## 2022-01-05 DIAGNOSIS — L814 Other melanin hyperpigmentation: Secondary | ICD-10-CM | POA: Diagnosis not present

## 2022-01-05 DIAGNOSIS — I788 Other diseases of capillaries: Secondary | ICD-10-CM | POA: Diagnosis not present

## 2022-01-05 DIAGNOSIS — L820 Inflamed seborrheic keratosis: Secondary | ICD-10-CM | POA: Diagnosis not present

## 2022-01-05 DIAGNOSIS — Z85828 Personal history of other malignant neoplasm of skin: Secondary | ICD-10-CM | POA: Diagnosis not present

## 2022-01-10 ENCOUNTER — Other Ambulatory Visit: Payer: Self-pay

## 2022-01-10 ENCOUNTER — Encounter (HOSPITAL_COMMUNITY): Payer: Self-pay | Admitting: Emergency Medicine

## 2022-01-10 ENCOUNTER — Emergency Department (HOSPITAL_COMMUNITY)
Admission: EM | Admit: 2022-01-10 | Discharge: 2022-01-10 | Disposition: A | Discharge status: Home / Self Care | Payer: Medicare Other | Attending: Emergency Medicine | Admitting: Emergency Medicine

## 2022-01-10 ENCOUNTER — Emergency Department (HOSPITAL_COMMUNITY): Payer: Medicare Other

## 2022-01-10 DIAGNOSIS — Z7951 Long term (current) use of inhaled steroids: Secondary | ICD-10-CM | POA: Insufficient documentation

## 2022-01-10 DIAGNOSIS — R112 Nausea with vomiting, unspecified: Secondary | ICD-10-CM | POA: Diagnosis not present

## 2022-01-10 DIAGNOSIS — Z20822 Contact with and (suspected) exposure to covid-19: Secondary | ICD-10-CM | POA: Insufficient documentation

## 2022-01-10 DIAGNOSIS — R519 Headache, unspecified: Secondary | ICD-10-CM | POA: Insufficient documentation

## 2022-01-10 DIAGNOSIS — G44209 Tension-type headache, unspecified, not intractable: Secondary | ICD-10-CM | POA: Diagnosis not present

## 2022-01-10 LAB — CBC WITH DIFFERENTIAL/PLATELET
Abs Immature Granulocytes: 0.02 10*3/uL (ref 0.00–0.07)
Basophils Absolute: 0 10*3/uL (ref 0.0–0.1)
Basophils Relative: 0 %
Eosinophils Absolute: 0 10*3/uL (ref 0.0–0.5)
Eosinophils Relative: 1 %
HCT: 40.6 % (ref 36.0–46.0)
Hemoglobin: 13.8 g/dL (ref 12.0–15.0)
Immature Granulocytes: 0 %
Lymphocytes Relative: 27 %
Lymphs Abs: 1.9 10*3/uL (ref 0.7–4.0)
MCH: 32.1 pg (ref 26.0–34.0)
MCHC: 34 g/dL (ref 30.0–36.0)
MCV: 94.4 fL (ref 80.0–100.0)
Monocytes Absolute: 0.5 10*3/uL (ref 0.1–1.0)
Monocytes Relative: 7 %
Neutro Abs: 4.5 10*3/uL (ref 1.7–7.7)
Neutrophils Relative %: 65 %
Platelets: 307 10*3/uL (ref 150–400)
RBC: 4.3 MIL/uL (ref 3.87–5.11)
RDW: 12.3 % (ref 11.5–15.5)
WBC: 7 10*3/uL (ref 4.0–10.5)
nRBC: 0 % (ref 0.0–0.2)

## 2022-01-10 LAB — COMPREHENSIVE METABOLIC PANEL
ALT: 20 U/L (ref 0–44)
AST: 16 U/L (ref 15–41)
Albumin: 4.1 g/dL (ref 3.5–5.0)
Alkaline Phosphatase: 41 U/L (ref 38–126)
Anion gap: 9 (ref 5–15)
BUN: 12 mg/dL (ref 8–23)
CO2: 26 mmol/L (ref 22–32)
Calcium: 8.8 mg/dL — ABNORMAL LOW (ref 8.9–10.3)
Chloride: 98 mmol/L (ref 98–111)
Creatinine, Ser: 0.81 mg/dL (ref 0.44–1.00)
GFR, Estimated: >60 mL/min (ref >60)
Glucose, Bld: 139 mg/dL — ABNORMAL HIGH (ref 70–99)
Potassium: 3.5 mmol/L (ref 3.5–5.1)
Sodium: 133 mmol/L — ABNORMAL LOW (ref 135–145)
Total Bilirubin: 0.7 mg/dL (ref 0.3–1.2)
Total Protein: 7.4 g/dL (ref 6.5–8.1)

## 2022-01-10 LAB — RESP PANEL BY RT-PCR (FLU A&B, COVID) ARPGX2
Influenza A by PCR: NEGATIVE
Influenza B by PCR: NEGATIVE
SARS Coronavirus 2 by RT PCR: NEGATIVE

## 2022-01-10 MED ORDER — SODIUM CHLORIDE 0.9 % IV BOLUS
1000.0000 mL | Freq: Once | INTRAVENOUS | Status: AC
  Administered 2022-01-10: 1000 mL via INTRAVENOUS

## 2022-01-10 MED ORDER — KETOROLAC TROMETHAMINE 30 MG/ML IJ SOLN
15.0000 mg | Freq: Once | INTRAMUSCULAR | Status: AC
  Administered 2022-01-10: 15 mg via INTRAVENOUS
  Filled 2022-01-10: qty 1

## 2022-01-10 MED ORDER — ONDANSETRON 4 MG PO TBDP
4.0000 mg | ORAL_TABLET | Freq: Three times a day (TID) | ORAL | 0 refills | Status: DC | PRN
Start: 2022-01-10 — End: 2022-03-02

## 2022-01-10 MED ORDER — PROCHLORPERAZINE EDISYLATE 10 MG/2ML IJ SOLN
10.0000 mg | Freq: Once | INTRAMUSCULAR | Status: AC
  Administered 2022-01-10: 10 mg via INTRAVENOUS
  Filled 2022-01-10: qty 2

## 2022-01-10 MED ORDER — DIPHENHYDRAMINE HCL 50 MG/ML IJ SOLN
25.0000 mg | Freq: Once | INTRAMUSCULAR | Status: AC
  Administered 2022-01-10: 25 mg via INTRAVENOUS
  Filled 2022-01-10: qty 1

## 2022-01-10 NOTE — Discharge Instructions (Signed)
As we discussed, your work-up in the ER was reassuring for acute abnormalities.  I suspect that your headache is stress related.  I recommend manage with rest and Tylenol/ibuprofen as needed for pain.  I have also given you a prescription for Zofran which is a dissolvable tablet that you can stick under your tongue to help with nausea.  Follow-up with your primary care doctor in the next few days for continued evaluation and management. ? ?Return if development of any new or worsening symptoms. ?

## 2022-01-10 NOTE — ED Provider Notes (Signed)
[facility] DEPT Provider Note   CSN: [id number] Arrival date & time: 01/10/22  1824     History  No chief complaint on file.   Ellen Collins is a 71 y.o. female.  Patient with no pertinent past medical history presents today with complaints of headache. She states that same came on gradually on Thursday and has been worsening since. She states that she does have some chronic sinus issues that give her headaches occasionally but she states that today feels completely different and much worse than normal. Pain is located mostly in the frontal region of her head and behind her eyes. She also endorses associated photophobia and sensitivity to sound. She also endorses associated nausea and vomiting that has been present since the onset of her headache Thursday. She has not been able to keep any food, drink, or medications since then. Also states that today she started having some abdominal pain as well but she suspects the pain is due to several days of vomiting. She denies any fevers, chills, vision changes, dizziness, lightheadedness, chest pain, or shortness of breath.   The history is provided by the patient. No language interpreter was used.      Home Medications Prior to Admission medications   Medication Sig Start Date End Date Taking? Authorizing Provider  ALPRAZolam Duanne Moron) 0.5 MG tablet Take 0.5-1 tablets (0.25-0.5 mg total) by mouth as needed for anxiety. 09/30/20   Koberlein, Steele Berg, MD  azelastine (ASTELIN) 0.1 % nasal spray Place 2 sprays into both nostrils 2 (two) times daily. Use in each nostril as directed 10/31/21   Carvel Getting, NP  betamethasone valerate lotion (VALISONE) 0.1 % Apply 1 application topically 2 (two) times daily. 04/02/20   Koberlein, Steele Berg, MD  estradiol (ESTRACE) 1 MG tablet TAKE 1 TABLET BY MOUTH  DAILY 05/11/21   Koberlein, Steele Berg, MD  fluticasone (FLONASE) 50 MCG/ACT nasal spray Place 2 sprays into both nostrils  daily. 10/31/21   Carvel Getting, NP  loratadine (CLARITIN) 10 MG tablet Take 5 mg by mouth as needed. allergies    [provider]  Multiple Vitamins-Minerals (MULTIVITAMIN WITH MINERALS) tablet Take 1 tablet by mouth daily.    [provider]  NON FORMULARY Viviscal hair vitamin-Take one daily    [provider]  Omega-3 Fatty Acids (FISH OIL) 1000 MG CAPS Take 1 capsule by mouth daily. Take 350 mg daily    [provider]  traZODone (DESYREL) 50 MG tablet TAKE 1 TABLET BY MOUTH EVERYDAY AT BEDTIME 12/31/21   Koberlein, Junell C, MD  Turmeric 500 MG TABS Take 500 mg by mouth daily. 10/24/18   Caren Macadam, MD  vitamin C (ASCORBIC ACID) 500 MG tablet Take 500 mg by mouth daily.    [provider]  zolpidem (AMBIEN) 5 MG tablet Take 1 tablet (5 mg total) by mouth at bedtime as needed for sleep. 04/10/20   Caren Macadam, MD      Allergies    Latex    Review of Systems   Review of Systems  Constitutional:  Negative for chills and fever.  Eyes:  Positive for photophobia. Negative for visual disturbance.  Respiratory:  Negative for shortness of breath.   Cardiovascular:  Negative for chest pain.  Gastrointestinal:  Positive for abdominal pain, nausea and vomiting.  Genitourinary:  Negative for dysuria.  Neurological:  Positive for headaches. Negative for dizziness, tremors, seizures, syncope, facial asymmetry, speech difficulty, weakness, light-headedness and numbness.  Psychiatric/Behavioral:  Negative for confusion and decreased concentration.   All other systems reviewed and are negative.  Physical Exam Updated Vital Signs BP (!) 152/96    Pulse (!) 102    Temp 98.5 F (36.9 C) (Oral)    Resp 18    Ht '5\' 6"'$  (1.676 m)    Wt 76.6 kg    SpO2 95%    BMI 27.26 kg/m  Physical Exam Vitals and nursing note reviewed.  Constitutional:      General: She is not in acute distress.    Appearance: Normal appearance. She is normal weight. She  is not ill-appearing, toxic-appearing or diaphoretic.     Comments: Patient resting comfortably in bed in no acute distress  HENT:     Head: Normocephalic and atraumatic.  Eyes:     Extraocular Movements: Extraocular movements intact.     Pupils: Pupils are equal, round, and reactive to light.  Cardiovascular:     Rate and Rhythm: Normal rate and regular rhythm.     Heart sounds: Normal heart sounds.  Pulmonary:     Effort: Pulmonary effort is normal. No respiratory distress.     Breath sounds: Normal breath sounds.  Abdominal:     General: Abdomen is flat.     Palpations: Abdomen is soft.     Tenderness: There is no abdominal tenderness.  Musculoskeletal:        General: Normal range of motion.     Cervical back: Normal range of motion.  Skin:    General: Skin is warm and dry.  Neurological:     General: No focal deficit present.     Mental Status: She is alert and oriented to person, place, and time.     GCS: GCS eye subscore is 4. GCS verbal subscore is 5. GCS motor subscore is 6.     Sensory: Sensation is intact.     Motor: Motor function is intact.     Coordination: Coordination is intact.     Gait: Gait is intact.     Comments: Alert and oriented to self, place, time and event.    Speech is fluent, clear without dysarthria or dysphasia.    Strength 5/5 in upper/lower extremities   Sensation intact in upper/lower extremities    CN I not tested  CN II grossly intact visual fields bilaterally. Did not visualize posterior eye.  CN III, IV, VI PERRLA and EOMs intact bilaterally  CN V Intact sensation to sharp and light touch to the face  CN VII facial movements symmetric  CN VIII not tested  CN IX, X no uvula deviation, symmetric rise of soft palate  CN XI 5/5 SCM and trapezius strength bilaterally  CN XII Midline tongue protrusion, symmetric L/R movements   Psychiatric:        Mood and Affect: Mood normal.        Behavior: Behavior normal.    ED Results /  Procedures / Treatments   Labs (all labs ordered are listed, but only abnormal results are displayed) Labs Reviewed  COMPREHENSIVE METABOLIC PANEL - Abnormal; Notable for the following components:      Result Value   Sodium 133 (*)    Glucose, Bld 139 (*)    Calcium 8.8 (*)    All other components within normal limits  RESP PANEL BY RT-PCR (FLU A&B, COVID) ARPGX2  CBC WITH DIFFERENTIAL/PLATELET    EKG None  Radiology No results found.  Procedures Procedures    Medications Ordered in  ED Medications  prochlorperazine (COMPAZINE) injection 10 mg (10 mg Intravenous Given 01/10/22 1927)  diphenhydrAMINE (BENADRYL) injection 25 mg (25 mg Intravenous Given 01/10/22 1927)  sodium chloride 0.9 % bolus 1,000 mL (1,000 mLs Intravenous New Bag/Given 01/10/22 1927)    ED Course/ Medical Decision Making/ A&P                           Medical Decision Making Amount and/or Complexity of Data Reviewed Labs: ordered. Radiology: ordered.  Risk Prescription drug management.   This patient presents to the ED for concern of headache, nausea, vomiting, this involves an extensive number of treatment options, and is a complaint that carries with it a high risk of complications and morbidity.   Co morbidities that complicate the patient evaluation  none  Lab Tests:  I Ordered, and personally interpreted labs.  The pertinent results include:  mild hyponatremia at 133, no leukocytosis or anemia   Imaging Studies ordered:  I ordered imaging studies including CT head without contrast  I independently visualized and interpreted imaging which showed no acute intracranial abnormality I agree with the radiologist interpretation   Medicines ordered and prescription drug management:  I ordered medication including compazine, benadryl, and toradol and fluids for headache, nausea/vomiting, and dehydration  Reevaluation of the patient after these medicines showed that the patient improved I have  reviewed the patients home medicines and have made adjustments as needed  Upon reassessment after headache cocktail, patient states that her pain has significantly subsided.  She is also not had any episodes of vomiting since arriving here today.  She is able to drink water without emesis as well.  She is afebrile, nontoxic-appearing, in no acute distress with reassuring vital signs. She is also alert and oriented and neurologically intact. 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 She also states that she has not been sleeping due to stress associated with this.  In the presence of normal laboratory findings and normal CT scan with improvement after medications, I therefore suspect that patient's symptoms are related to tension headache.  Extremely low suspicion for missed SAH or other acute intracranial pathology. I have discussed this with the patient and she agrees.  She now feels well enough to go home.  I have discussed red flag symptoms that would prompt immediate return.  Discharged in stable condition.   This is a shared visit with supervising physician Dr. Eulis Foster who has independently evaluated patient & provided guidance in evaluation/management/disposition, in agreement with care    Final Clinical Impression(s) / ED Diagnoses Final diagnoses:  Acute non intractable tension-type headache  Nausea and vomiting, unspecified vomiting type    Rx / DC Orders ED Discharge Orders          Ordered    ondansetron (ZOFRAN-ODT) 4 MG disintegrating tablet  Every 8 hours PRN        01/10/22 2147          An After Visit Summary was printed and given to the patient.     Nestor Lewandowsky 01/10/22 2151    Daleen Bo, MD 01/10/22 2212

## 2022-01-10 NOTE — ED Provider Notes (Signed)
?  Face-to-face evaluation ? ? ?History: She presents for evaluation headache present for 3 days.  It is associated with nausea and vomiting.  She denies fever, chills, cough, shortness of breath, nasal congestion, rhinorrhea, cough or sneezing.  No known sick contacts.  No prior headaches.  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  She is here with her husband who is supportive. ? ?Physical exam: Elderly alert calm cooperative.  No dysarthria or aphasia.  She is able to sit up and flex her chin to her chest without pain.  Heart regular rate and rhythm without murmur, lungs clear to auscultation. ? ?MDM: Evaluation for No chief complaint on file. ?  ? ?Nonspecific headache with nausea vomiting.  CT head is normal.  Clinical exam is normal.  There are no focal neurologic abnormalities.  She has not had headaches like this previously.  She received a migraine cocktail initially with partial improvement.  Ketorolac was added, and she was reassessed. ? ?Medical screening examination/treatment/procedure(s) were conducted as a shared visit with non-physician practitioner(s) and myself.  I personally evaluated the patient during the encounter ? ?  ?Daleen Bo, MD ?01/10/22 2211 ? ?

## 2022-01-10 NOTE — ED Triage Notes (Signed)
Reports an ongoing HA since Thursday with N/V, unable to keep down food or water and some upper abd pain. States this is unlike any HA she has had. Frontal HA, involving both eyes.  ?

## 2022-01-10 NOTE — ED Notes (Signed)
Patient tolerated P.O. Challenge ?

## 2022-01-13 ENCOUNTER — Encounter: Payer: Self-pay | Admitting: Family Medicine

## 2022-01-17 ENCOUNTER — Other Ambulatory Visit: Payer: Self-pay

## 2022-01-17 ENCOUNTER — Emergency Department (HOSPITAL_COMMUNITY): Payer: Medicare Other

## 2022-01-17 ENCOUNTER — Emergency Department (HOSPITAL_COMMUNITY)
Admission: EM | Admit: 2022-01-17 | Discharge: 2022-01-17 | Disposition: A | Discharge status: Home / Self Care | Payer: Medicare Other | Attending: Emergency Medicine | Admitting: Emergency Medicine

## 2022-01-17 ENCOUNTER — Encounter (HOSPITAL_COMMUNITY): Payer: Self-pay

## 2022-01-17 DIAGNOSIS — N3289 Other specified disorders of bladder: Secondary | ICD-10-CM

## 2022-01-17 DIAGNOSIS — Z91040 Latex allergy status: Secondary | ICD-10-CM | POA: Insufficient documentation

## 2022-01-17 DIAGNOSIS — Z79899 Other long term (current) drug therapy: Secondary | ICD-10-CM | POA: Insufficient documentation

## 2022-01-17 DIAGNOSIS — R109 Unspecified abdominal pain: Secondary | ICD-10-CM | POA: Diagnosis not present

## 2022-01-17 DIAGNOSIS — K5900 Constipation, unspecified: Secondary | ICD-10-CM | POA: Insufficient documentation

## 2022-01-17 LAB — COMPREHENSIVE METABOLIC PANEL
ALT: 18 U/L (ref 0–44)
AST: 19 U/L (ref 15–41)
Albumin: 4.2 g/dL (ref 3.5–5.0)
Alkaline Phosphatase: 39 U/L (ref 38–126)
Anion gap: 9 (ref 5–15)
BUN: 12 mg/dL (ref 8–23)
CO2: 28 mmol/L (ref 22–32)
Calcium: 8.7 mg/dL — ABNORMAL LOW (ref 8.9–10.3)
Chloride: 97 mmol/L — ABNORMAL LOW (ref 98–111)
Creatinine, Ser: 0.71 mg/dL (ref 0.44–1.00)
GFR, Estimated: >60 mL/min (ref >60)
Glucose, Bld: 110 mg/dL — ABNORMAL HIGH (ref 70–99)
Potassium: 3.6 mmol/L (ref 3.5–5.1)
Sodium: 134 mmol/L — ABNORMAL LOW (ref 135–145)
Total Bilirubin: 0.8 mg/dL (ref 0.3–1.2)
Total Protein: 7.3 g/dL (ref 6.5–8.1)

## 2022-01-17 LAB — CBC WITH DIFFERENTIAL/PLATELET
Abs Immature Granulocytes: 0.03 10*3/uL (ref 0.00–0.07)
Basophils Absolute: 0 10*3/uL (ref 0.0–0.1)
Basophils Relative: 0 %
Eosinophils Absolute: 0.2 10*3/uL (ref 0.0–0.5)
Eosinophils Relative: 2 %
HCT: 39.6 % (ref 36.0–46.0)
Hemoglobin: 13.3 g/dL (ref 12.0–15.0)
Immature Granulocytes: 0 %
Lymphocytes Relative: 24 %
Lymphs Abs: 2.1 10*3/uL (ref 0.7–4.0)
MCH: 31.8 pg (ref 26.0–34.0)
MCHC: 33.6 g/dL (ref 30.0–36.0)
MCV: 94.7 fL (ref 80.0–100.0)
Monocytes Absolute: 0.8 10*3/uL (ref 0.1–1.0)
Monocytes Relative: 9 %
Neutro Abs: 5.9 10*3/uL (ref 1.7–7.7)
Neutrophils Relative %: 65 %
Platelets: 321 10*3/uL (ref 150–400)
RBC: 4.18 MIL/uL (ref 3.87–5.11)
RDW: 12.1 % (ref 11.5–15.5)
WBC: 9 10*3/uL (ref 4.0–10.5)
nRBC: 0 % (ref 0.0–0.2)

## 2022-01-17 LAB — URINALYSIS, ROUTINE W REFLEX MICROSCOPIC
Bilirubin Urine: NEGATIVE
Glucose, UA: NEGATIVE mg/dL
Hgb urine dipstick: NEGATIVE
Ketones, ur: NEGATIVE mg/dL
Leukocytes,Ua: NEGATIVE
Nitrite: NEGATIVE
Protein, ur: NEGATIVE mg/dL
Specific Gravity, Urine: 1.006 (ref 1.005–1.030)
pH: 7 (ref 5.0–8.0)

## 2022-01-17 LAB — LIPASE, BLOOD: Lipase: 24 U/L (ref 11–51)

## 2022-01-17 MED ORDER — SODIUM CHLORIDE 0.9 % IV SOLN
Freq: Once | INTRAVENOUS | Status: AC
  Administered 2022-01-17: 100 mL via INTRAVENOUS

## 2022-01-17 MED ORDER — HYDROMORPHONE HCL 1 MG/ML IJ SOLN
0.5000 mg | Freq: Once | INTRAMUSCULAR | Status: AC
Start: 2022-01-17 — End: 2022-01-17
  Administered 2022-01-17: 0.5 mg via INTRAVENOUS
  Filled 2022-01-17: qty 1

## 2022-01-17 MED ORDER — ONDANSETRON HCL 4 MG/2ML IJ SOLN
INTRAMUSCULAR | Status: AC
  Administered 2022-01-17: 4 mg
  Filled 2022-01-17: qty 2

## 2022-01-17 MED ORDER — IOHEXOL 300 MG/ML  SOLN
100.0000 mL | Freq: Once | INTRAMUSCULAR | Status: AC | PRN
  Administered 2022-01-17: 100 mL via INTRAVENOUS

## 2022-01-17 NOTE — ED Triage Notes (Signed)
Pt arrived via POV, c/o constipation x9 days. States she has tried miralax, ducolax, multiple laxatives with no relief. C/o distention and abd pain. Also states she believes she is retaining urine.  ?

## 2022-01-17 NOTE — Discharge Instructions (Signed)
1.  Buy a bottle of magnesium citrate at the pharmacy.  This is an over-the-counter medication.  Drink half of the bottle in the morning.  If you have not had a large bowel movement, drink the remainder of the bottle in about 4 to 6 hours.  Take a dose of MiraLAX in the morning as well. ?2.  Start taking a stool softener such as Colace twice daily.  Take MiraLAX daily until stool is soft and easy to pass.  When stool is easy to pass, you may continue taking the Colace and only take the MiraLAX if you have not had a normal bowel movement every 2 to 4 days. ?3.  Your CT scan showed an area of possible concern on your bladder this needs further evaluation to make sure it is not an early bladder tumor.  Discuss this with your doctor and you will likely need referral to a urologist. ?4.  Return to the emergency department if you have fever chills worsening pain or other concerning symptoms. ?

## 2022-01-17 NOTE — ED Notes (Signed)
Setup for soap suds enema and disimpaction, MD made aware ?

## 2022-01-17 NOTE — ED Notes (Signed)
Patient on bedside commode. Patient states she has not had a BM yet. 300 cc soap sud enema instilled. Patient informed to hit call bell if she is able to have a BM. ?

## 2022-01-17 NOTE — ED Provider Notes (Signed)
[facility] DEPT Provider Note   CSN: [id number] Arrival date & time: 01/17/22  1516     History  Chief Complaint  Patient presents with   Constipation    JAELYN BOURGOIN is a 71 y.o. female.  HPI Patient was seen in the emergency department 7 days ago.  She was seen for a headache at that time.  Patient reports her headache resolved and she feels much better.  However, she reports she has now been constipated for 9 days.  Last bowel movement was 9 days ago.  She reports for about 3 or 4 days she has been getting constant lower abdominal pain.  She has pain in her lower back.  She has rectal pain she tries to strain at stool.  She has tried multiple over-the-counter preparations including laxatives, stool softeners.  Patient denies she had any fever.  She has not vomited.  She has had occasional constipation but nothing similar to this.    Home Medications Prior to Admission medications   Medication Sig Start Date End Date Taking? Authorizing Provider  ALPRAZolam Duanne Moron) 0.5 MG tablet Take 0.5-1 tablets (0.25-0.5 mg total) by mouth as needed for anxiety. 09/30/20   Koberlein, Steele Berg, MD  azelastine (ASTELIN) 0.1 % nasal spray Place 2 sprays into both nostrils 2 (two) times daily. Use in each nostril as directed 10/31/21   Carvel Getting, NP  betamethasone valerate lotion (VALISONE) 0.1 % Apply 1 application topically 2 (two) times daily. 04/02/20   Koberlein, Steele Berg, MD  estradiol (ESTRACE) 1 MG tablet TAKE 1 TABLET BY MOUTH  DAILY 05/11/21   Koberlein, Steele Berg, MD  fluticasone (FLONASE) 50 MCG/ACT nasal spray Place 2 sprays into both nostrils daily. 10/31/21   Carvel Getting, NP  loratadine (CLARITIN) 10 MG tablet Take 5 mg by mouth as needed. allergies    [provider]  Multiple Vitamins-Minerals (MULTIVITAMIN WITH MINERALS) tablet Take 1 tablet by mouth daily.    [provider]  NON FORMULARY Viviscal hair vitamin-Take one daily     [provider]  Omega-3 Fatty Acids (FISH OIL) 1000 MG CAPS Take 1 capsule by mouth daily. Take 350 mg daily    [provider]  ondansetron (ZOFRAN-ODT) 4 MG disintegrating tablet Take 1 tablet (4 mg total) by mouth every 8 (eight) hours as needed for nausea or vomiting. 01/10/22   Smoot, Sarah A, PA-C  traZODone (DESYREL) 50 MG tablet TAKE 1 TABLET BY MOUTH EVERYDAY AT BEDTIME 12/31/21   Koberlein, Junell C, MD  Turmeric 500 MG TABS Take 500 mg by mouth daily. 10/24/18   Caren Macadam, MD  vitamin C (ASCORBIC ACID) 500 MG tablet Take 500 mg by mouth daily.    [provider]  zolpidem (AMBIEN) 5 MG tablet Take 1 tablet (5 mg total) by mouth at bedtime as needed for sleep. 04/10/20   Caren Macadam, MD      Allergies    Latex    Review of Systems   Review of Systems 10 systems reviewed negative except as per HPI Physical Exam Updated Vital Signs BP 108/60    Pulse 77    Temp 97.8 F (36.6 C) (Oral)    Resp 16    Ht '5\' 6"'$  (1.676 m)    Wt 77 kg    SpO2 90%    BMI 27.40 kg/m  Physical Exam Constitutional:      Comments: Patient is alert nontoxic.  Mental status clear.  No respiratory distress.  She does appear uncomfortable.  HENT:     Head: Normocephalic and atraumatic.     Mouth/Throat:     Pharynx: Oropharynx is clear.  Eyes:     Extraocular Movements: Extraocular movements intact.  Cardiovascular:     Rate and Rhythm: Normal rate and regular rhythm.  Pulmonary:     Effort: Pulmonary effort is normal.     Breath sounds: Normal breath sounds.  Abdominal:     Comments: Abdomen soft.  Diffuse lower abdominal pain to palpation.  Genitourinary:    Comments: Hard brown stool in the vault.  Consistent with impaction.  No blood. Musculoskeletal:        General: No swelling. Normal range of motion.  Skin:    General: Skin is warm and dry.  Neurological:     General: No focal deficit present.     Mental Status: She is oriented to person, place,  and time.     Coordination: Coordination normal.  Psychiatric:        Mood and Affect: Mood normal.    ED Results / Procedures / Treatments   Labs (all labs ordered are listed, but only abnormal results are displayed) Labs Reviewed  COMPREHENSIVE METABOLIC PANEL - Abnormal; Notable for the following components:      Result Value   Sodium 134 (*)    Chloride 97 (*)    Glucose, Bld 110 (*)    Calcium 8.7 (*)    All other components within normal limits  URINALYSIS, ROUTINE W REFLEX MICROSCOPIC - Abnormal; Notable for the following components:   Bacteria, UA FEW (*)    All other components within normal limits  LIPASE, BLOOD  CBC WITH DIFFERENTIAL/PLATELET    EKG None  Radiology CT Abdomen Pelvis W Contrast  Result Date: 01/17/2022 CLINICAL DATA:  Acute abdominal pain. EXAM: CT ABDOMEN AND PELVIS WITH CONTRAST TECHNIQUE: Multidetector CT imaging of the abdomen and pelvis was performed using the standard protocol following bolus administration of intravenous contrast. RADIATION DOSE REDUCTION: This exam was performed according to the departmental dose-optimization program which includes automated exposure control, adjustment of the mA and/or kV according to patient size and/or use of iterative reconstruction technique. CONTRAST:  157m OMNIPAQUE IOHEXOL 300 MG/ML  SOLN COMPARISON:  None. FINDINGS: Lower Chest: No acute findings. Hepatobiliary: No hepatic masses identified. Mild diffuse hepatic steatosis. Gallbladder is unremarkable. No evidence of biliary ductal dilatation. Pancreas:  No mass or inflammatory changes. Spleen: Within normal limits in size and appearance. Adrenals/Urinary Tract: No masses identified. No evidence of ureteral calculi or hydronephrosis. A focal area of mild wall thickening and enhancement is seen along the posterior wall of the urinary bladder (see images 62/2, and 82/5), and early bladder neoplasm cannot be excluded. Stomach/Bowel: No evidence of obstruction,  inflammatory process or abnormal fluid collections. Normal appendix visualized. Vascular/Lymphatic: No pathologically enlarged lymph nodes. No acute vascular findings. Reproductive: Prior hysterectomy noted. Adnexal regions are unremarkable in appearance. Other:  None. Musculoskeletal:  No suspicious bone lesions identified. IMPRESSION: No acute findings. Mild hepatic steatosis. Focal area of mild wall thickening and enhancement involving the posterior wall of the urinary bladder. Early bladder neoplasm cannot be excluded. Recommend cystoscopy for further evaluation. Electronically Signed   By: JMarlaine HindM.D.   On: 01/17/2022 17:52    Procedures Fecal disimpaction  Date/Time: 01/17/2022 9:45 PM Performed by: PCharlesetta Shanks MD Authorized by: PCharlesetta Shanks MD  Consent: Verbal consent obtained. Written consent not obtained. Consent given by: patient  Local anesthesia used: no  Anesthesia: Local anesthesia used: no  Sedation: Patient sedated: no  Patient tolerance: patient tolerated the procedure well with no immediate complications Comments: Patient was manually impacted a moderate amount of hard brown stool.  Subsequently nurse administered enema with resultant large bowel movement.  Patient felt much improved.      Medications Ordered in ED Medications  0.9 %  sodium chloride infusion (0 mLs Intravenous Stopped 01/17/22 2041)  iohexol (OMNIPAQUE) 300 MG/ML solution 100 mL (100 mLs Intravenous Contrast Given 01/17/22 1724)  HYDROmorphone (DILAUDID) injection 0.5 mg (0.5 mg Intravenous Given 01/17/22 1842)  ondansetron (ZOFRAN) 4 MG/2ML injection (4 mg  Given 01/17/22 1854)    ED Course/ Medical Decision Making/ A&P                           Medical Decision Making Amount and/or Complexity of Data Reviewed Labs: ordered. Radiology: ordered.  Risk Prescription drug management.   Patient reports she developed constipation about 9 days duration.  She reports abdominal pain  diffusely in her lower abdomen.  CT scan obtained to rule out other etiology for pain.  Patient does not typically have problems with severe constipation.  Typically she is healthy but was seen in the past week for headache that is now resolved.  CT scan interpretation by radiology for no acute findings.  I have also reviewed all the images myself and identified patient does have a large amount stool in the rectal vault and also throughout the colon.  Urinalysis negative.  At this time symptoms are consistent with constipation.  Digital exam is consistent with impaction.  Patient was disimpacted manually and then subsequently enema administered.  Patient had large bowel movement with relief of symptoms and feels much improved.  Stable for discharge.  Patient was made aware of incidental finding of a bladder area of inflammation or mass that needs further evaluation to rule out tumor.  He voices understanding.  Patient is counseled on management at home for ongoing constipation with a dose of magnesium citrate and MiraLAX tomorrow morning followed by continue stool softener with Colace and MiraLAX as needed.        Final Clinical Impression(s) / ED Diagnoses Final diagnoses:  Constipation, unspecified constipation type  Bladder mass    Rx / DC Orders ED Discharge Orders     None         Charlesetta Shanks, MD 01/17/22 2148

## 2022-01-17 NOTE — ED Notes (Signed)
Pt NAD in chair, a/ox4. Pt states she came to this ED 9 days ago for headache, was given medicine, and since then has not had a BM. Pt endorses 9/10 lower abd pain. +firmness and distension, denies n/v ?

## 2022-01-17 NOTE — ED Notes (Signed)
Pt NAD, a/ox4. Pt verbalizes understanding of all DC and f/u instructions. All questions answered. Pt walks with steady gait to lobby at DC.  ? ?

## 2022-01-17 NOTE — ED Notes (Signed)
Patient transported to CT 

## 2022-01-17 NOTE — ED Notes (Signed)
Pt spouse comes to desk and requests pain medicine for pt. MD Pfeiffer sent message ?

## 2022-01-17 NOTE — ED Notes (Signed)
Pt assisted to restroom.  

## 2022-01-17 NOTE — ED Notes (Addendum)
Patient noted to have had large bowel movement. Patient endorsing relief at this time. Patient assisted with cleaning, independent in dressing self.  ?

## 2022-01-19 ENCOUNTER — Ambulatory Visit (INDEPENDENT_AMBULATORY_CARE_PROVIDER_SITE_OTHER): Payer: Medicare Other

## 2022-01-19 VITALS — Ht 66.0 in | Wt 162.0 lb

## 2022-01-19 DIAGNOSIS — Z0000 Encounter for general adult medical examination without abnormal findings: Secondary | ICD-10-CM

## 2022-01-19 NOTE — Progress Notes (Signed)
I connected with  Ellen Collins today via telehealth video enabled device and verified that I am speaking with the correct person using two identifiers.   Location: Patient: home Provider: work  Persons participating in virtual visit: Ellen Collins, Glenna Durand LPN  I discussed the limitations, risks, security and privacy concerns of performing an evaluation and management service by video and the availability of in person appointments. The patient expressed understanding and agreed to proceed.   Some vital signs may be absent or patient reported.     Subjective:   Ellen Collins is a 71 y.o. female who presents for Medicare Annual (Subsequent) preventive examination.  Review of Systems     Cardiac Risk Factors include: advanced age (>62mn, >>1women);dyslipidemia     Objective:    Today's Vitals   01/19/22 0853  Weight: 162 lb (73.5 kg)  Height: '5\' 6"'$  (1.676 m)   Body mass index is 26.15 kg/m.  Advanced Directives 01/19/2022 01/17/2022 01/10/2022 01/06/2021 05/26/2017  Does Patient Have a Medical Advance Directive? Yes No No Yes No  Type of AParamedicof [place] will - - [place] will -  Does patient want to make changes to medical advance directive? - - - No - Patient declined -  Copy of [place] Chart? No - copy requested - - No - copy requested -  Would patient like information on creating a medical advance directive? - - - - No - Patient declined    Current Medications (verified) Outpatient Encounter Medications as of 01/19/2022  Medication Sig   ALPRAZolam (XANAX) 0.5 MG tablet Take 0.5-1 tablets (0.25-0.5 mg total) by mouth as needed for anxiety.   azelastine (ASTELIN) 0.1 % nasal spray Place 2 sprays into both nostrils 2 (two) times daily. Use in each nostril as directed   betamethasone valerate lotion (VALISONE) 0.1 % Apply 1 application topically 2 (two) times daily.   estradiol  (ESTRACE) 1 MG tablet TAKE 1 TABLET BY MOUTH  DAILY   fluticasone (FLONASE) 50 MCG/ACT nasal spray Place 2 sprays into both nostrils daily.   loratadine (CLARITIN) 10 MG tablet Take 5 mg by mouth as needed. allergies   Multiple Vitamins-Minerals (MULTIVITAMIN WITH MINERALS) tablet Take 1 tablet by mouth daily.   NON FORMULARY Viviscal hair vitamin-Take one daily   Omega-3 Fatty Acids (FISH OIL) 1000 MG CAPS Take 1 capsule by mouth daily. Take 350 mg daily   Turmeric 500 MG TABS Take 500 mg by mouth daily.   vitamin C (ASCORBIC ACID) 500 MG tablet Take 500 mg by mouth daily.   zolpidem (AMBIEN) 5 MG tablet Take 1 tablet (5 mg total) by mouth at bedtime as needed for sleep.   ondansetron (ZOFRAN-ODT) 4 MG disintegrating tablet Take 1 tablet (4 mg total) by mouth every 8 (eight) hours as needed for nausea or vomiting. (Patient not taking: Reported on 01/19/2022)   traZODone (DESYREL) 50 MG tablet TAKE 1 TABLET BY MOUTH EVERYDAY AT BEDTIME (Patient not taking: Reported on 01/19/2022)   Facility-Administered Encounter Medications as of 01/19/2022  Medication   0.9 %  sodium chloride infusion    Allergies (verified) Latex   History: Past Medical History:  Diagnosis Date   Allergy    Anxiety    GERD (gastroesophageal reflux disease)    Low back pain radiating to both legs    worse of left side   Paroxysmal SVT (supraventricular tachycardia) ([place]    she reports rare  problems over years   Past Surgical History:  Procedure Laterality Date   broken foot     right foot/ has pins   COLONOSCOPY     UPPER GASTROINTESTINAL ENDOSCOPY     VAGINAL HYSTERECTOMY  1998   Family History  Problem Relation Age of Onset   Colon cancer Father 43   Ovarian cancer Mother    Social History   Socioeconomic History   Marital status: Married    Spouse name: Not on file   Number of children: Not on file   Years of education: Not on file   Highest education level: Not on file  Occupational History    Not on file  Tobacco Use   Smoking status: Never   Smokeless tobacco: Never  Vaping Use   Vaping Use: Never used  Substance and Sexual Activity   Alcohol use: Yes    Alcohol/week: 1.0 standard drink    Types: 1 Glasses of wine per week    Comment: social   Drug use: No   Sexual activity: Not on file  Other Topics Concern   Not on file  Social History Narrative   Not on file   Social Determinants of Health   Financial Resource Strain: Low Risk    Difficulty of Paying Living Expenses: Not hard at all  Food Insecurity: No Food Insecurity   Worried About Charity fundraiser in the Last Year: Never true   [place] in the Last Year: Never true  Transportation Needs: No Transportation Needs   Lack of Transportation (Medical): No   Lack of Transportation (Non-Medical): No  Physical Activity: Sufficiently Active   Days of Exercise per Week: 5 days   Minutes of Exercise per Session: 60 min  Stress: Stress Concern Present   Feeling of Stress : To some extent  Social Connections: Not on file    Tobacco Counseling Counseling given: Not Answered   Clinical Intake:  Pre-visit preparation completed: Yes  Pain : No/denies pain     Nutritional Status: BMI 25 -29 Overweight Nutritional Risks: Nausea/ vomitting/ diarrhea (last week with headche had nausea) Diabetes: No  How often do you need to have someone help you when you read instructions, pamphlets, or other written materials from your doctor or pharmacy?: 1 - Never What is the last grade level you completed in school?: college  Diabetic? no  Interpreter Needed?: No  Information entered by :: NAllen LPN   Activities of Daily Living In your present state of health, do you have any difficulty performing the following activities: 01/19/2022 01/18/2022  Hearing? N N  Vision? N N  Difficulty concentrating or making decisions? N N  Walking or climbing stairs? N N  Dressing or bathing? N N  Doing errands, shopping?  N N  Preparing Food and eating ? N N  Using the Toilet? N N  In the past six months, have you accidently leaked urine? N N  Do you have problems with loss of bowel control? N N  Managing your Medications? N N  Managing your Finances? N N  Housekeeping or managing your Housekeeping? N N  Some recent data might be hidden    Patient Care Team: Caren Macadam, MD as PCP - General (Family Medicine) Martinique, Amy, MD as Consulting Physician (Dermatology) Janyth Contes, MD as Consulting Physician (Obstetrics and Gynecology) Viona Gilmore, [facility] as Pharmacist (Pharmacist)  Indicate any recent Medical Services you may have received from other than [facility] providers  in the past year (date may be approximate).     Assessment:   This is a routine wellness examination for Ellen Collins.  Hearing/Vision screen Vision Screening - Comments:: Regular eye exams, Dr. Gershon Crane  Dietary issues and exercise activities discussed: Current Exercise Habits: Home exercise routine, Type of exercise: walking, Time (Minutes): 60, Frequency (Times/Week): 5, Weekly Exercise (Minutes/Week): 300   Goals Addressed             This Visit's Progress    Patient Stated       01/19/2022, eat better lose weight       Depression Screen PHQ 2/9 Scores 01/19/2022 01/06/2021 09/30/2020  PHQ - 2 Score 0 0 0    Fall Risk Fall Risk  01/19/2022 01/18/2022 01/06/2022 01/06/2022 01/06/2021  Falls in the past year? 0 0 0 0 0  Number falls in past yr: - 0 0 0 0  Injury with Fall? - 0 0 0 0  Risk for fall due to : Medication side effect - - - No Fall Risks  Follow up Falls evaluation completed;Education provided;Falls prevention discussed - - - Falls evaluation completed;Falls prevention discussed    FALL RISK PREVENTION PERTAINING TO THE HOME:  Any stairs in or around the home? Yes  If so, are there any without handrails? No  Home free of loose throw rugs in walkways, pet beds, electrical cords, etc? Yes  Adequate  lighting in your home to reduce risk of falls? Yes   ASSISTIVE DEVICES UTILIZED TO PREVENT FALLS:  Life alert? No  Use of a cane, walker or w/c? No  Grab bars in the bathroom? No  Shower chair or bench in shower? Yes  Elevated toilet seat or a handicapped toilet? Yes   TIMED UP AND GO:  Was the test performed? No .      Cognitive Function:        Immunizations Immunization History  Administered Date(s) Administered   Influenza Split 08/02/2012   Influenza, High Dose Seasonal PF 07/29/2018   Influenza,inj,Quad PF,6+ Mos 07/20/2013, 08/13/2014, 08/16/2015, 08/12/2016, 08/10/2017   Influenza-Unspecified 07/08/2019, 07/19/2020, 08/01/2021   PFIZER(Purple Top)SARS-COV-2 Vaccination 12/14/2019, 01/08/2020, 08/05/2020   PNEUMOCOCCAL CONJUGATE-20 12/09/2021   Pfizer Covid-19 Vaccine Bivalent Booster 59yr & up 08/01/2021   Pneumococcal Polysaccharide-23 07/08/2019   Td 11/09/2005   Tdap 07/17/2019   Zoster Recombinat (Shingrix) 04/29/2020, 07/19/2020   Zoster, Live 04/17/2013    TDAP status: Up to date  Flu Vaccine status: Up to date  Pneumococcal vaccine status: Up to date  Covid-19 vaccine status: Completed vaccines  Qualifies for Shingles Vaccine? Yes   Zostavax completed Yes   Shingrix Completed?: Yes  Screening Tests Health Maintenance  Topic Date Due   COLONOSCOPY (Pts [phone] coverage will need to be confirmed)  02/09/2022   MAMMOGRAM  12/03/2022   TETANUS/TDAP  07/16/2029   Pneumonia Vaccine 65102Years old  Completed   INFLUENZA VACCINE  Completed   DEXA SCAN  Completed   COVID-19 Vaccine  Completed   Hepatitis C Screening  Completed   Zoster Vaccines- Shingrix  Completed   HPV VACCINES  Aged Out    Health Maintenance  There are no preventive care reminders to display for this patient.   Colorectal cancer screening: Type of screening: Colonoscopy. Completed 02/09/2017. Repeat every 5 years  Mammogram status: Completed 12/03/2021. Repeat  every year  Bone Density status: Completed 11/22/2019.   Lung Cancer Screening: (Low Dose CT Chest recommended if Age 69-80ears, 30 pack-year currently smoking OR  have quit w/in 15years.) does not qualify.   Lung Cancer Screening Referral: no  Additional Screening:  Hepatitis C Screening: does qualify; Completed 04/01/2020  Vision Screening: Recommended annual ophthalmology exams for early detection of glaucoma and other disorders of the eye. Is the patient up to date with their annual eye exam?  Yes  Who is the provider or what is the name of the office in which the patient attends annual eye exams? Dr. Gershon Crane If pt is not established with a provider, would they like to be referred to a provider to establish care? No .   Dental Screening: Recommended annual dental exams for proper oral hygiene  Community Resource Referral / Chronic Care Management: CRR required this visit?  No   CCM required this visit?  No      Plan:     I have personally reviewed and noted the following in the patients chart:   Medical and social history Use of alcohol, tobacco or illicit drugs  Current medications and supplements including opioid prescriptions.  Functional ability and status Nutritional status Physical activity Advanced directives List of other physicians Hospitalizations, surgeries, and ER visits in previous 12 months Vitals Screenings to include cognitive, depression, and falls Referrals and appointments  In addition, I have reviewed and discussed with patient certain preventive protocols, quality metrics, and best practice recommendations. A written personalized care plan for preventive services as well as general preventive health recommendations were provided to patient.     Kellie Simmering, LPN   [id number]   Nurse Notes: 6 CIT not administered. Patient has normal cognition per conversation.  Due to this being a virtual visit, the after visit summary with patients  personalized plan was offered to patient via mail or my-chart. Patient would like to access on my-chart

## 2022-01-19 NOTE — Patient Instructions (Signed)
[name] , Thank you for taking time to come for your Medicare Wellness Visit. I appreciate your ongoing commitment to your health goals. Please review the following plan we discussed and let me know if I can assist you in the future.   Screening recommendations/referrals: Colonoscopy: completed 02/09/2017, due 02/10/2022 Mammogram: completed 12/03/2021, due 12/04/2022 Bone Density: completed 11/22/2019 Recommended yearly ophthalmology/optometry visit for glaucoma screening and checkup Recommended yearly dental visit for hygiene and checkup  Vaccinations: Influenza vaccine: completed 08/01/2021, due next flu season Pneumococcal vaccine: completed 12/09/2021 Tdap vaccine: completed 07/17/2019, due 07/16/2029 Shingles vaccine: completed   Covid-19: 08/01/2021, 08/05/2020, 01/08/2020, 12/14/2019  Advanced directives: Please bring a copy of your POA (Power of Attorney) and/or Living Will to your next appointment.   Conditions/risks identified: none  Next appointment: Follow up in one year for your annual wellness visit    Preventive Care 65 Years and Older, Female Preventive care refers to lifestyle choices and visits with your health care provider that can promote health and wellness. What does preventive care include? A yearly physical exam. This is also called an annual well check. Dental exams once or twice a year. Routine eye exams. Ask your health care provider how often you should have your eyes checked. Personal lifestyle choices, including: Daily care of your teeth and gums. Regular physical activity. Eating a healthy diet. Avoiding tobacco and drug use. Limiting alcohol use. Practicing safe sex. Taking low-dose aspirin every day. Taking vitamin and mineral supplements as recommended by your health care provider. What happens during an annual well check? The services and screenings done by your health care provider during your annual well check will depend on your age, overall health,  lifestyle risk factors, and family history of disease. Counseling  Your health care provider may ask you questions about your: Alcohol use. Tobacco use. Drug use. Emotional well-being. Home and relationship well-being. Sexual activity. Eating habits. History of falls. Memory and ability to understand (cognition). Work and work Statistician. Reproductive health. Screening  You may have the following tests or measurements: Height, weight, and BMI. Blood pressure. Lipid and cholesterol levels. These may be checked every 5 years, or more frequently if you are over 76 years old. Skin check. Lung cancer screening. You may have this screening every year starting at age 8 if you have a 30-pack-year history of smoking and currently smoke or have quit within the past 15 years. Fecal occult blood test (FOBT) of the stool. You may have this test every year starting at age 80. Flexible sigmoidoscopy or colonoscopy. You may have a sigmoidoscopy every 5 years or a colonoscopy every 10 years starting at age 66. Hepatitis C blood test. Hepatitis B blood test. Sexually transmitted disease (STD) testing. Diabetes screening. This is done by checking your blood sugar (glucose) after you have not eaten for a while (fasting). You may have this done every 1-3 years. Bone density scan. This is done to screen for osteoporosis. You may have this done starting at age 57. Mammogram. This may be done every 1-2 years. Talk to your health care provider about how often you should have regular mammograms. Talk with your health care provider about your test results, treatment options, and if necessary, the need for more tests. Vaccines  Your health care provider may recommend certain vaccines, such as: Influenza vaccine. This is recommended every year. Tetanus, diphtheria, and acellular pertussis (Tdap, Td) vaccine. You may need a Td booster every 10 years. Zoster vaccine. You may need this after  age  21. Pneumococcal 13-valent conjugate (PCV13) vaccine. One dose is recommended after age 51. Pneumococcal polysaccharide (PPSV23) vaccine. One dose is recommended after age 41. Talk to your health care provider about which screenings and vaccines you need and how often you need them. This information is not intended to replace advice given to you by your health care provider. Make sure you discuss any questions you have with your health care provider. Document Released: 11/22/2015 Document Revised: 07/15/2016 Document Reviewed: 08/27/2015 Elsevier Interactive Patient Education  2017 [place] Prevention in the Home Falls can cause injuries. They can happen to people of all ages. There are many things you can do to make your home safe and to help prevent falls. What can I do on the outside of my home? Regularly fix the edges of walkways and driveways and fix any cracks. Remove anything that might make you trip as you walk through a door, such as a raised step or threshold. Trim any bushes or trees on the path to your home. Use bright outdoor lighting. Clear any walking paths of anything that might make someone trip, such as rocks or tools. Regularly check to see if handrails are loose or broken. Make sure that both sides of any steps have handrails. Any raised decks and porches should have guardrails on the edges. Have any leaves, snow, or ice cleared regularly. Use sand or salt on walking paths during winter. Clean up any spills in your garage right away. This includes oil or grease spills. What can I do in the bathroom? Use night lights. Install grab bars by the toilet and in the tub and shower. Do not use towel bars as grab bars. Use non-skid mats or decals in the tub or shower. If you need to sit down in the shower, use a plastic, non-slip stool. Keep the floor dry. Clean up any water that spills on the floor as soon as it happens. Remove soap buildup in the tub or shower  regularly. Attach bath mats securely with double-sided non-slip rug tape. Do not have throw rugs and other things on the floor that can make you trip. What can I do in the bedroom? Use night lights. Make sure that you have a light by your bed that is easy to reach. Do not use any sheets or blankets that are too big for your bed. They should not hang down onto the floor. Have a firm chair that has side arms. You can use this for support while you get dressed. Do not have throw rugs and other things on the floor that can make you trip. What can I do in the kitchen? Clean up any spills right away. Avoid walking on wet floors. Keep items that you use a lot in easy-to-reach places. If you need to reach something above you, use a strong step stool that has a grab bar. Keep electrical cords out of the way. Do not use floor polish or wax that makes floors slippery. If you must use wax, use non-skid floor wax. Do not have throw rugs and other things on the floor that can make you trip. What can I do with my stairs? Do not leave any items on the stairs. Make sure that there are handrails on both sides of the stairs and use them. Fix handrails that are broken or loose. Make sure that handrails are as long as the stairways. Check any carpeting to make sure that it is firmly attached to the stairs.  Fix any carpet that is loose or worn. Avoid having throw rugs at the top or bottom of the stairs. If you do have throw rugs, attach them to the floor with carpet tape. Make sure that you have a light switch at the top of the stairs and the bottom of the stairs. If you do not have them, ask someone to add them for you. What else can I do to help prevent falls? Wear shoes that: Do not have high heels. Have rubber bottoms. Are comfortable and fit you well. Are closed at the toe. Do not wear sandals. If you use a stepladder: Make sure that it is fully opened. Do not climb a closed stepladder. Make sure that  both sides of the stepladder are locked into place. Ask someone to hold it for you, if possible. Clearly mark and make sure that you can see: Any grab bars or handrails. First and last steps. Where the edge of each step is. Use tools that help you move around (mobility aids) if they are needed. These include: Canes. Walkers. Scooters. Crutches. Turn on the lights when you go into a dark area. Replace any light bulbs as soon as they burn out. Set up your furniture so you have a clear path. Avoid moving your furniture around. If any of your floors are uneven, fix them. If there are any pets around you, be aware of where they are. Review your medicines with your doctor. Some medicines can make you feel dizzy. This can increase your chance of falling. Ask your doctor what other things that you can do to help prevent falls. This information is not intended to replace advice given to you by your health care provider. Make sure you discuss any questions you have with your health care provider. Document Released: 08/22/2009 Document Revised: 04/02/2016 Document Reviewed: 11/30/2014 Elsevier Interactive Patient Education  2017 [employer].

## 2022-01-26 ENCOUNTER — Encounter: Payer: Self-pay | Admitting: Family Medicine

## 2022-01-26 ENCOUNTER — Ambulatory Visit (INDEPENDENT_AMBULATORY_CARE_PROVIDER_SITE_OTHER): Payer: Medicare Other | Admitting: Family Medicine

## 2022-01-26 VITALS — BP 140/68 | HR 80 | Temp 98.1°F | Ht 66.0 in | Wt 163.4 lb

## 2022-01-26 DIAGNOSIS — R739 Hyperglycemia, unspecified: Secondary | ICD-10-CM

## 2022-01-26 DIAGNOSIS — E871 Hypo-osmolality and hyponatremia: Secondary | ICD-10-CM

## 2022-01-26 DIAGNOSIS — N3289 Other specified disorders of bladder: Secondary | ICD-10-CM

## 2022-01-26 DIAGNOSIS — E782 Mixed hyperlipidemia: Secondary | ICD-10-CM | POA: Diagnosis not present

## 2022-01-26 LAB — COMPREHENSIVE METABOLIC PANEL
ALT: 20 U/L (ref 0–35)
AST: 18 U/L (ref 0–37)
Albumin: 4.5 g/dL (ref 3.5–5.2)
Alkaline Phosphatase: 40 U/L (ref 39–117)
BUN: 9 mg/dL (ref 6–23)
CO2: 28 mEq/L (ref 19–32)
Calcium: 9.3 mg/dL (ref 8.4–10.5)
Chloride: 98 mEq/L (ref 96–112)
Creatinine, Ser: 0.91 mg/dL (ref 0.40–1.20)
GFR: 64.53 mL/min (ref >60.00)
Glucose, Bld: 106 mg/dL — ABNORMAL HIGH (ref 70–99)
Potassium: 4.1 mEq/L (ref 3.5–5.1)
Sodium: 134 mEq/L — ABNORMAL LOW (ref 135–145)
Total Bilirubin: 0.6 mg/dL (ref 0.2–1.2)
Total Protein: 7 g/dL (ref 6.0–8.3)

## 2022-01-26 LAB — LIPID PANEL
Cholesterol: 200 mg/dL (ref 0–200)
HDL: 61.8 mg/dL (ref >39.00)
LDL Cholesterol: 111 mg/dL — ABNORMAL HIGH (ref 0–99)
NonHDL: 138.01
Total CHOL/HDL Ratio: 3
Triglycerides: 136 mg/dL (ref 0.0–149.0)
VLDL: 27.2 mg/dL (ref 0.0–40.0)

## 2022-01-26 LAB — HEMOGLOBIN A1C: Hgb A1c MFr Bld: 6.3 % (ref 4.6–6.5)

## 2022-01-26 NOTE — Progress Notes (Signed)
?Ellen Collins ?DOB: 1951-08-07 ?Encounter date: 01/26/2022 ? ?This is a 71 y.o. female who presents with ?Chief Complaint  ?Patient presents with  ? Hospitalization Follow-up  ? ? ?History of present illness: ?Patient was seen in the emergency room on 01/17/2022 for constipation.  At that point she had not had a bowel movement in 9 days.  She underwent fecal disimpaction.  This was followed by a enema.  Patient felt significantly better after procedure.  Encouraged to continue stool softener and MiraLAX at home.  CT scan was completed and demonstrated no issues with bowels, but a focal area of mild wall thickening of the bladder with enhancement was seen along the posterior wall and commented that her early bladder neoplasm cannot be excluded.  Cystoscopy was recommended for follow-up. ? ?Sat night taken to ER prior to above visit - given pain meds for headache and this stopped her up.  ? ?States no blood in urine, no abd pain outside of constipation above.  ? ?Usually has bowel movements every day. She is no longer taking miralax. Took that for about 5 days. Really hasn't had issue with constipation in the past. Usually has daily bowel movements.  ? ?Does check bp at home - usually less than 716 systolic at home. She is anxious.  ? ? ?Allergies  ?Allergen Reactions  ? Latex   ?  Itching, and redness  ? ?Current Meds  ?Medication Sig  ? ALPRAZolam (XANAX) 0.5 MG tablet Take 0.5-1 tablets (0.25-0.5 mg total) by mouth as needed for anxiety.  ? azelastine (ASTELIN) 0.1 % nasal spray Place 2 sprays into both nostrils 2 (two) times daily. Use in each nostril as directed  ? betamethasone valerate lotion (VALISONE) 0.1 % Apply 1 application topically 2 (two) times daily.  ? estradiol (ESTRACE) 1 MG tablet TAKE 1 TABLET BY MOUTH  DAILY  ? fluticasone (FLONASE) 50 MCG/ACT nasal spray Place 2 sprays into both nostrils daily.  ? loratadine (CLARITIN) 10 MG tablet Take 5 mg by mouth as needed. allergies  ? Multiple  Vitamins-Minerals (MULTIVITAMIN WITH MINERALS) tablet Take 1 tablet by mouth daily.  ? NON FORMULARY Viviscal hair vitamin-Take one daily  ? Omega-3 Fatty Acids (FISH OIL) 1000 MG CAPS Take 1 capsule by mouth daily. Take 350 mg daily  ? ondansetron (ZOFRAN-ODT) 4 MG disintegrating tablet Take 1 tablet (4 mg total) by mouth every 8 (eight) hours as needed for nausea or vomiting.  ? traZODone (DESYREL) 50 MG tablet TAKE 1 TABLET BY MOUTH EVERYDAY AT BEDTIME  ? Turmeric 500 MG TABS Take 500 mg by mouth daily.  ? vitamin C (ASCORBIC ACID) 500 MG tablet Take 500 mg by mouth daily.  ? zolpidem (AMBIEN) 5 MG tablet Take 1 tablet (5 mg total) by mouth at bedtime as needed for sleep.  ? ?Current Facility-Administered Medications for the 01/26/22 encounter (Office Visit) with Caren Macadam, MD  ?Medication  ? 0.9 %  sodium chloride infusion  ? ? ?Review of Systems  ?Constitutional:  Negative for chills, fatigue and fever.  ?Respiratory:  Negative for cough, chest tightness, shortness of breath and wheezing.   ?Cardiovascular:  Negative for chest pain, palpitations and leg swelling.  ? ?Objective: ? ?BP 140/68   Pulse 80   Temp 98.1 ?F (36.7 ?C) (Oral)   Ht '5\' 6"'$  (1.676 m)   Wt 163 lb 6.4 oz (74.1 kg)   SpO2 100%   BMI 26.37 kg/m?   Weight: 163 lb 6.4 oz (74.1 kg)  ? ?  BP Readings from Last 3 Encounters:  ?01/26/22 140/68  ?01/17/22 110/69  ?01/10/22 (!) 147/81  ? ?Wt Readings from Last 3 Encounters:  ?01/26/22 163 lb 6.4 oz (74.1 kg)  ?01/19/22 162 lb (73.5 kg)  ?01/17/22 169 lb 12.1 oz (77 kg)  ? ? ?Physical Exam ?Constitutional:   ?   General: She is not in acute distress. ?   Appearance: She is well-developed.  ?Cardiovascular:  ?   Rate and Rhythm: Normal rate and regular rhythm.  ?   Heart sounds: Normal heart sounds. No murmur heard. ?  No friction rub.  ?Pulmonary:  ?   Effort: Pulmonary effort is normal. No respiratory distress.  ?   Breath sounds: Normal breath sounds. No wheezing or rales.   ?Musculoskeletal:  ?   Right lower leg: No edema.  ?   Left lower leg: No edema.  ?Neurological:  ?   Mental Status: She is alert and oriented to person, place, and time.  ?Psychiatric:     ?   Behavior: Behavior normal.  ? ? ?Assessment/Plan ?1. Bladder wall thickening ?She will need cystoscopy. [facility] given for [facility] and referral placed.  ?- Ambulatory referral to Urology ? ?2. Mixed hyperlipidemia ?Will recheck and then discuss at upcoming physical. ?- Comprehensive metabolic panel; Future ?- Lipid panel; Future ?- Lipid panel ?- Comprehensive metabolic panel ? ?3. Hyponatremia ?Likely related to GI issues she was seen for in ER. Recheck now that she is back at baseline.  ?- Comprehensive metabolic panel; Future ?- Comprehensive metabolic panel ? ?4. Hyperglycemia ?- Hemoglobin A1c; Future ?- Hemoglobin A1c ? ? ? ?Return for has physical scheduled. ?36 minutes spent in chart review, time with patient, exam, charting, discussion of blood pressure monitoring at home/bring home cuff to next visit. Discussed continuation of colace for another week,then wean off as long as stools tolerate. ? ? ? ?Micheline Rough, MD ?

## 2022-01-26 NOTE — Patient Instructions (Signed)
[facility]: [phone] ([place]; [place]) ?

## 2022-01-29 DIAGNOSIS — R9341 Abnormal radiologic findings on diagnostic imaging of renal pelvis, ureter, or bladder: Secondary | ICD-10-CM | POA: Diagnosis not present

## 2022-02-09 ENCOUNTER — Encounter: Payer: Medicare Other | Admitting: Family Medicine

## 2022-03-02 ENCOUNTER — Encounter: Payer: Self-pay | Admitting: Family Medicine

## 2022-03-02 ENCOUNTER — Ambulatory Visit (INDEPENDENT_AMBULATORY_CARE_PROVIDER_SITE_OTHER): Payer: Medicare Other | Admitting: Family Medicine

## 2022-03-02 VITALS — BP 140/76 | HR 88 | Temp 97.8°F | Ht 65.5 in | Wt 166.1 lb

## 2022-03-02 DIAGNOSIS — Z0000 Encounter for general adult medical examination without abnormal findings: Secondary | ICD-10-CM | POA: Diagnosis not present

## 2022-03-02 DIAGNOSIS — L309 Dermatitis, unspecified: Secondary | ICD-10-CM | POA: Diagnosis not present

## 2022-03-02 DIAGNOSIS — E782 Mixed hyperlipidemia: Secondary | ICD-10-CM

## 2022-03-02 DIAGNOSIS — I10 Essential (primary) hypertension: Secondary | ICD-10-CM

## 2022-03-02 DIAGNOSIS — H9202 Otalgia, left ear: Secondary | ICD-10-CM | POA: Diagnosis not present

## 2022-03-02 DIAGNOSIS — F411 Generalized anxiety disorder: Secondary | ICD-10-CM

## 2022-03-02 MED ORDER — ZOLPIDEM TARTRATE 5 MG PO TABS: 5.0000 mg | ORAL_TABLET | Freq: Every evening | ORAL | 3 refills | Status: DC | PRN

## 2022-03-02 MED ORDER — BETAMETHASONE VALERATE 0.1 % EX LOTN: 1.0000 "application " | TOPICAL_LOTION | Freq: Two times a day (BID) | CUTANEOUS | 0 refills | Status: DC

## 2022-03-02 MED ORDER — PREDNISONE 20 MG PO TABS: ORAL_TABLET | ORAL | 0 refills | Status: DC

## 2022-03-02 MED ORDER — ALPRAZOLAM 0.5 MG PO TABS: 0.2500 mg | ORAL_TABLET | ORAL | 0 refills | Status: DC | PRN

## 2022-03-02 MED ORDER — ESTRADIOL 1 MG PO TABS: 1.0000 mg | ORAL_TABLET | Freq: Every day | ORAL | 3 refills | Status: DC

## 2022-03-02 MED ORDER — AZELASTINE HCL 0.1 % NA SOLN: 2.0000 | Freq: Two times a day (BID) | NASAL | 3 refills | Status: DC

## 2022-03-02 MED ORDER — FLUTICASONE PROPIONATE 50 MCG/ACT NA SUSP: 2.0000 | Freq: Every day | NASAL | 3 refills | Status: DC

## 2022-03-02 NOTE — Progress Notes (Signed)
Ellen Collins ?DOB: 31-Jan-1951 ?Encounter date: 03/02/2022 ? ?This is a 71 y.o. female who presents for complete physical  ? ?History of present illness/Additional concerns: ?Patient was seen about 1 month ago for hospital follow-up from constipation with impaction. ? ?Bp at home between 125 (lowest she really sees) 140 (most in higher 130's)/70's-86.  ? ?She will be getting colonoscopy this year; she is calling next month to schedule. ? ?Following with eye doc regularly, dentist regularly and dr. [place]ique (derm) annually. ? ?The 10-year ASCVD risk score (Arnett DK, et al., 2019) is: 9.8% ?  Values used to calculate the score: ?    Age: 39 years ?    Sex: Female ?    Is Non-Hispanic African American: No ?    Diabetic: No ?    Tobacco smoker: No ?    Systolic Blood Pressure: 094 mmHg ?    Is BP treated: No ?    HDL Cholesterol: 61.8 mg/dL ?    Total Cholesterol: 200 mg/dL ? ? ?Walks regularly.  ? ? ?Past Medical History:  ?Diagnosis Date  ? Allergy   ? Anxiety   ? GERD (gastroesophageal reflux disease)   ? Low back pain radiating to both legs   ? worse of left side  ? Paroxysmal SVT (supraventricular tachycardia) (HCC)   ? she reports rare problems over years  ? ?Past Surgical History:  ?Procedure Laterality Date  ? broken foot    ? right foot/ has pins  ? COLONOSCOPY    ? UPPER GASTROINTESTINAL ENDOSCOPY    ? VAGINAL HYSTERECTOMY  1998  ? ?Allergies  ?Allergen Reactions  ? Latex   ?  Itching, and redness  ? ?Current Meds  ?Medication Sig  ? loratadine (CLARITIN) 10 MG tablet Take 5 mg by mouth as needed. allergies  ? Multiple Vitamins-Minerals (MULTIVITAMIN WITH MINERALS) tablet Take 1 tablet by mouth daily.  ? NON FORMULARY Viviscal hair vitamin-Take one daily  ? Omega-3 Fatty Acids (FISH OIL) 1000 MG CAPS Take 1 capsule by mouth daily. Take 350 mg daily  ? traZODone (DESYREL) 50 MG tablet TAKE 1 TABLET BY MOUTH EVERYDAY AT BEDTIME  ? Turmeric 500 MG TABS Take 500 mg by mouth daily.  ? vitamin C (ASCORBIC ACID) 500 MG  tablet Take 500 mg by mouth daily.  ? [DISCONTINUED] ALPRAZolam (XANAX) 0.5 MG tablet Take 0.5-1 tablets (0.25-0.5 mg total) by mouth as needed for anxiety.  ? [DISCONTINUED] azelastine (ASTELIN) 0.1 % nasal spray Place 2 sprays into both nostrils 2 (two) times daily. Use in each nostril as directed  ? [DISCONTINUED] betamethasone valerate lotion (VALISONE) 0.1 % Apply 1 application topically 2 (two) times daily.  ? [DISCONTINUED] estradiol (ESTRACE) 1 MG tablet TAKE 1 TABLET BY MOUTH  DAILY  ? [DISCONTINUED] fluticasone (FLONASE) 50 MCG/ACT nasal spray Place 2 sprays into both nostrils daily.  ? [DISCONTINUED] zolpidem (AMBIEN) 5 MG tablet Take 1 tablet (5 mg total) by mouth at bedtime as needed for sleep.  ? ?Current Facility-Administered Medications for the 03/02/22 encounter (Office Visit) with Caren Macadam, MD  ?Medication  ? 0.9 %  sodium chloride infusion  ? ?Social History  ? ?Tobacco Use  ? Smoking status: Never  ? Smokeless tobacco: Never  ?Substance Use Topics  ? Alcohol use: Yes  ?  Alcohol/week: 1.0 standard drink  ?  Types: 1 Glasses of wine per week  ?  Comment: social  ? ?Family History  ?Problem Relation Age of Onset  ? Colon  cancer Father 42  ? Ovarian cancer Mother   ? ? ? ?Review of Systems  ?Constitutional:  Negative for activity change, appetite change, chills, fatigue, fever and unexpected weight change.  ?HENT:  Negative for congestion, ear pain, hearing loss, sinus pressure, sinus pain, sore throat and trouble swallowing.   ?     Right ear congested, clicking. When gets to this point, really feels prednisone is most helpful for reversing issue.  ?Eyes:  Negative for pain and visual disturbance.  ?Respiratory:  Negative for cough, chest tightness, shortness of breath and wheezing.   ?Cardiovascular:  Negative for chest pain, palpitations and leg swelling.  ?Gastrointestinal:  Negative for abdominal pain, blood in stool, constipation, diarrhea, nausea and vomiting.  ?Genitourinary:   Negative for difficulty urinating and menstrual problem.  ?Musculoskeletal:  Negative for arthralgias and back pain.  ?Skin:  Negative for rash.  ?Neurological:  Negative for dizziness, weakness, numbness and headaches.  ?Hematological:  Negative for adenopathy. Does not bruise/bleed easily.  ?Psychiatric/Behavioral:  Negative for sleep disturbance and suicidal ideas. The patient is not nervous/anxious.   ? ?Hasn't had xanax since 2021, but likes to keep on hand.  ?Still taking the estradiol. Does not want to taper down but will consider. Helps with mood overall, hair (has loss without med) ? ?Tried trazodone '25mg'$  and then up to '50mg'$  and it didn't help. Still has supply at home and willing to try slightly higher dose. Uses ambien every few nights.  ? ?CBC:  ?Lab Results  ?Component Value Date  ? WBC 9.0 01/17/2022  ? HGB 13.3 01/17/2022  ? HCT 39.6 01/17/2022  ? MCH 31.8 01/17/2022  ? MCHC 33.6 01/17/2022  ? RDW 12.1 01/17/2022  ? PLT 321 01/17/2022  ? ?CMP: ?Lab Results  ?Component Value Date  ? NA 134 (L) 01/26/2022  ? K 4.1 01/26/2022  ? CL 98 01/26/2022  ? CO2 28 01/26/2022  ? ANIONGAP 9 01/17/2022  ? GLUCOSE 106 (H) 01/26/2022  ? BUN 9 01/26/2022  ? CREATININE 0.91 01/26/2022  ? GFRAA >60 05/26/2017  ? CALCIUM 9.3 01/26/2022  ? PROT 7.0 01/26/2022  ? BILITOT 0.6 01/26/2022  ? ALKPHOS 40 01/26/2022  ? ALT 20 01/26/2022  ? AST 18 01/26/2022  ? ?LIPID: ?Lab Results  ?Component Value Date  ? CHOL 200 01/26/2022  ? TRIG 136.0 01/26/2022  ? HDL 61.80 01/26/2022  ? LDLCALC 111 (H) 01/26/2022  ? ? ?Objective: ? ?BP 140/76 Comment: repeat by Mykal--jaf  Pulse 88   Temp 97.8 ?F (36.6 ?C) (Oral)   Ht 5' 5.5" (1.664 m)   Wt 166 lb 1.6 oz (75.3 kg)   SpO2 98%   BMI 27.22 kg/m?   Weight: 166 lb 1.6 oz (75.3 kg)  ? ?BP Readings from Last 3 Encounters:  ?03/02/22 140/76  ?01/26/22 140/68  ?01/17/22 110/69  ? ?Wt Readings from Last 3 Encounters:  ?03/02/22 166 lb 1.6 oz (75.3 kg)  ?01/26/22 163 lb 6.4 oz (74.1 kg)   ?01/19/22 162 lb (73.5 kg)  ? ? ?Physical Exam ?Constitutional:   ?   General: She is not in acute distress. ?   Appearance: She is well-developed.  ?HENT:  ?   Head: Normocephalic and atraumatic.  ?   Right Ear: External ear normal.  ?   Left Ear: External ear normal.  ?   Mouth/Throat:  ?   Pharynx: No oropharyngeal exudate.  ?Eyes:  ?   Conjunctiva/sclera: Conjunctivae normal.  ?   Pupils: Pupils are  equal, round, and reactive to light.  ?Neck:  ?   Thyroid: No thyromegaly.  ?Cardiovascular:  ?   Rate and Rhythm: Normal rate and regular rhythm.  ?   Heart sounds: Normal heart sounds. No murmur heard. ?  No friction rub. No gallop.  ?Pulmonary:  ?   Effort: Pulmonary effort is normal.  ?   Breath sounds: Normal breath sounds.  ?Abdominal:  ?   General: Bowel sounds are normal. There is no distension.  ?   Palpations: Abdomen is soft. There is no mass.  ?   Tenderness: There is no abdominal tenderness. There is no guarding.  ?   Hernia: No hernia is present.  ?Musculoskeletal:     ?   General: No tenderness or deformity. Normal range of motion.  ?   Cervical back: Normal range of motion and neck supple.  ?Lymphadenopathy:  ?   Cervical: No cervical adenopathy.  ?Skin: ?   General: Skin is warm and dry.  ?   Findings: No rash.  ?Neurological:  ?   Mental Status: She is alert and oriented to person, place, and time.  ?   Deep Tendon Reflexes: Reflexes normal.  ?   Reflex Scores: ?     Tricep reflexes are 2+ on the right side and 2+ on the left side. ?     Bicep reflexes are 2+ on the right side and 2+ on the left side. ?     Brachioradialis reflexes are 2+ on the right side and 2+ on the left side. ?     Patellar reflexes are 2+ on the right side and 2+ on the left side. ?Psychiatric:     ?   Speech: Speech normal.     ?   Behavior: Behavior normal.     ?   Thought Content: Thought content normal.  ? ? ?Assessment/Plan: ?Health Maintenance Due  ?Topic Date Due  ? COLONOSCOPY (Pts 45-59yr Insurance coverage will  need to be confirmed)  02/09/2022  ? ?Health Maintenance reviewed. ? ?1. Preventative health care ?Keep up with regular exercise ? ?2. Mixed hyperlipidemia ?Lipids improved. Discussed ascvd risk. Prefers to avoid statin.

## 2022-03-02 NOTE — Patient Instructions (Signed)
*  try trazodone 2 tablets ('100mg'$ ), and see if this works for sleep. If not, we can stop trying.  ?

## 2022-03-04 ENCOUNTER — Encounter: Payer: Self-pay | Admitting: Family Medicine

## 2022-03-04 DIAGNOSIS — L309 Dermatitis, unspecified: Secondary | ICD-10-CM

## 2022-03-04 MED ORDER — AZELASTINE HCL 0.1 % NA SOLN
2.0000 | Freq: Two times a day (BID) | NASAL | 3 refills | Status: DC
Start: 2022-03-04 — End: 2022-10-13

## 2022-03-04 MED ORDER — BETAMETHASONE VALERATE 0.1 % EX LOTN: 1.0000 "application " | TOPICAL_LOTION | Freq: Two times a day (BID) | CUTANEOUS | 0 refills | Status: AC

## 2022-03-23 DIAGNOSIS — J069 Acute upper respiratory infection, unspecified: Secondary | ICD-10-CM | POA: Diagnosis not present

## 2022-03-23 DIAGNOSIS — Z20822 Contact with and (suspected) exposure to covid-19: Secondary | ICD-10-CM | POA: Diagnosis not present

## 2022-03-31 ENCOUNTER — Encounter: Payer: Self-pay | Admitting: Internal Medicine

## 2022-04-13 DIAGNOSIS — H538 Other visual disturbances: Secondary | ICD-10-CM | POA: Diagnosis not present

## 2022-04-15 ENCOUNTER — Ambulatory Visit (INDEPENDENT_AMBULATORY_CARE_PROVIDER_SITE_OTHER)
Admission: RE | Admit: 2022-04-15 | Discharge: 2022-04-15 | Disposition: A | Discharge status: Home / Self Care | Payer: Medicare Other | Source: Ambulatory Visit | Attending: Family Medicine | Admitting: Family Medicine

## 2022-04-15 DIAGNOSIS — E782 Mixed hyperlipidemia: Secondary | ICD-10-CM

## 2022-04-27 ENCOUNTER — Encounter: Payer: Self-pay | Admitting: Internal Medicine

## 2022-05-15 ENCOUNTER — Ambulatory Visit (AMBULATORY_SURGERY_CENTER): Payer: Self-pay | Admitting: *Deleted

## 2022-05-15 VITALS — Ht 65.5 in | Wt 169.6 lb

## 2022-05-15 DIAGNOSIS — Z800 Family history of malignant neoplasm of digestive organs: Secondary | ICD-10-CM

## 2022-05-15 NOTE — Progress Notes (Signed)
No egg or soy allergy known to patient  No issues known to pt with past sedation with any surgeries or procedures Patient denies ever being told they had issues or difficulty with intubation  No FH of Malignant Hyperthermia Pt is not on diet pills Pt is not on  home 02  Pt is not on blood thinners  Pt denies issues with constipation  No A fib or A flutter Have any cardiac testing pending--  Pt. "Stated during pre-visit that she does not have constipation and that she was seen in ED and they gave her  pain medication  and she had constipation from that and that was the only time she had a issue".   Pt.instructed on over the counter items to purchase for prep.

## 2022-06-12 ENCOUNTER — Encounter: Payer: Self-pay | Admitting: Certified Registered Nurse Anesthetist

## 2022-06-15 ENCOUNTER — Encounter: Payer: Self-pay | Admitting: Gastroenterology

## 2022-06-19 ENCOUNTER — Ambulatory Visit (AMBULATORY_SURGERY_CENTER): Payer: Medicare Other | Admitting: Internal Medicine

## 2022-06-19 ENCOUNTER — Encounter: Payer: Self-pay | Admitting: Internal Medicine

## 2022-06-19 VITALS — BP 144/76 | HR 72 | Temp 97.5°F | Resp 13 | Ht 65.5 in | Wt 169.6 lb

## 2022-06-19 DIAGNOSIS — Z1211 Encounter for screening for malignant neoplasm of colon: Secondary | ICD-10-CM

## 2022-06-19 DIAGNOSIS — Z800 Family history of malignant neoplasm of digestive organs: Secondary | ICD-10-CM | POA: Diagnosis not present

## 2022-06-19 MED ORDER — SODIUM CHLORIDE 0.9 % IV SOLN: 500.0000 mL | Freq: Once | INTRAVENOUS | Status: DC

## 2022-06-19 NOTE — Progress Notes (Signed)
Report given to PACU, vss 

## 2022-06-19 NOTE — Progress Notes (Signed)
Pt's states no medical or surgical changes since previsit or office visit. 

## 2022-06-19 NOTE — Patient Instructions (Addendum)
Normal exam again  Your next routine colonoscopy should be in 5 years - 2028.  I appreciate the opportunity to care for you. Gatha Mayer, MD, FACG  YOU HAD AN ENDOSCOPIC PROCEDURE TODAY AT [facility]:   Refer to the procedure report that was given to you for any specific questions about what was found during the examination.  If the procedure report does not answer your questions, please call your gastroenterologist to clarify.  If you requested that your care partner not be given the details of your procedure findings, then the procedure report has been included in a sealed envelope for you to review at your convenience later.  YOU SHOULD EXPECT: Some feelings of bloating in the abdomen. Passage of more gas than usual.  Walking can help get rid of the air that was put into your GI tract during the procedure and reduce the bloating. If you had a lower endoscopy (such as a colonoscopy or flexible sigmoidoscopy) you may notice spotting of blood in your stool or on the toilet paper. If you underwent a bowel prep for your procedure, you may not have a normal bowel movement for a few days.  Please Note:  You might notice some irritation and congestion in your nose or some drainage.  This is from the oxygen used during your procedure.  There is no need for concern and it should clear up in a day or so.  SYMPTOMS TO REPORT IMMEDIATELY:  Following lower endoscopy (colonoscopy or flexible sigmoidoscopy):  Excessive amounts of blood in the stool  Significant tenderness or worsening of abdominal pains  Swelling of the abdomen that is new, acute  Fever of 100F or higher   For urgent or emergent issues, a gastroenterologist can be reached at any hour by calling [phone]. Do not use MyChart messaging for urgent concerns.    DIET:  We do recommend a small meal at first, but then you may proceed to your regular diet.  Drink plenty of fluids but you should avoid alcoholic  beverages for 24 hours.  ACTIVITY:  You should plan to take it easy for the rest of today and you should NOT DRIVE or use heavy machinery until tomorrow (because of the sedation medicines used during the test).    FOLLOW UP: Our staff will call the number listed on your records the next business day following your procedure.  We will call around 7:15- 8:00 am to check on you and address any questions or concerns that you may have regarding the information given to you following your procedure. If we do not reach you, we will leave a message.  If you develop any symptoms (ie: fever, flu-like symptoms, shortness of breath, cough etc.) before then, please call [phone].  If you test positive for Covid 19 in the 2 weeks post procedure, please call and report this information to [place].    If any biopsies were taken you will be contacted by phone or by letter within the next 1-3 weeks.  Please call us at [phone] if you have not heard about the biopsies in 3 weeks.    SIGNATURES/CONFIDENTIALITY: You and/or your care partner have signed paperwork which will be entered into your electronic medical record.  These signatures attest to the fact that that the information above on your After Visit Summary has been reviewed and is understood.  Full responsibility of the confidentiality of this discharge information lies with you and/or your care-partner.

## 2022-06-19 NOTE — Progress Notes (Signed)
[facility] History and Physical   Primary Care Physician:  Caren Macadam, MD (Inactive)   Reason for Procedure:   Family history of colon cancer  Plan:    colonoscopy     HPI: Ellen Collins is a 71 y.o. female with family hx colon cancer   Past Medical History:  Diagnosis Date   Allergy    Anxiety    Arthritis    LEGS   Cancer ([place])    SKIN   Cataract    BEGINNING   GERD (gastroesophageal reflux disease)    Low back pain radiating to both legs    worse of left side   Paroxysmal SVT (supraventricular tachycardia) ([place])    she reports rare problems over years    Past Surgical History:  Procedure Laterality Date   broken foot     right foot/ has pins   COLONOSCOPY     UPPER GASTROINTESTINAL ENDOSCOPY     VAGINAL HYSTERECTOMY  1998    Prior to Admission medications   Medication Sig Start Date End Date Taking? Authorizing Provider  azelastine (ASTELIN) 0.1 % nasal spray Place 2 sprays into both nostrils 2 (two) times daily. Use in each nostril as directed 03/04/22  Yes Koberlein, Junell C, MD  estradiol (ESTRACE) 1 MG tablet Take 1 tablet (1 mg total) by mouth daily. 03/02/22  Yes Koberlein, Junell C, MD  fluticasone (FLONASE) 50 MCG/ACT nasal spray Place 2 sprays into both nostrils daily. 03/02/22  Yes Koberlein, Junell C, MD  loratadine (CLARITIN) 10 MG tablet Take 5 mg by mouth daily. allergies   Yes [provider]  Multiple Vitamins-Minerals (MULTIVITAMIN WITH MINERALS) tablet Take 1 tablet by mouth daily.   Yes [provider]  NON FORMULARY Viviscal hair vitamin-Take one daily   Yes [provider]  Turmeric 500 MG TABS Take 500 mg by mouth daily. 10/24/18  Yes Koberlein, Steele Berg, MD  vitamin C (ASCORBIC ACID) 500 MG tablet Take 500 mg by mouth daily.   Yes [provider]  ALPRAZolam Duanne Moron) 0.5 MG tablet Take 0.5-1 tablets (0.25-0.5 mg total) by mouth as needed for anxiety. Patient not taking: Reported on  06/19/2022 03/02/22   Caren Macadam, MD  betamethasone valerate lotion (VALISONE) 0.1 % Apply 1 application. topically 2 (two) times daily. Patient taking differently: Apply 1 application  topically as needed. 03/04/22   Caren Macadam, MD  traZODone (DESYREL) 50 MG tablet TAKE 1 TABLET BY MOUTH EVERYDAY AT BEDTIME Patient not taking: Reported on 05/15/2022 12/31/21   Caren Macadam, MD  zolpidem (AMBIEN) 5 MG tablet Take 1 tablet (5 mg total) by mouth at bedtime as needed for sleep. Patient not taking: Reported on 06/19/2022 03/02/22   Caren Macadam, MD    Current Outpatient Medications  Medication Sig Dispense Refill   azelastine (ASTELIN) 0.1 % nasal spray Place 2 sprays into both nostrils 2 (two) times daily. Use in each nostril as directed 30 mL 3   estradiol (ESTRACE) 1 MG tablet Take 1 tablet (1 mg total) by mouth daily. 90 tablet 3   fluticasone (FLONASE) 50 MCG/ACT nasal spray Place 2 sprays into both nostrils daily. 16 g 3   loratadine (CLARITIN) 10 MG tablet Take 5 mg by mouth daily. allergies     Multiple Vitamins-Minerals (MULTIVITAMIN WITH MINERALS) tablet Take 1 tablet by mouth daily.     NON FORMULARY Viviscal hair vitamin-Take one daily     Turmeric 500 MG TABS Take 500  mg by mouth daily.     vitamin C (ASCORBIC ACID) 500 MG tablet Take 500 mg by mouth daily.     ALPRAZolam (XANAX) 0.5 MG tablet Take 0.5-1 tablets (0.25-0.5 mg total) by mouth as needed for anxiety. (Patient not taking: Reported on 06/19/2022) 30 tablet 0   betamethasone valerate lotion (VALISONE) 0.1 % Apply 1 application. topically 2 (two) times daily. (Patient taking differently: Apply 1 application  topically as needed.) 60 mL 0   traZODone (DESYREL) 50 MG tablet TAKE 1 TABLET BY MOUTH EVERYDAY AT BEDTIME (Patient not taking: Reported on 05/15/2022) 90 tablet 1   zolpidem (AMBIEN) 5 MG tablet Take 1 tablet (5 mg total) by mouth at bedtime as needed for sleep. (Patient not taking: Reported on  06/19/2022) 90 tablet 3   Current Facility-Administered Medications  Medication Dose Route Frequency Provider Last Rate Last Admin   0.9 %  sodium chloride infusion  500 mL Intravenous Continuous Gatha Mayer, MD       0.9 %  sodium chloride infusion  500 mL Intravenous Once Gatha Mayer, MD        Allergies as of 06/19/2022 - Review Complete 06/19/2022  Allergen Reaction Noted   Latex      Family History  Problem Relation Age of Onset   Ovarian cancer Mother    Colon polyps Father    Colon cancer Father 24   Crohn's disease Neg Hx    Esophageal cancer Neg Hx    Rectal cancer Neg Hx    Stomach cancer Neg Hx     Social History   Socioeconomic History   Marital status: Married    Spouse name: Not on file   Number of children: Not on file   Years of education: Not on file   Highest education level: Not on file  Occupational History   Not on file  Tobacco Use   Smoking status: Never    Passive exposure: Never   Smokeless tobacco: Never  Vaping Use   Vaping Use: Never used  Substance and Sexual Activity   Alcohol use: Yes    Alcohol/week: 1.0 standard drink of alcohol    Types: 1 Glasses of wine per week    Comment: social   Drug use: No   Sexual activity: Not on file  Other Topics Concern   Not on file  Social History Narrative   Not on file   Social Determinants of Health   Financial Resource Strain: Low Risk  (01/19/2022)   Overall Financial Resource Strain (CARDIA)    Difficulty of Paying Living Expenses: Not hard at all  Food Insecurity: No Food Insecurity (01/19/2022)   Hunger Vital Sign    Worried About Running Out of Food in the Last Year: Never true    Ran Out of Food in the Last Year: Never true  Transportation Needs: No Transportation Needs (01/19/2022)   PRAPARE - Hydrologist (Medical): No    Lack of Transportation (Non-Medical): No  Physical Activity: Sufficiently Active (01/19/2022)   Exercise Vital Sign    Days  of Exercise per Week: 5 days    Minutes of Exercise per Session: 60 min  Stress: Stress Concern Present (01/19/2022)   [place]    Feeling of Stress : To some extent  Social Connections: Socially Integrated (01/06/2021)   Social Connection and Isolation Panel [NHANES]    Frequency of Communication with Friends and Family:  More than three times a week    Frequency of Social Gatherings with Friends and Family: Twice a week    Attends Religious Services: 1 to 4 times per year    Active Member of [employer] or Organizations: Yes    Attends Archivist Meetings: 1 to 4 times per year    Marital Status: Married  Human resources officer Violence: Not At Risk (01/06/2021)   Humiliation, Afraid, Rape, and Kick questionnaire    Fear of Current or Ex-Partner: No    Emotionally Abused: No    Physically Abused: No    Sexually Abused: No    Review of Systems:  All other review of systems negative except as mentioned in the HPI.  Physical Exam: Vital signs BP (!) 142/86   Pulse 81   Temp (!) 97.5 F (36.4 C)   Resp 13   Ht 5' 5.5" (1.664 m)   Wt 169 lb 9.6 oz (76.9 kg)   SpO2 99%   BMI 27.79 kg/m   General:   Alert,  Well-developed, well-nourished, pleasant and cooperative in NAD Lungs:  Clear throughout to auscultation.   Heart:  Regular rate and rhythm; no murmurs, clicks, rubs,  or gallops. Abdomen:  Soft, nontender and nondistended. Normal bowel sounds.   Neuro/Psych:  Alert and cooperative. Normal mood and affect. A and O x 3   '@Lilyrose Tanney'$  Simonne Maffucci, MD, [facility] [phone] (pager) 06/19/2022 9:58 AM@

## 2022-06-19 NOTE — Op Note (Signed)
[facility] Patient Name: Ellen Collins Procedure Date: 06/19/2022 9:55 AM MRN: [id number] Endoscopist: Gatha Mayer , MD Age: 71 Referring MD:  Date of Birth: 1951-08-04 Gender: Female Account #: [id number] Procedure:                Colonoscopy Indications:              Screening in patient at increased risk: Family                            history of 1st-degree relative with colorectal                            cancer Medicines:                Monitored Anesthesia Care Procedure:                Pre-Anesthesia Assessment:                           - Prior to the procedure, a History and Physical                            was performed, and patient medications and                            allergies were reviewed. The patient's tolerance of                            previous anesthesia was also reviewed. The risks                            and benefits of the procedure and the sedation                            options and risks were discussed with the patient.                            All questions were answered, and informed consent                            was obtained. Prior Anticoagulants: The patient has                            taken no previous anticoagulant or antiplatelet                            agents. ASA Grade Assessment: II - A patient with                            mild systemic disease. After reviewing the risks                            and benefits, the patient was deemed in  satisfactory condition to undergo the procedure.                           After obtaining informed consent, the colonoscope                            was passed under direct vision. Throughout the                            procedure, the patient's blood pressure, pulse, and                            oxygen saturations were monitored continuously. The                            Olympus PCF-H190DL (#[id number]) Colonoscope was                             introduced through the anus and advanced to the the                            cecum, identified by appendiceal orifice and                            ileocecal valve. The colonoscopy was performed                            without difficulty. The patient tolerated the                            procedure well. The quality of the bowel                            preparation was good. The ileocecal valve,                            appendiceal orifice, and rectum were photographed.                            The bowel preparation used was Miralax via split                            dose instruction. Scope In: 10:07:07 AM Scope Out: 10:19:43 AM Scope Withdrawal Time: 0 hours 9 minutes 42 seconds  Total Procedure Duration: 0 hours 12 minutes 36 seconds  Findings:                 The perianal and digital rectal examinations were                            normal.                           The entire examined colon appeared normal on direct  and retroflexion views. Complications:            No immediate complications. Estimated Blood Loss:     Estimated blood loss: none. Impression:               - The entire examined colon is normal on direct and                            retroflexion views.                           - No specimens collected. Recommendation:           - Patient has a contact number available for                            emergencies. The signs and symptoms of potential                            delayed complications were discussed with the                            patient. Return to normal activities tomorrow.                            Written discharge instructions were provided to the                            patient.                           - Resume previous diet.                           - Continue present medications.                           - Repeat colonoscopy in 5 years. Gatha Mayer, MD 06/19/2022 10:27:57  AM This report has been signed electronically.

## 2022-06-22 ENCOUNTER — Telehealth: Payer: Self-pay | Admitting: *Deleted

## 2022-06-22 NOTE — Telephone Encounter (Signed)
No answer for post procedure call back. Left VM. 

## 2022-06-26 ENCOUNTER — Other Ambulatory Visit: Payer: Self-pay | Admitting: *Deleted

## 2022-06-26 DIAGNOSIS — G4700 Insomnia, unspecified: Secondary | ICD-10-CM

## 2022-06-26 MED ORDER — TRAZODONE HCL 50 MG PO TABS: ORAL_TABLET | ORAL | 0 refills | Status: DC

## 2022-06-26 NOTE — Telephone Encounter (Signed)
I will approve but she needs an appt in October

## 2022-08-17 DIAGNOSIS — H25813 Combined forms of age-related cataract, bilateral: Secondary | ICD-10-CM | POA: Diagnosis not present

## 2022-09-09 DIAGNOSIS — H25811 Combined forms of age-related cataract, right eye: Secondary | ICD-10-CM | POA: Diagnosis not present

## 2022-09-09 DIAGNOSIS — H2511 Age-related nuclear cataract, right eye: Secondary | ICD-10-CM | POA: Diagnosis not present

## 2022-09-09 DIAGNOSIS — H25812 Combined forms of age-related cataract, left eye: Secondary | ICD-10-CM | POA: Diagnosis not present

## 2022-09-23 DIAGNOSIS — H2512 Age-related nuclear cataract, left eye: Secondary | ICD-10-CM | POA: Diagnosis not present

## 2022-09-23 DIAGNOSIS — H25812 Combined forms of age-related cataract, left eye: Secondary | ICD-10-CM | POA: Diagnosis not present

## 2022-09-30 ENCOUNTER — Other Ambulatory Visit: Payer: Self-pay | Admitting: Family Medicine

## 2022-09-30 DIAGNOSIS — G4700 Insomnia, unspecified: Secondary | ICD-10-CM

## 2022-10-13 ENCOUNTER — Encounter: Payer: Self-pay | Admitting: Family Medicine

## 2022-10-13 ENCOUNTER — Ambulatory Visit (INDEPENDENT_AMBULATORY_CARE_PROVIDER_SITE_OTHER): Payer: Medicare Other | Admitting: Family Medicine

## 2022-10-13 VITALS — BP 140/92 | HR 90 | Temp 97.9°F | Ht 65.5 in | Wt 168.0 lb

## 2022-10-13 DIAGNOSIS — E2839 Other primary ovarian failure: Secondary | ICD-10-CM | POA: Diagnosis not present

## 2022-10-13 DIAGNOSIS — J309 Allergic rhinitis, unspecified: Secondary | ICD-10-CM | POA: Diagnosis not present

## 2022-10-13 MED ORDER — AZELASTINE HCL 0.1 % NA SOLN: 2.0000 | Freq: Two times a day (BID) | NASAL | 3 refills | Status: DC

## 2022-10-13 MED ORDER — FLUTICASONE PROPIONATE 50 MCG/ACT NA SUSP: 2.0000 | Freq: Every day | NASAL | 3 refills | Status: DC

## 2022-10-13 MED ORDER — ESTRADIOL 1 MG PO TABS: 1.0000 mg | ORAL_TABLET | Freq: Every day | ORAL | 3 refills | Status: DC

## 2022-10-13 NOTE — Assessment & Plan Note (Signed)
Year- round, stable on the flonase 1 spray per nostril daily and the azelastine 1 spray per nostril daily. Will refill these for the patient.

## 2022-10-13 NOTE — Progress Notes (Signed)
Established Patient Office Visit  Subjective   Patient ID: Ellen Collins, female    DOB: [birth year]  Age: 71 y.o. MRN: [id number]  Chief Complaint  Patient presents with   Establish Care    Patient is here for transition of care visit.   Pt reports that she often has elevated blood pressure when she comes to to doctor's office. She reports she recently had cataract surgery last month, states that she has had some complications from procedure, states that she is going back to her eye doctor soon. States that it has caused a lot of stress and anxiety this month. Pt reports that her BP at home and she usually gets all numbers under 140. States that she checks it daily, this morning it was 160 systolic.   Insomnia-- pt reports she is taking the trazodone 50 mg at night, states she is only taking it as needed, states that she is also only taking the [place] as needed, hasn't used much of it, is reporting an increase in her anxiety so has been taking 1/2 tablet of the alprazolam during the day but just in the last month or so.   Pt reports that she needs refills on her nasal sprays and her estradiol 1 mg daily. Reports she had a hysterectomy in her 36's due to a family history of ovarian cancer. Reports that she has continued this medication daily without any side effects or other issues.    Current Outpatient Medications  Medication Instructions   ALPRAZolam (XANAX) 0.25-0.5 mg, Oral, As needed   ascorbic acid (VITAMIN C) 500 mg, Oral, Daily   azelastine (ASTELIN) 0.1 % nasal spray 2 sprays, Each Nare, 2 times daily, Use in each nostril as directed   betamethasone valerate lotion (VALISONE) 0.1 % 1 application , Topical, 2 times daily   estradiol (ESTRACE) 1 mg, Oral, Daily   famotidine (PEPCID) 20 mg, Oral, Daily   fluticasone (FLONASE) 50 MCG/ACT nasal spray 2 sprays, Each Nare, Daily   loratadine (CLARITIN) 5 mg, Oral, Daily, allergies    Multiple Vitamins-Minerals (MULTIVITAMIN WITH  MINERALS) tablet 1 tablet, Oral, Daily   NON FORMULARY Viviscal hair vitamin-Take one daily    OVER THE COUNTER MEDICATION Viviscal hair vitamin   Probiotic Product (PROBIOTIC PO) Oral, Daily   traZODone (DESYREL) 50 MG tablet TAKE 1 TABLET BY MOUTH EVERYDAY AT BEDTIME   Turmeric 500 mg, Oral, Daily   zolpidem (AMBIEN) 5 mg, Oral, At bedtime PRN    Patient Active Problem List   Diagnosis Date Noted   Estrogen deficiency 10/13/2022   Basal cell carcinoma 10/24/2018   Mixed hyperlipidemia 11/13/2016   Insomnia 11/13/2016   Lumbar disc disease with radiculopathy 08/14/2014   Back pain 04/12/2013   NECK PAIN 01/09/2010   PANIC DISORDER 07/19/2009   DYSPHAGIA 06/17/2009   Anxiety state 11/16/2008   Allergic rhinitis 11/16/2008      Review of Systems  All other systems reviewed and are negative.     Objective:     BP (!) 140/92 (BP Location: Left Arm, Patient Position: Sitting, Cuff Size: Normal)   Pulse 90   Temp 97.9 F (36.6 C) (Oral)   Ht 5' 5.5" (1.664 m)   Wt 168 lb (76.2 kg)   SpO2 98%   BMI 27.53 kg/m  BP Readings from Last 3 Encounters:  10/13/22 (!) 140/92  06/19/22 (!) 144/76  03/02/22 140/76      Physical Exam Vitals reviewed.  Constitutional:  Appearance: Normal appearance. She is well-groomed and normal weight.  Eyes:     Conjunctiva/sclera: Conjunctivae normal.  Neck:     Thyroid: No thyromegaly.  Cardiovascular:     Rate and Rhythm: Normal rate and regular rhythm.     Pulses: Normal pulses.     Heart sounds: S1 normal and S2 normal. Murmur (small 2/6 SEM at the RUSB) heard.  Pulmonary:     Effort: Pulmonary effort is normal.     Breath sounds: Normal breath sounds and air entry.  Neurological:     Mental Status: She is alert and oriented to person, place, and time. Mental status is at baseline.     Gait: Gait is intact.  Psychiatric:        Mood and Affect: Mood and affect normal.        Speech: Speech normal.        Behavior:  Behavior normal.        Judgment: Judgment normal.      No results found for any visits on 10/13/22.    The 10-year ASCVD risk score (Arnett DK, et al., 2019) is: 9.8%    Assessment & Plan:   Problem List Items Addressed This Visit       Unprioritized   Allergic rhinitis    Year- round, stable on the flonase 1 spray per nostril daily and the azelastine 1 spray per nostril daily. Will refill these for the patient.       Relevant Medications   fluticasone (FLONASE) 50 MCG/ACT nasal spray   azelastine (ASTELIN) 0.1 % nasal spray   Estrogen deficiency - Primary (Chronic)    Well controlled symptoms on the estradiol 1 mg daily, will continue this for patient.       Relevant Medications   estradiol (ESTRACE) 1 MG tablet    Return in about 5 months (around 03/14/2023) for Annual Physical Exam.    Farrel Conners, MD

## 2022-10-13 NOTE — Assessment & Plan Note (Signed)
Well controlled symptoms on the estradiol 1 mg daily, will continue this for patient.

## 2022-10-28 DIAGNOSIS — H04123 Dry eye syndrome of bilateral lacrimal glands: Secondary | ICD-10-CM | POA: Diagnosis not present

## 2022-10-31 DIAGNOSIS — Z961 Presence of intraocular lens: Secondary | ICD-10-CM | POA: Diagnosis not present

## 2022-10-31 DIAGNOSIS — H04123 Dry eye syndrome of bilateral lacrimal glands: Secondary | ICD-10-CM | POA: Diagnosis not present

## 2022-11-16 DIAGNOSIS — H43393 Other vitreous opacities, bilateral: Secondary | ICD-10-CM | POA: Diagnosis not present

## 2022-11-16 DIAGNOSIS — Z961 Presence of intraocular lens: Secondary | ICD-10-CM | POA: Diagnosis not present

## 2022-11-16 DIAGNOSIS — H26493 Other secondary cataract, bilateral: Secondary | ICD-10-CM | POA: Diagnosis not present

## 2022-11-17 DIAGNOSIS — H26491 Other secondary cataract, right eye: Secondary | ICD-10-CM | POA: Diagnosis not present

## 2022-11-17 DIAGNOSIS — T859XXA Unspecified complication of internal prosthetic device, implant and graft, initial encounter: Secondary | ICD-10-CM | POA: Diagnosis not present

## 2022-11-17 DIAGNOSIS — H59092 Other disorders of the left eye following cataract surgery: Secondary | ICD-10-CM | POA: Diagnosis not present

## 2022-12-07 ENCOUNTER — Telehealth: Payer: Medicare Other

## 2022-12-16 DIAGNOSIS — Z1231 Encounter for screening mammogram for malignant neoplasm of breast: Secondary | ICD-10-CM | POA: Diagnosis not present

## 2022-12-16 LAB — HM MAMMOGRAPHY

## 2022-12-25 ENCOUNTER — Other Ambulatory Visit: Payer: Self-pay | Admitting: Family Medicine

## 2022-12-25 DIAGNOSIS — G4700 Insomnia, unspecified: Secondary | ICD-10-CM

## 2023-01-06 ENCOUNTER — Other Ambulatory Visit: Payer: Self-pay | Admitting: Family Medicine

## 2023-01-06 DIAGNOSIS — J309 Allergic rhinitis, unspecified: Secondary | ICD-10-CM

## 2023-01-08 ENCOUNTER — Other Ambulatory Visit: Payer: Self-pay | Admitting: *Deleted

## 2023-01-08 DIAGNOSIS — G4700 Insomnia, unspecified: Secondary | ICD-10-CM

## 2023-01-08 DIAGNOSIS — F411 Generalized anxiety disorder: Secondary | ICD-10-CM

## 2023-01-11 MED ORDER — ALPRAZOLAM 0.5 MG PO TABS: 0.2500 mg | ORAL_TABLET | ORAL | 0 refills | Status: DC | PRN

## 2023-01-11 MED ORDER — TRAZODONE HCL 50 MG PO TABS: ORAL_TABLET | ORAL | 1 refills | Status: DC

## 2023-01-12 ENCOUNTER — Other Ambulatory Visit: Payer: Self-pay | Admitting: *Deleted

## 2023-01-12 MED ORDER — ZOLPIDEM TARTRATE 5 MG PO TABS
5.0000 mg | ORAL_TABLET | Freq: Every evening | ORAL | 0 refills | Status: DC | PRN
Start: 2023-01-12 — End: 2023-05-20

## 2023-01-20 ENCOUNTER — Telehealth: Payer: Self-pay | Admitting: Family Medicine

## 2023-01-20 NOTE — Telephone Encounter (Signed)
[facility] to schedule their annual wellness visit. Appointment made for 02/02/23.  [facility] direct phone # [phone]   Spoke to patient to r/s her 3/18 appt schedule change   r/s to 02/02/23

## 2023-01-25 ENCOUNTER — Ambulatory Visit: Payer: Medicare Other

## 2023-02-02 ENCOUNTER — Encounter: Payer: Self-pay | Admitting: Family Medicine

## 2023-02-02 ENCOUNTER — Ambulatory Visit (INDEPENDENT_AMBULATORY_CARE_PROVIDER_SITE_OTHER): Payer: Medicare Other | Admitting: Family Medicine

## 2023-02-02 DIAGNOSIS — Z0000 Encounter for general adult medical examination without abnormal findings: Secondary | ICD-10-CM | POA: Diagnosis not present

## 2023-02-02 NOTE — Progress Notes (Signed)
PATIENT CHECK-IN and HEALTH RISK ASSESSMENT QUESTIONNAIRE:  -completed by phone/video for upcoming Medicare Preventive Visit  Pre-Visit Check-in: 1)Vitals (height, wt, BP, etc) - record in vitals section for visit on day of visit 2)Review and Update Medications, Allergies PMH, Surgeries, Social history in Epic 3)Hospitalizations in the last year with date/reason?   4)Review and Update Care Team (patient's specialists) in Epic 5) Complete PHQ9 in Epic  6) Complete Fall Screening in Epic 7)Review all Health Maintenance Due and order under PCP if not done.  8)Medicare Wellness Questionnaire: Answer theses question about your habits: Do you drink alcohol? Yes, socially with  If yes, how many drinks do you have a day? Less than 1 drink when she drinks Have you ever smoked?no  How many packs a day do/did you smoke?  Do you use smokeless tobacco?no Do you use an illicit drugs?no Do you exercises? Yes IF so, what type and how many days/minutes per week?walking, at least 5 days a week outside, at least 10,000 steps per day Are you sexually active? Yes Number of partners?1 Eats lots of fruits and veggies Typical breakfast:Cereal or toast Typical lunch-salad Typical dinner-sandwich  Typical snacks:cheese and cracjkers  Beverages: Decaf iced tea and water  Answer theses question about you: Can you perform most household chores?yes Do you find it hard to follow a conversation in a noisy room?no Do you often ask people to speak up or repeat themselves?no Do you feel that you have a problem with memory?occasionally - but feels is normal Do you balance your checkbook and or bank acounts?yes Do you feel safe at home?yes Last dentist visit? 2 months ago Do you need assistance with any of the following: Please note if so   Driving? no  Feeding yourself? no  Getting from bed to chair? no  Getting to the [place] or showering?no   Dressing yourself?no   Managing money?no  Climbing  a flight of stairs-no  Preparing meals? no  Do you have Advanced Directives in place (Living Will, Healthcare Power or [name])? yes   Last eye Exam and location? February 2024   Do you currently use prescribed or non-prescribed narcotic or opioid pain medications?no  Do you have a history or close family history of breast, ovarian, tubal or peritoneal cancer or a family member with BRCA (breast cancer susceptibility 1 and 2) gene mutations? Mother had ovarian cancer - she and her sister had hysterectomy and oophorectomy.   Nurse/Assistant Credentials/time stamp: Luellen Pucker   ----------------------------------------------------------------------------------------------------------------------------------------------------------------------------------------------------------------------   MEDICARE ANNUAL PREVENTIVE VISIT WITH PROVIDER: (Welcome to Medicare, initial annual wellness or annual wellness exam)  Virtual Visit via Video Note  I connected with Ellen Collins on 02/02/23 by a video enabled telemedicine application and verified that I am speaking with the correct person using two identifiers.  Location patient: home Location provider:work or home office Persons participating in the virtual visit: patient, provider  Concerns and/or follow up today: Reports is doing well - had to have cataract surgery recently. Now doing well.    See HM section in Epic for other details of completed HM.    ROS: negative for report of fevers, unintentional weight loss, vision loss, hearing loss or change, chest pain, sob, hemoptysis, melena, hematochezia, hematuria, falls, bleeding or bruising, loc, thoughts of suicide or self harm, memory loss  Patient-completed extensive health risk assessment - reviewed and discussed with the patient: See Health Risk Assessment completed with patient prior to the visit either above or in recent  phone note. This was reviewed in detailed with the  patient today and appropriate recommendations, orders and referrals were placed as needed per Summary below and patient instructions.   Review of Medical History: -PMH, PSH, Family History and current specialty and care providers reviewed and updated and listed below   Patient Care Team: Farrel Conners, MD as PCP - General (Family Medicine) Martinique, Amy, MD as Consulting Physician (Dermatology) Janyth Contes, MD as Consulting Physician (Obstetrics and Gynecology) Viona Gilmore, [facility] (Inactive) as Pharmacist (Pharmacist)   Past Medical History:  Diagnosis Date   Allergy    Anxiety    Arthritis    LEGS   Cancer ([place])    SKIN   Cataract    BEGINNING   GERD (gastroesophageal reflux disease)    Low back pain radiating to both legs    worse of left side   Paroxysmal SVT (supraventricular tachycardia)    she reports rare problems over years    Past Surgical History:  Procedure Laterality Date   broken foot     right foot/ has pins   COLONOSCOPY     UPPER GASTROINTESTINAL ENDOSCOPY     VAGINAL HYSTERECTOMY  1998    Social History   Socioeconomic History   Marital status: Married    Spouse name: Not on file   Number of children: Not on file   Years of education: Not on file   Highest education level: Not on file  Occupational History   Not on file  Tobacco Use   Smoking status: Never    Passive exposure: Never   Smokeless tobacco: Never  Vaping Use   Vaping Use: Never used  Substance and Sexual Activity   Alcohol use: Yes    Alcohol/week: 1.0 standard drink of alcohol    Types: 1 Glasses of wine per week    Comment: social   Drug use: No   Sexual activity: Not on file  Other Topics Concern   Not on file  Social History Narrative   Not on file   Social Determinants of Health   Financial Resource Strain: Low Risk  (01/19/2022)   Overall Financial Resource Strain (CARDIA)    Difficulty of Paying Living Expenses: Not hard at all  Food Insecurity:  No Food Insecurity (01/19/2022)   Hunger Vital Sign    Worried About Running Out of Food in the Last Year: Never true    Ran Out of Food in the Last Year: Never true  Transportation Needs: No Transportation Needs (01/19/2022)   PRAPARE - Hydrologist (Medical): No    Lack of Transportation (Non-Medical): No  Physical Activity: Sufficiently Active (01/19/2022)   Exercise Vital Sign    Days of Exercise per Week: 5 days    Minutes of Exercise per Session: 60 min  Stress: Stress Concern Present (01/19/2022)   [place]    Feeling of Stress : To some extent  Social Connections: Socially Integrated (01/06/2021)   Social Connection and Isolation Panel [NHANES]    Frequency of Communication with Friends and Family: More than three times a week    Frequency of Social Gatherings with Friends and Family: Twice a week    Attends Religious Services: 1 to 4 times per year    Active Member of [employer] or Organizations: Yes    Attends Archivist Meetings: 1 to 4 times per year    Marital Status: Married  Intimate  Partner Violence: Not At Risk (01/06/2021)   Humiliation, Afraid, Rape, and Kick questionnaire    Fear of Current or Ex-Partner: No    Emotionally Abused: No    Physically Abused: No    Sexually Abused: No    Family History  Problem Relation Age of Onset   Ovarian cancer Mother    Colon polyps Father    Colon cancer Father 64   Crohn's disease Neg Hx    Esophageal cancer Neg Hx    Rectal cancer Neg Hx    Stomach cancer Neg Hx     Current Outpatient Medications on File Prior to Visit  Medication Sig Dispense Refill   ALPRAZolam (XANAX) 0.5 MG tablet Take 0.5-1 tablets (0.25-0.5 mg total) by mouth as needed for anxiety. 10 tablet 0   azelastine (ASTELIN) 0.1 % nasal spray Place 2 sprays into both nostrils 2 (two) times daily. Use in each nostril as directed 30 mL 3   betamethasone  valerate lotion (VALISONE) 0.1 % Apply 1 application. topically 2 (two) times daily. (Patient taking differently: Apply 1 application  topically as needed.) 60 mL 0   estradiol (ESTRACE) 1 MG tablet Take 1 tablet (1 mg total) by mouth daily. 90 tablet 3   famotidine (PEPCID) 20 MG tablet Take 20 mg by mouth daily.     fluticasone (FLONASE) 50 MCG/ACT nasal spray USE 2 SPRAYS IN BOTH NOSTRILS  DAILY 64 g 1   loratadine (CLARITIN) 10 MG tablet Take 5 mg by mouth daily. allergies     Multiple Vitamins-Minerals (MULTIVITAMIN WITH MINERALS) tablet Take 1 tablet by mouth daily.     NON FORMULARY Viviscal hair vitamin-Take one daily     Probiotic Product (PROBIOTIC PO) Take by mouth daily.     traZODone (DESYREL) 50 MG tablet 1 tablet daily at bedtime as needed. 90 tablet 1   Turmeric 500 MG TABS Take 500 mg by mouth daily.     vitamin C (ASCORBIC ACID) 500 MG tablet Take 500 mg by mouth daily.     zolpidem (AMBIEN) 5 MG tablet Take 1 tablet (5 mg total) by mouth at bedtime as needed for sleep. 10 tablet 0   Current Facility-Administered Medications on File Prior to Visit  Medication Dose Route Frequency Provider Last Rate Last Admin   0.9 %  sodium chloride infusion  500 mL Intravenous Continuous Gatha Mayer, MD        Allergies  Allergen Reactions   Latex     Itching, and redness       Physical Exam There were no vitals filed for this visit. Estimated body mass index is 27.53 kg/m as calculated from the following:   Height as of 10/13/22: 5' 5.5" (1.664 m).   Weight as of 10/13/22: 168 lb (76.2 kg).  EKG (optional): deferred due to virtual visit  GENERAL: alert, oriented, no acute distress detected, full vision exam deferred due to pandemic and/or virtual encounter  HEENT: atraumatic, conjunttiva clear, no obvious abnormalities on inspection of external nose and ears  NECK: normal movements of the head and neck  LUNGS: on inspection no signs of respiratory distress, breathing  rate appears normal, no obvious gross SOB, gasping or wheezing  CV: no obvious cyanosis  MS: moves all visible extremities without noticeable abnormality  PSYCH/NEURO: pleasant and cooperative, no obvious depression or anxiety, speech and thought processing grossly intact, Cognitive function grossly intact  [employer] Visit from 02/02/2023 in [place] [facility] [place]  PHQ-9  Total Score 0           02/02/2023    9:19 AM 03/02/2022    8:27 AM 01/26/2022   12:56 PM 01/19/2022    9:00 AM 01/06/2021    9:54 AM  Depression screen PHQ 2/9  Decreased Interest 0 0 0 0 0  Down, Depressed, Hopeless 0 0 0 0 0  PHQ - 2 Score 0 0 0 0 0  Altered sleeping 0 1 1    Tired, decreased energy 0 1 0    Change in appetite 0 0 0    Feeling bad or failure about yourself  0 0 0    Trouble concentrating 0 0 0    Moving slowly or fidgety/restless 0 0 0    Suicidal thoughts 0 0 0    PHQ-9 Score 0 2 1    Difficult doing work/chores  Not difficult at all          01/18/2022    4:22 PM 01/19/2022    9:00 AM 03/02/2022    8:28 AM 02/01/2023    4:33 PM 02/02/2023    9:19 AM  Fall Risk  Falls in the past year? 0 0 0 0 0  Was there an injury with Fall? 0  0  0  Fall Risk Category Calculator 0  0  0  Fall Risk Category (Retired) Low  Low    (RETIRED) Patient Fall Risk Level  Low fall risk Low fall risk    Patient at Risk for Falls Due to  Medication side effect No Fall Risks  No Fall Risks  Fall risk Follow up  Falls evaluation completed;Education provided;Falls prevention discussed Falls evaluation completed  Falls evaluation completed     SUMMARY AND PLAN:  Encounter for Medicare annual wellness exam   Discussed applicable health maintenance/preventive health measures and advised and referred or ordered per patient preferences:  Health Maintenance  Topic Date Due   COVID-19 Vaccine (5 - 2023-24 season) 02/18/2023 (Originally 07/10/2022) - knows can get at pharmacy if she  wishes to get it.    MAMMOGRAM  12/17/2023   Medicare Annual Wellness (AWV)  02/02/2024   COLONOSCOPY (Pts 45-51yrs Insurance coverage will need to be confirmed)  06/20/2027   DTaP/Tdap/Td (3 - Td or Tdap) 07/16/2029   Pneumonia Vaccine 39+ Years old  Completed   INFLUENZA VACCINE  Completed   DEXA SCAN  Completed in 2021, discussed bone health, offered repeat dexa, she declined at this time. Advised wt bearing exercise, adequate dietary calcium intake, Vit D   Hepatitis C Screening  Completed   Zoster Vaccines- Shingrix  Completed   HPV VACCINES  Aged [employer] and counseling on the following was provided based on the above review of health and a plan/checklist for the patient, along with additional information discussed, was provided for the patient in the patient instructions :  -reviewed healthy diet and encouraged to continue, summary of whole foods based healthy diet shared in patient instructions -discussed exercise guidelines, encourage including some strength building exercises, info shared in patient instructions -Advised and counseled on alcohol use, limits for women  Follow up: see patient instructions     Patient Instructions  I really enjoyed getting to talk with you today! I am available on Tuesdays and Thursdays for virtual visits if you have any questions or concerns, or if I can be of any further assistance.   CHECKLIST FROM ANNUAL WELLNESS VISIT:  -Follow up (please call to  schedule if not scheduled after visit):   -yearly for annual wellness visit with primary care office  Here is a list of your preventive care/health maintenance measures and the plan for each if any are due:  Health Maintenance  Topic Date Due   COVID-19 Vaccine (5 - 2023-24 season) 02/18/2023 (Originally 07/10/2022), can get booster at pharmacy if you wish   MAMMOGRAM  12/17/2023   Medicare Annual Wellness ([place])  02/02/2024   COLONOSCOPY (Pts 45-24yrs Insurance coverage will need to  be confirmed)  06/20/2027   DTaP/Tdap/Td (3 - Td or Tdap) 07/16/2029   Pneumonia Vaccine 25+ Years old  Completed   INFLUENZA VACCINE  Completed   DEXA SCAN  Completed in  2021, let us know if you wish to repeat   Hepatitis C Screening  Completed   Zoster Vaccines- Shingrix  Completed   HPV VACCINES  Aged Out    -See a dentist at least yearly  -Get your eyes checked and then per your eye specialist's recommendations  -Other issues addressed today:  -I have included below further information regarding a healthy whole foods based diet, physical activity guidelines for adults, stress management and opportunities for social connections. I hope you find this information useful.   -----------------------------------------------------------------------------------------------------------------------------------------------------------------------------------------------------------------------------------------------------------  NUTRITION: -eat real food: lots of colorful vegetables (half the plate) and fruits -5-7 servings of vegetables and fruits per day (fresh or steamed is best), exp. 2 servings of vegetables with lunch and dinner and 2 servings of fruit per day. Berries and greens such as kale and collards are great choices.  -consume on a regular basis: whole grains (make sure first ingredient on label contains the word "whole"), fresh fruits, fish, nuts, seeds, healthy oils (such as olive oil, avocado oil, grape seed oil) -may eat small amounts of dairy and lean meat on occasion, but avoid processed meats such as ham, bacon, lunch meat, etc. -drink water -try to avoid fast food and pre-packaged foods, processed meat -most experts advise limiting sodium to < 2300mg  per day, should limit further is any chronic conditions such as high blood pressure, heart disease, diabetes, etc. The American Heart Association advised that < 1500mg  is is ideal -try to avoid foods that contain any  ingredients with names you do not recognize  -try to avoid sugar/sweets (except for the natural sugar that occurs in fresh fruit) -try to avoid sweet drinks -try to avoid white rice, white bread, pasta (unless whole grain), white or yellow potatoes  EXERCISE GUIDELINES FOR ADULTS: -if you wish to increase your physical activity, do so gradually and with the approval of your doctor -STOP and seek medical care immediately if you have any chest pain, chest discomfort or trouble breathing when starting or increasing exercise  -move and stretch your body, legs, feet and arms when sitting for long periods -Physical activity guidelines for optimal health in adults: -least 150 minutes per week of aerobic exercise (can talk, but not sing) once approved by your doctor, 20-30 minutes of sustained activity or two 10 minute episodes of sustained activity every day.  -resistance training at least 2 days per week if approved by your doctor -balance exercises 3+ days per week:   Stand somewhere where you have something sturdy to hold onto if you lose balance.    1) lift up on toes, start with 5x per day and work up to 20x   2) stand and lift on leg straight out to the side so that foot is a few inches of  the floor, start with 5x each side and work up to 20x each side   3) stand on one foot, start with 5 seconds each side and work up to 20 seconds on each side  If you need ideas or help with getting more active:  -Silver sneakers https://tools.silversneakers.com  -Walk with a Doc: http://stephens-thompson.biz/  -try to include resistance (weight lifting/strength building) and balance exercises twice per week: or the following link for ideas: ChessContest.fr  [email or web address].cy  STRESS MANAGEMENT: -can try meditating, or just sitting quietly with deep breathing while intentionally relaxing all parts of your body  for 5 minutes daily -if you need further help with stress, anxiety or depression please follow up with your primary doctor or contact the wonderful folks at [place]: [place]: -options in [place] if you wish to engage in more social and exercise related activities:  -Silver sneakers https://tools.silversneakers.com  -Walk with a Doc: http://stephens-thompson.biz/  -Check out the [place] 50+ section on the [place] of [employer] (hiking clubs, book clubs, cards and games, chess, exercise classes, aquatic classes and much more) - see the website for details: https://www.[place].gov/departments/parks-recreation/active-adults50  -YouTube has lots of exercise videos for different ages and abilities as well  -[place] (a variety of indoor and outdoor inperson activities for adults). 873-082-6255. [address]  -Virtual Online Classes (a variety of topics): see seniorplanet.org or call [phone]  -consider volunteering at a school, hospice center, church, senior center or elsewhere           Lucretia Kern, DO

## 2023-02-02 NOTE — Patient Instructions (Signed)
I really enjoyed getting to talk with you today! I am available on Tuesdays and Thursdays for virtual visits if you have any questions or concerns, or if I can be of any further assistance.   CHECKLIST FROM ANNUAL WELLNESS VISIT:  -Follow up (please call to schedule if not scheduled after visit):   -yearly for annual wellness visit with primary care office  Here is a list of your preventive care/health maintenance measures and the plan for each if any are due:  Health Maintenance  Topic Date Due   COVID-19 Vaccine (5 - 2023-24 season) 02/18/2023 (Originally 07/10/2022), can get booster at pharmacy if you wish   MAMMOGRAM  12/17/2023   Medicare Annual Wellness ([place])  02/02/2024   COLONOSCOPY (Pts 45-90yrs Insurance coverage will need to be confirmed)  06/20/2027   DTaP/Tdap/Td (3 - Td or Tdap) 07/16/2029   Pneumonia Vaccine 31+ Years old  Completed   INFLUENZA VACCINE  Completed   DEXA SCAN  Completed in  2021, let us know if you wish to repeat   Hepatitis C Screening  Completed   Zoster Vaccines- Shingrix  Completed   HPV VACCINES  Aged Out    -See a dentist at least yearly  -Get your eyes checked and then per your eye specialist's recommendations  -Other issues addressed today:  -I have included below further information regarding a healthy whole foods based diet, physical activity guidelines for adults, stress management and opportunities for social connections. I hope you find this information useful.   -----------------------------------------------------------------------------------------------------------------------------------------------------------------------------------------------------------------------------------------------------------  NUTRITION: -eat real food: lots of colorful vegetables (half the plate) and fruits -5-7 servings of vegetables and fruits per day (fresh or steamed is best), exp. 2 servings of vegetables with lunch and dinner and 2 servings of  fruit per day. Berries and greens such as kale and collards are great choices.  -consume on a regular basis: whole grains (make sure first ingredient on label contains the word "whole"), fresh fruits, fish, nuts, seeds, healthy oils (such as olive oil, avocado oil, grape seed oil) -may eat small amounts of dairy and lean meat on occasion, but avoid processed meats such as ham, bacon, lunch meat, etc. -drink water -try to avoid fast food and pre-packaged foods, processed meat -most experts advise limiting sodium to < 2300mg  per day, should limit further is any chronic conditions such as high blood pressure, heart disease, diabetes, etc. The American Heart Association advised that < 1500mg  is is ideal -try to avoid foods that contain any ingredients with names you do not recognize  -try to avoid sugar/sweets (except for the natural sugar that occurs in fresh fruit) -try to avoid sweet drinks -try to avoid white rice, white bread, pasta (unless whole grain), white or yellow potatoes  EXERCISE GUIDELINES FOR ADULTS: -if you wish to increase your physical activity, do so gradually and with the approval of your doctor -STOP and seek medical care immediately if you have any chest pain, chest discomfort or trouble breathing when starting or increasing exercise  -move and stretch your body, legs, feet and arms when sitting for long periods -Physical activity guidelines for optimal health in adults: -least 150 minutes per week of aerobic exercise (can talk, but not sing) once approved by your doctor, 20-30 minutes of sustained activity or two 10 minute episodes of sustained activity every day.  -resistance training at least 2 days per week if approved by your doctor -balance exercises 3+ days per week:   Stand somewhere where you have something  sturdy to hold onto if you lose balance.    1) lift up on toes, start with 5x per day and work up to 20x   2) stand and lift on leg straight out to the side so  that foot is a few inches of the floor, start with 5x each side and work up to 20x each side   3) stand on one foot, start with 5 seconds each side and work up to 20 seconds on each side  If you need ideas or help with getting more active:  -Silver sneakers https://tools.silversneakers.com  -Walk with a Doc: http://stephens-thompson.biz/  -try to include resistance (weight lifting/strength building) and balance exercises twice per week: or the following link for ideas: ChessContest.fr  UpdateClothing.com.cy  STRESS MANAGEMENT: -can try meditating, or just sitting quietly with deep breathing while intentionally relaxing all parts of your body for 5 minutes daily -if you need further help with stress, anxiety or depression please follow up with your primary doctor or contact the wonderful folks at [place]: [place]: -options in [place] if you wish to engage in more social and exercise related activities:  -Silver sneakers https://tools.silversneakers.com  -Walk with a Doc: http://stephens-thompson.biz/  -Check out the [place] 50+ section on the [place] of [employer] (hiking clubs, book clubs, cards and games, chess, exercise classes, aquatic classes and much more) - see the website for details: https://www.[place].gov/departments/parks-recreation/active-adults50  -YouTube has lots of exercise videos for different ages and abilities as well  -[place] (a variety of indoor and outdoor inperson activities for adults). [phone]. [address]  -Virtual Online Classes (a variety of topics): see seniorplanet.org or call ([phone]  -consider volunteering at a school, hospice center, church, senior center or elsewhere

## 2023-02-11 DIAGNOSIS — Z85828 Personal history of other malignant neoplasm of skin: Secondary | ICD-10-CM | POA: Diagnosis not present

## 2023-02-11 DIAGNOSIS — L821 Other seborrheic keratosis: Secondary | ICD-10-CM | POA: Diagnosis not present

## 2023-02-11 DIAGNOSIS — D2271 Melanocytic nevi of right lower limb, including hip: Secondary | ICD-10-CM | POA: Diagnosis not present

## 2023-02-11 DIAGNOSIS — D2272 Melanocytic nevi of left lower limb, including hip: Secondary | ICD-10-CM | POA: Diagnosis not present

## 2023-02-24 DIAGNOSIS — H43393 Other vitreous opacities, bilateral: Secondary | ICD-10-CM | POA: Diagnosis not present

## 2023-02-24 DIAGNOSIS — H26492 Other secondary cataract, left eye: Secondary | ICD-10-CM | POA: Diagnosis not present

## 2023-02-24 DIAGNOSIS — Z961 Presence of intraocular lens: Secondary | ICD-10-CM | POA: Diagnosis not present

## 2023-04-19 DIAGNOSIS — H26491 Other secondary cataract, right eye: Secondary | ICD-10-CM | POA: Diagnosis not present

## 2023-04-28 DIAGNOSIS — Z961 Presence of intraocular lens: Secondary | ICD-10-CM | POA: Diagnosis not present

## 2023-05-19 ENCOUNTER — Other Ambulatory Visit: Payer: Self-pay | Admitting: Family Medicine

## 2023-05-19 DIAGNOSIS — F411 Generalized anxiety disorder: Secondary | ICD-10-CM

## 2023-05-19 DIAGNOSIS — J309 Allergic rhinitis, unspecified: Secondary | ICD-10-CM

## 2023-06-09 ENCOUNTER — Encounter (INDEPENDENT_AMBULATORY_CARE_PROVIDER_SITE_OTHER): Payer: Self-pay

## 2023-06-22 DIAGNOSIS — H43391 Other vitreous opacities, right eye: Secondary | ICD-10-CM | POA: Diagnosis not present

## 2023-07-01 ENCOUNTER — Encounter: Payer: Self-pay | Admitting: Family Medicine

## 2023-07-01 ENCOUNTER — Ambulatory Visit (INDEPENDENT_AMBULATORY_CARE_PROVIDER_SITE_OTHER): Payer: Medicare Other | Admitting: Family Medicine

## 2023-07-01 VITALS — BP 140/80 | HR 90 | Temp 98.2°F | Ht 65.5 in | Wt 174.2 lb

## 2023-07-01 DIAGNOSIS — R739 Hyperglycemia, unspecified: Secondary | ICD-10-CM | POA: Diagnosis not present

## 2023-07-01 DIAGNOSIS — Z780 Asymptomatic menopausal state: Secondary | ICD-10-CM | POA: Diagnosis not present

## 2023-07-01 DIAGNOSIS — R011 Cardiac murmur, unspecified: Secondary | ICD-10-CM

## 2023-07-01 DIAGNOSIS — M62838 Other muscle spasm: Secondary | ICD-10-CM | POA: Diagnosis not present

## 2023-07-01 DIAGNOSIS — Z0000 Encounter for general adult medical examination without abnormal findings: Secondary | ICD-10-CM | POA: Diagnosis not present

## 2023-07-01 DIAGNOSIS — E782 Mixed hyperlipidemia: Secondary | ICD-10-CM

## 2023-07-01 LAB — LIPID PANEL
Cholesterol: 242 mg/dL — ABNORMAL HIGH (ref 0–200)
HDL: 63.7 mg/dL (ref >39.00)
NonHDL: 178.33
Total CHOL/HDL Ratio: 4
Triglycerides: 240 mg/dL — ABNORMAL HIGH (ref 0.0–149.0)
VLDL: 48 mg/dL — ABNORMAL HIGH (ref 0.0–40.0)

## 2023-07-01 LAB — CBC WITH DIFFERENTIAL/PLATELET
Basophils Absolute: 0 10*3/uL (ref 0.0–0.1)
Basophils Relative: 0.6 % (ref 0.0–3.0)
Eosinophils Absolute: 0.2 10*3/uL (ref 0.0–0.7)
Eosinophils Relative: 2.7 % (ref 0.0–5.0)
HCT: 40.8 % (ref 36.0–46.0)
Hemoglobin: 13.5 g/dL (ref 12.0–15.0)
Lymphocytes Relative: 40.2 % (ref 12.0–46.0)
Lymphs Abs: 3 10*3/uL (ref 0.7–4.0)
MCHC: 33.1 g/dL (ref 30.0–36.0)
MCV: 95.8 fl (ref 78.0–100.0)
Monocytes Absolute: 0.6 10*3/uL (ref 0.1–1.0)
Monocytes Relative: 8.3 % (ref 3.0–12.0)
Neutro Abs: 3.6 10*3/uL (ref 1.4–7.7)
Neutrophils Relative %: 48.2 % (ref 43.0–77.0)
Platelets: 375 10*3/uL (ref 150.0–400.0)
RBC: 4.26 Mil/uL (ref 3.87–5.11)
RDW: 13.2 % (ref 11.5–15.5)
WBC: 7.5 10*3/uL (ref 4.0–10.5)

## 2023-07-01 LAB — HEMOGLOBIN A1C: Hgb A1c MFr Bld: 6.3 % (ref 4.6–6.5)

## 2023-07-01 LAB — COMPREHENSIVE METABOLIC PANEL
ALT: 19 U/L (ref 0–35)
AST: 22 U/L (ref 0–37)
Albumin: 4.5 g/dL (ref 3.5–5.2)
Alkaline Phosphatase: 52 U/L (ref 39–117)
BUN: 16 mg/dL (ref 6–23)
CO2: 29 mEq/L (ref 19–32)
Calcium: 9.8 mg/dL (ref 8.4–10.5)
Chloride: 98 mEq/L (ref 96–112)
Creatinine, Ser: 0.8 mg/dL (ref 0.40–1.20)
GFR: 74.57 mL/min (ref >60.00)
Glucose, Bld: 104 mg/dL — ABNORMAL HIGH (ref 70–99)
Potassium: 4.5 mEq/L (ref 3.5–5.1)
Sodium: 137 mEq/L (ref 135–145)
Total Bilirubin: 0.5 mg/dL (ref 0.2–1.2)
Total Protein: 7.8 g/dL (ref 6.0–8.3)

## 2023-07-01 LAB — LDL CHOLESTEROL, DIRECT: Direct LDL: 158 mg/dL

## 2023-07-01 MED ORDER — CYCLOBENZAPRINE HCL 5 MG PO TABS: 5.0000 mg | ORAL_TABLET | Freq: Every day | ORAL | 0 refills | Status: DC

## 2023-07-01 NOTE — Patient Instructions (Signed)

## 2023-07-01 NOTE — Progress Notes (Signed)
Complete physical exam  Patient: Ellen Collins   DOB: 1951/08/30   72 y.o. Female  MRN: [id number]  Subjective:    Chief Complaint  Patient presents with   Annual Exam    Ellen Collins is a 72 y.o. female who presents today for a complete physical exam. She reports consuming a general diet. Home exercise routine includes walking daily, about 0.5 hrs per day. She generally feels well. She reports sleeping uses PRN ambien and xanax as needed. She does not have additional problems to discuss today.    Most recent fall risk assessment:    07/01/2023    9:00 AM  Fall Risk   Falls in the past year? 0  Number falls in past yr: 0  Injury with Fall? 0  Risk for fall due to : No Fall Risks  Follow up Falls evaluation completed     Most recent depression screenings:    07/01/2023    9:02 AM 02/02/2023    9:19 AM  PHQ 2/9 Scores  PHQ - 2 Score 0 0  PHQ- 9 Score 2 0    Vision:patient is seeing eye doctor regularly-- has had some issues with lens replacement and is having a procedure done for floaters and Dental: No current dental problems and Receives regular dental care  Patient Active Problem List   Diagnosis Date Noted   Estrogen deficiency 10/13/2022   Basal cell carcinoma 10/24/2018   Mixed hyperlipidemia 11/13/2016   Insomnia 11/13/2016   Lumbar disc disease with radiculopathy 08/14/2014   Back pain 04/12/2013   NECK PAIN 01/09/2010   PANIC DISORDER 07/19/2009   DYSPHAGIA 06/17/2009   Anxiety state 11/16/2008   Allergic rhinitis 11/16/2008      Patient Care Team: Karie Georges, MD as PCP - General (Family Medicine) [place], Amy, MD as Consulting Physician (Dermatology) Sherian Rein, MD as Consulting Physician (Obstetrics and Gynecology) Verner Chol, [facility] (Inactive) as Pharmacist (Pharmacist)   Outpatient Medications Prior to Visit  Medication Sig   ALPRAZolam (XANAX) 0.5 MG tablet TAKE ONE-HALF TABLET BY MOUTH  DAILY AS NEEDED   betamethasone  valerate lotion (VALISONE) 0.1 % Apply 1 application. topically 2 (two) times daily. (Patient taking differently: Apply 1 application  topically as needed.)   estradiol (ESTRACE) 1 MG tablet Take 1 tablet (1 mg total) by mouth daily.   famotidine (PEPCID) 20 MG tablet Take 20 mg by mouth daily.   fluticasone (FLONASE) 50 MCG/ACT nasal spray USE 2 SPRAYS IN BOTH NOSTRILS  DAILY   loratadine (CLARITIN) 10 MG tablet Take 5 mg by mouth daily. allergies   Multiple Vitamins-Minerals (MULTIVITAMIN WITH MINERALS) tablet Take 1 tablet by mouth daily.   NON FORMULARY Viviscal hair vitamin-Take one daily   Probiotic Product (PROBIOTIC PO) Take by mouth daily.   traZODone (DESYREL) 50 MG tablet 1 tablet daily at bedtime as needed.   Turmeric 500 MG TABS Take 500 mg by mouth daily.   vitamin C (ASCORBIC ACID) 500 MG tablet Take 500 mg by mouth daily.   zolpidem (AMBIEN) 5 MG tablet TAKE 1 TABLET BY MOUTH AT  BEDTIME AS NEEDED FOR SLEEP   [DISCONTINUED] azelastine (ASTELIN) 0.1 % nasal spray Place 2 sprays into both nostrils 2 (two) times daily. Use in each nostril as directed    Review of Systems  HENT:  Negative for hearing loss.   Eyes:  Negative for blurred vision.  Respiratory:  Negative for shortness of breath.   Cardiovascular:  Negative for  chest pain.  Gastrointestinal: Negative.   Genitourinary: Negative.   Musculoskeletal:  Negative for back pain.  Neurological:  Negative for headaches.  Psychiatric/Behavioral:  Negative for depression.   All other systems reviewed and are negative.      Objective:     BP (!) 140/80 (BP Location: Right Arm, Patient Position: Sitting, Cuff Size: Normal)   Pulse 90   Temp 98.2 F (36.8 C) (Oral)   Ht 5' 5.5" (1.664 m)   Wt 174 lb 3.2 oz (79 kg)   SpO2 98%   BMI 28.55 kg/m    Physical Exam Vitals reviewed.  Constitutional:      Appearance: Normal appearance. She is well-groomed and normal weight.  HENT:     Right Ear: Tympanic membrane and  ear canal normal.     Left Ear: Tympanic membrane and ear canal normal.     Mouth/Throat:     Mouth: Mucous membranes are moist.     Pharynx: No posterior oropharyngeal erythema.  Eyes:     Conjunctiva/sclera: Conjunctivae normal.  Neck:     Thyroid: No thyromegaly.  Cardiovascular:     Rate and Rhythm: Normal rate and regular rhythm.     Pulses: Normal pulses.     Heart sounds: S1 normal and S2 normal. Murmur heard.     Crescendo systolic murmur is present with a grade of 3/6.  Pulmonary:     Effort: Pulmonary effort is normal.     Breath sounds: Normal breath sounds and air entry.  Abdominal:     General: Abdomen is flat. Bowel sounds are normal.     Palpations: Abdomen is soft.  Musculoskeletal:     Right lower leg: No edema.     Left lower leg: No edema.  Lymphadenopathy:     Cervical: No cervical adenopathy.  Neurological:     Mental Status: She is alert and oriented to person, place, and time. Mental status is at baseline.     Gait: Gait is intact.  Psychiatric:        Mood and Affect: Mood and affect normal.        Speech: Speech normal.        Behavior: Behavior normal.        Judgment: Judgment normal.      No results found for any visits on 07/01/23.     Assessment & Plan:    Routine Health Maintenance and Physical Exam  Immunization History  Administered Date(s) Administered   Influenza Split 08/02/2012   Influenza, High Dose Seasonal PF 07/29/2018   Influenza,inj,Quad PF,6+ Mos 07/20/2013, 08/13/2014, 08/16/2015, 08/12/2016, 08/10/2017   Influenza-Unspecified 07/08/2019, 07/19/2020, 08/01/2021, 07/23/2022   PFIZER(Purple Top)SARS-COV-2 Vaccination 12/14/2019, 01/08/2020, 08/05/2020   PNEUMOCOCCAL CONJUGATE-20 12/09/2021   Pfizer Covid-19 Vaccine Bivalent Booster 75yrs & up 08/01/2021   Pneumococcal Polysaccharide-23 07/08/2019   Td 11/09/2005   Tdap 07/17/2019   Zoster Recombinant(Shingrix) 04/29/2020, 07/19/2020   Zoster, Live 04/17/2013     Health Maintenance  Topic Date Due   INFLUENZA VACCINE  06/10/2023   COVID-19 Vaccine (5 - 2023-24 season) 07/17/2023 (Originally 07/10/2022)   MAMMOGRAM  12/17/2023   Medicare Annual Wellness (AWV)  02/02/2024   Colonoscopy  06/20/2027   DTaP/Tdap/Td (3 - Td or Tdap) 07/16/2029   Pneumonia Vaccine 14+ Years old  Completed   DEXA SCAN  Completed   Hepatitis C Screening  Completed   Zoster Vaccines- Shingrix  Completed   HPV VACCINES  Aged Out    Discussed health benefits of  physical activity, and encouraged her to engage in regular exercise appropriate for her age and condition.  Mixed hyperlipidemia  Hyperglycemia -     Lipid panel; Future -     Hemoglobin A1c  Routine general medical examination at a health care facility -     CBC with Differential/Platelet -     Comprehensive metabolic panel  Postmenopausal state -     DG Bone Density; Future  Muscle spasm of right shoulder -     Cyclobenzaprine HCl; Take 1 tablet (5 mg total) by mouth at bedtime.  Dispense: 30 tablet; Refill: 0  Cardiac murmur -     ECHOCARDIOGRAM COMPLETE; Future  Normal physical exam findings except for the systolic murmur. I looked back in her records and did not see an ECHO for evaluation. Will send for ECHO to be done to look for valvular dysfunction. It is currently asymptomatic. Handouts given on healthy eating and exercise.   Return in 6 months (on 01/01/2024).     Karie Georges, MD

## 2023-07-07 DIAGNOSIS — H43391 Other vitreous opacities, right eye: Secondary | ICD-10-CM | POA: Diagnosis not present

## 2023-07-20 ENCOUNTER — Encounter: Payer: Self-pay | Admitting: Family Medicine

## 2023-07-20 ENCOUNTER — Other Ambulatory Visit: Payer: Self-pay | Admitting: Family Medicine

## 2023-07-20 DIAGNOSIS — E782 Mixed hyperlipidemia: Secondary | ICD-10-CM

## 2023-07-20 DIAGNOSIS — G4700 Insomnia, unspecified: Secondary | ICD-10-CM

## 2023-07-20 MED ORDER — ATORVASTATIN CALCIUM 20 MG PO TABS: 20.0000 mg | ORAL_TABLET | Freq: Every day | ORAL | 0 refills | Status: DC

## 2023-07-26 ENCOUNTER — Ambulatory Visit (INDEPENDENT_AMBULATORY_CARE_PROVIDER_SITE_OTHER): Payer: Medicare Other

## 2023-07-26 DIAGNOSIS — R011 Cardiac murmur, unspecified: Secondary | ICD-10-CM | POA: Diagnosis not present

## 2023-07-26 LAB — ECHOCARDIOGRAM COMPLETE
Area-P 1/2: 5.27 cm2
MV M vel: 3.32 m/s
MV Peak grad: 44.1 mmHg
S' Lateral: 2.79 cm

## 2023-07-27 DIAGNOSIS — H33311 Horseshoe tear of retina without detachment, right eye: Secondary | ICD-10-CM | POA: Diagnosis not present

## 2023-07-27 DIAGNOSIS — H43391 Other vitreous opacities, right eye: Secondary | ICD-10-CM | POA: Diagnosis not present

## 2023-08-04 DIAGNOSIS — H43391 Other vitreous opacities, right eye: Secondary | ICD-10-CM | POA: Diagnosis not present

## 2023-08-05 ENCOUNTER — Other Ambulatory Visit: Payer: Self-pay | Admitting: Family Medicine

## 2023-08-05 DIAGNOSIS — E2839 Other primary ovarian failure: Secondary | ICD-10-CM

## 2023-09-03 DIAGNOSIS — H43391 Other vitreous opacities, right eye: Secondary | ICD-10-CM | POA: Diagnosis not present

## 2023-09-21 DIAGNOSIS — Z780 Asymptomatic menopausal state: Secondary | ICD-10-CM | POA: Diagnosis not present

## 2023-09-21 DIAGNOSIS — Z8262 Family history of osteoporosis: Secondary | ICD-10-CM | POA: Diagnosis not present

## 2023-09-21 LAB — HM DEXA SCAN: HM Dexa Scan: NORMAL

## 2023-09-22 ENCOUNTER — Encounter: Payer: Self-pay | Admitting: Family Medicine

## 2023-09-22 DIAGNOSIS — H43393 Other vitreous opacities, bilateral: Secondary | ICD-10-CM | POA: Diagnosis not present

## 2023-09-22 DIAGNOSIS — Z961 Presence of intraocular lens: Secondary | ICD-10-CM | POA: Diagnosis not present

## 2023-09-22 DIAGNOSIS — H5211 Myopia, right eye: Secondary | ICD-10-CM | POA: Diagnosis not present

## 2023-10-16 ENCOUNTER — Other Ambulatory Visit: Payer: Self-pay | Admitting: Family Medicine

## 2023-10-16 DIAGNOSIS — E782 Mixed hyperlipidemia: Secondary | ICD-10-CM

## 2023-10-18 NOTE — Telephone Encounter (Signed)
Pt will need appointment in February-- please have patient schedule- I will send this refill.

## 2023-10-19 ENCOUNTER — Encounter: Payer: Self-pay | Admitting: Family Medicine

## 2023-10-19 NOTE — Telephone Encounter (Signed)
Attempted to reach pt. Left a voicemail to call us back.  

## 2023-10-20 NOTE — Telephone Encounter (Signed)
Contacted pt.  Scheduled pt an office visit on 01/03/2024.

## 2023-10-26 ENCOUNTER — Telehealth: Payer: Self-pay | Admitting: Family Medicine

## 2023-10-26 NOTE — Telephone Encounter (Addendum)
FYIWhitney Post //  Housecalls with [phone]  Circulation exam performed Mild issues on right foot  More concerned with BP 160s/90s x several times within an hour Pt took BP meds this morning Pt advised to F/U with PCP, if BP continues to be elevated

## 2023-10-27 NOTE — Telephone Encounter (Signed)
Please have patient continue to monitor her blood pressure daily -- if she continues to be elevated then we will need to adjust her medication.

## 2023-10-27 NOTE — Telephone Encounter (Signed)
Left a message for the patient to return my call.  

## 2023-10-28 NOTE — Telephone Encounter (Signed)
Patient informed of the message below and agreed to call back with BP readings for PCP to review.

## 2023-11-26 DIAGNOSIS — H33311 Horseshoe tear of retina without detachment, right eye: Secondary | ICD-10-CM | POA: Diagnosis not present

## 2023-12-17 ENCOUNTER — Encounter: Payer: Self-pay | Admitting: Family Medicine

## 2023-12-17 DIAGNOSIS — Z1231 Encounter for screening mammogram for malignant neoplasm of breast: Secondary | ICD-10-CM | POA: Diagnosis not present

## 2023-12-17 LAB — HM MAMMOGRAPHY

## 2023-12-22 DIAGNOSIS — H01002 Unspecified blepharitis right lower eyelid: Secondary | ICD-10-CM | POA: Diagnosis not present

## 2024-01-03 ENCOUNTER — Ambulatory Visit: Payer: Medicare Other | Admitting: Family Medicine

## 2024-01-17 ENCOUNTER — Encounter: Payer: Self-pay | Admitting: Family Medicine

## 2024-01-17 ENCOUNTER — Ambulatory Visit: Payer: Medicare Other | Admitting: Family Medicine

## 2024-01-17 VITALS — BP 140/92 | HR 94 | Temp 98.0°F | Ht 65.5 in | Wt 167.3 lb

## 2024-01-17 DIAGNOSIS — R030 Elevated blood-pressure reading, without diagnosis of hypertension: Secondary | ICD-10-CM | POA: Diagnosis not present

## 2024-01-17 DIAGNOSIS — E2839 Other primary ovarian failure: Secondary | ICD-10-CM

## 2024-01-17 DIAGNOSIS — E782 Mixed hyperlipidemia: Secondary | ICD-10-CM | POA: Diagnosis not present

## 2024-01-17 LAB — HEPATIC FUNCTION PANEL
ALT: 16 U/L (ref 0–35)
AST: 17 U/L (ref 0–37)
Albumin: 4.4 g/dL (ref 3.5–5.2)
Alkaline Phosphatase: 56 U/L (ref 39–117)
Bilirubin, Direct: 0.1 mg/dL (ref 0.0–0.3)
Total Bilirubin: 0.5 mg/dL (ref 0.2–1.2)
Total Protein: 7.2 g/dL (ref 6.0–8.3)

## 2024-01-17 LAB — LIPID PANEL
Cholesterol: 147 mg/dL (ref 0–200)
HDL: 66 mg/dL (ref >39.00)
LDL Cholesterol: 43 mg/dL (ref 0–99)
NonHDL: 80.51
Total CHOL/HDL Ratio: 2
Triglycerides: 188 mg/dL — ABNORMAL HIGH (ref 0.0–149.0)
VLDL: 37.6 mg/dL (ref 0.0–40.0)

## 2024-01-17 LAB — CK: Total CK: 176 U/L (ref 7–177)

## 2024-01-17 MED ORDER — ESTRADIOL 1 MG PO TABS: 1.0000 mg | ORAL_TABLET | Freq: Every day | ORAL | 1 refills | Status: DC

## 2024-01-17 NOTE — Progress Notes (Unsigned)
   Established Patient Office Visit  Subjective   Patient ID: Ellen Collins, female    DOB: March 03, 1951  Age: 73 y.o. MRN: [id number]  Chief Complaint  Patient presents with  . Medical Management of Chronic Issues    Pt is here for follow up today. She reports she is taking it at home and her blood pressure is normal.   Blood pressure-- pt has been checking her blood pressure at home, states that she never gets anything over 140 when she checks it at home. She reports that she did go back up to 20 mg on the lipitor and the muscle cramps resolved. She denies any headaches, no chest pain or SOB.    Current Outpatient Medications  Medication Instructions  . ALPRAZolam (XANAX) 0.5 MG tablet TAKE ONE-HALF TABLET BY MOUTH  DAILY AS NEEDED  . ascorbic acid (VITAMIN C) 500 mg, Daily  . atorvastatin (LIPITOR) 20 mg, Oral, Daily  . betamethasone valerate lotion (VALISONE) 0.1 % 1 application , Topical, 2 times daily  . estradiol (ESTRACE) 1 mg, Oral, Daily  . famotidine (PEPCID) 20 mg, Daily  . fluticasone (FLONASE) 50 MCG/ACT nasal spray USE 2 SPRAYS IN BOTH NOSTRILS  DAILY  . loratadine (CLARITIN) 5 mg, Daily  . Multiple Vitamins-Minerals (MULTIVITAMIN WITH MINERALS) tablet 1 tablet, Daily  . NON FORMULARY Viviscal hair vitamin-Take one daily  . Probiotic Product (PROBIOTIC PO) Daily  . traZODone (DESYREL) 50 MG tablet TAKE 1 TABLET BY MOUTH DAILY AT  BEDTIME AS NEEDED  . Turmeric 500 mg, Oral, Daily  . XDEMVY 0.25 % SOLN 1 drop, Daily  . zolpidem (AMBIEN) 5 mg, Oral, At bedtime PRN, for sleep    Patient Active Problem List   Diagnosis Date Noted  . Estrogen deficiency 10/13/2022  . Basal cell carcinoma 10/24/2018  . Mixed hyperlipidemia 11/13/2016  . Insomnia 11/13/2016  . Lumbar disc disease with radiculopathy 08/14/2014  . Back pain 04/12/2013  . NECK PAIN 01/09/2010  . PANIC DISORDER 07/19/2009  . DYSPHAGIA 06/17/2009  . Anxiety state 11/16/2008  . Allergic rhinitis  11/16/2008      Review of Systems  All other systems reviewed and are negative.     Objective:     BP (!) 140/92   Pulse 94   Temp 98 F (36.7 C) (Oral)   Ht 5' 5.5" (1.664 m)   Wt 167 lb 4.8 oz (75.9 kg)   SpO2 98%   BMI 27.42 kg/m  {Vitals History (Optional):23777}  Physical Exam Vitals reviewed.  Constitutional:      Appearance: Normal appearance. She is normal weight.  Cardiovascular:     Heart sounds: Normal heart sounds. No murmur heard. Neurological:     Mental Status: She is alert.     No results found for any visits on 01/17/24.  {Labs (Optional):23779}  The 10-year ASCVD risk score (Arnett DK, et al., 2019) is: 12.8%    Assessment & Plan:  Mixed hyperlipidemia -     Lipid panel; Future -     Hepatic function panel; Future -     CK; Future  Estrogen deficiency -     Estradiol; Take 1 tablet (1 mg total) by mouth daily.  Dispense: 100 tablet; Refill: 1  White coat syndrome with high blood pressure but without hypertension     Return in about 6 months (around 07/19/2024) for annual physical exam.    Karie Georges, MD

## 2024-01-17 NOTE — Patient Instructions (Addendum)
 Magnesium supplements 400 mg once a day  Beet root powder also helps to lower blood pressure   Bring in blood pressure cuff to a nursing visit and we will compare cuffs to make sure that is giving you accurate readings

## 2024-01-18 ENCOUNTER — Encounter: Payer: Self-pay | Admitting: Family Medicine

## 2024-01-18 ENCOUNTER — Other Ambulatory Visit: Payer: Self-pay | Admitting: Family Medicine

## 2024-01-18 DIAGNOSIS — E782 Mixed hyperlipidemia: Secondary | ICD-10-CM

## 2024-01-21 ENCOUNTER — Encounter: Payer: Self-pay | Admitting: Family Medicine

## 2024-01-21 NOTE — Assessment & Plan Note (Signed)
 Checking new LFT"S and lipid panel for surveillance since she is back up to 20 mg daily, checking CK also.

## 2024-01-21 NOTE — Assessment & Plan Note (Signed)
Well controlled symptoms on the estradiol 1 mg daily, will continue this for patient.

## 2024-01-24 ENCOUNTER — Ambulatory Visit: Admitting: *Deleted

## 2024-01-24 ENCOUNTER — Ambulatory Visit: Admitting: Family Medicine

## 2024-01-24 DIAGNOSIS — I10 Essential (primary) hypertension: Secondary | ICD-10-CM | POA: Diagnosis not present

## 2024-01-24 NOTE — Progress Notes (Signed)
 Patient came in as a nurse's visit for a blood pressure check.  BP reading on the patient's machine was 153/95-pulse 91.  BP reading performed by me revealed 160/100-pulse 91.  Patient was advised our office does not perform BP as a nurses visit in general, a visit is needed due to readings and an appt was scheduled to see Dr [place] at 9:30am as PCP does not have openings today.  Sent to PCP.

## 2024-01-24 NOTE — Telephone Encounter (Signed)
 Message sent to PCP as during the nurse's visit, patient stated the message sent for her results note must be for someone else as she does not "take a pill with alternating doses.

## 2024-01-25 NOTE — Progress Notes (Signed)
Sent to PCP for signature.

## 2024-01-29 ENCOUNTER — Other Ambulatory Visit: Payer: Self-pay | Admitting: Family Medicine

## 2024-01-29 DIAGNOSIS — G4700 Insomnia, unspecified: Secondary | ICD-10-CM

## 2024-02-01 ENCOUNTER — Ambulatory Visit: Admitting: Family Medicine

## 2024-02-21 DIAGNOSIS — M6748 Ganglion, other site: Secondary | ICD-10-CM | POA: Diagnosis not present

## 2024-02-21 DIAGNOSIS — D485 Neoplasm of uncertain behavior of skin: Secondary | ICD-10-CM | POA: Diagnosis not present

## 2024-02-22 DIAGNOSIS — H01001 Unspecified blepharitis right upper eyelid: Secondary | ICD-10-CM | POA: Diagnosis not present

## 2024-04-24 DIAGNOSIS — Z85828 Personal history of other malignant neoplasm of skin: Secondary | ICD-10-CM | POA: Diagnosis not present

## 2024-04-24 DIAGNOSIS — D224 Melanocytic nevi of scalp and neck: Secondary | ICD-10-CM | POA: Diagnosis not present

## 2024-04-24 DIAGNOSIS — L821 Other seborrheic keratosis: Secondary | ICD-10-CM | POA: Diagnosis not present

## 2024-04-24 DIAGNOSIS — M7130 Other bursal cyst, unspecified site: Secondary | ICD-10-CM | POA: Diagnosis not present

## 2024-04-24 DIAGNOSIS — L820 Inflamed seborrheic keratosis: Secondary | ICD-10-CM | POA: Diagnosis not present

## 2024-05-25 ENCOUNTER — Ambulatory Visit: Admitting: Family Medicine

## 2024-05-25 ENCOUNTER — Encounter: Payer: Self-pay | Admitting: Family Medicine

## 2024-05-25 VITALS — BP 136/77

## 2024-05-25 DIAGNOSIS — Z0000 Encounter for general adult medical examination without abnormal findings: Secondary | ICD-10-CM

## 2024-05-25 DIAGNOSIS — E782 Mixed hyperlipidemia: Secondary | ICD-10-CM

## 2024-05-25 MED ORDER — PRAVASTATIN SODIUM 10 MG PO TABS: 10.0000 mg | ORAL_TABLET | Freq: Every day | ORAL | 1 refills | Status: DC

## 2024-05-25 NOTE — Progress Notes (Signed)
 Subjective:   Ellen Collins is a 73 y.o. female who presents for Medicare Annual (Subsequent) preventive examination.  Visit Complete: Virtual I connected with  Ellen Collins on 05/25/24 by a video and audio enabled telemedicine application and verified that I am speaking with the correct person using two identifiers.  Patient Location: Home  Provider Location: Office/Clinic  I discussed the limitations of evaluation and management by telemedicine. The patient expressed understanding and agreed to proceed.  Vital Signs: Because this visit was a virtual/telehealth visit, some criteria may be missing or patient reported. Any vitals not documented were not able to be obtained and vitals that have been documented are patient reported.  Patient Medicare AWV questionnaire was completed by the patient on 05/25/24; I have confirmed that all information answered by patient is correct and no changes since this date.        Objective:    Today's Vitals   05/25/24 1517 05/25/24 1617  BP: 136/77   PainSc:  5    There is no height or weight on file to calculate BMI.     05/25/2024    4:10 PM 01/19/2022    9:00 AM 01/17/2022    7:47 PM 01/10/2022    6:34 PM 01/06/2021    9:53 AM 05/26/2017    7:34 PM  Advanced Directives  Does Patient Have a Medical Advance Directive? Yes Yes No No Yes No   Type of Estate agent of [place];Living will Healthcare Power of [place];Living will   Healthcare Power of [place];Living will   Does patient want to make changes to medical advance directive? No - Patient declined    No - Patient declined   Copy of Healthcare Power of Attorney in Chart? No - copy requested No - copy requested   No - copy requested   Would patient like information on creating a medical advance directive?      No - Patient declined      Data saved with a previous flowsheet row definition    Current Medications (verified) Outpatient Encounter Medications as of  05/25/2024  Medication Sig   ALPRAZolam  (XANAX ) 0.5 MG tablet TAKE ONE-HALF TABLET BY MOUTH  DAILY AS NEEDED   betamethasone  valerate lotion (VALISONE ) 0.1 % Apply 1 application. topically 2 (two) times daily. (Patient taking differently: Apply 1 application  topically as needed.)   estradiol  (ESTRACE ) 1 MG tablet Take 1 tablet (1 mg total) by mouth daily.   famotidine  (PEPCID ) 20 MG tablet Take 20 mg by mouth daily.   fluticasone  (FLONASE ) 50 MCG/ACT nasal spray USE 2 SPRAYS IN BOTH NOSTRILS  DAILY   loratadine (CLARITIN) 10 MG tablet Take 5 mg by mouth daily. allergies   Multiple Vitamins-Minerals (MULTIVITAMIN WITH MINERALS) tablet Take 1 tablet by mouth daily.   NON FORMULARY Viviscal hair vitamin-Take one daily   pravastatin  (PRAVACHOL ) 10 MG tablet Take 1 tablet (10 mg total) by mouth daily.   Probiotic Product (PROBIOTIC PO) Take by mouth daily.   traZODone  (DESYREL ) 50 MG tablet TAKE 1 TABLET BY MOUTH EVERYDAY AT BEDTIME   Turmeric 500 MG TABS Take 500 mg by mouth daily.   vitamin C (ASCORBIC ACID) 500 MG tablet Take 500 mg by mouth daily.   XDEMVY 0.25 % SOLN Place 1 drop into both eyes daily.   zolpidem  (AMBIEN ) 5 MG tablet TAKE 1 TABLET BY MOUTH AT  BEDTIME AS NEEDED FOR SLEEP   [DISCONTINUED] atorvastatin  (LIPITOR) 20 MG tablet TAKE 1 TABLET  BY MOUTH EVERY DAY   No facility-administered encounter medications on file as of 05/25/2024.    Allergies (verified) Latex   History: Past Medical History:  Diagnosis Date   Allergy    Anxiety    Arthritis    LEGS   Cancer (HCC)    SKIN   Cataract    BEGINNING   GERD (gastroesophageal reflux disease)    Low back pain radiating to both legs    worse of left side   Paroxysmal SVT (supraventricular tachycardia) (HCC)    she reports rare problems over years   Past Surgical History:  Procedure Laterality Date   broken foot     right foot/ has pins   COLONOSCOPY     UPPER GASTROINTESTINAL ENDOSCOPY     VAGINAL HYSTERECTOMY   1998   Family History  Problem Relation Age of Onset   Ovarian cancer Mother    Colon polyps Father    Colon cancer Father 84   Crohn's disease Neg Hx    Esophageal cancer Neg Hx    Rectal cancer Neg Hx    Stomach cancer Neg Hx    Social History   Socioeconomic History   Marital status: Married    Spouse name: Not on file   Number of children: Not on file   Years of education: Not on file   Highest education level: Bachelor's degree (e.g., BA, AB, BS)  Occupational History   Not on file  Tobacco Use   Smoking status: Never    Passive exposure: Never   Smokeless tobacco: Never  Vaping Use   Vaping status: Never Used  Substance and Sexual Activity   Alcohol use: Yes    Alcohol/week: 1.0 standard drink of alcohol    Types: 1 Glasses of wine per week    Comment: social   Drug use: No   Sexual activity: Not on file  Other Topics Concern   Not on file  Social History Narrative   Not on file   Social Drivers of Health   Financial Resource Strain: Low Risk  (05/24/2024)   Overall Financial Resource Strain (CARDIA)    Difficulty of Paying Living Expenses: Not hard at all  Food Insecurity: No Food Insecurity (05/24/2024)   Hunger Vital Sign    Worried About Running Out of Food in the Last Year: Never true    Ran Out of Food in the Last Year: Never true  Transportation Needs: No Transportation Needs (05/24/2024)   PRAPARE - Administrator, Civil Service (Medical): No    Lack of Transportation (Non-Medical): No  Physical Activity: Sufficiently Active (05/24/2024)   Exercise Vital Sign    Days of Exercise per Week: 5 days    Minutes of Exercise per Session: 40 min  Stress: No Stress Concern Present (05/24/2024)   [employer] of Occupational Health - Occupational Stress Questionnaire    Feeling of Stress: Only a little  Social Connections: Unknown (05/24/2024)   Social Connection and Isolation Panel    Frequency of Communication with Friends and Family: Not  on file    Frequency of Social Gatherings with Friends and Family: More than three times a week    Attends Religious Services: Patient declined    Database administrator or Organizations: No    Attends Engineer, structural: Not on file    Marital Status: Not on file    Tobacco Counseling Counseling given: Not Answered Patient is not a smoker, N/A  Clinical  Intake:  Pre-visit preparation completed: Yes  Pain : 0-10 Pain Score: 5  Pain Type: Other (Comment) (intermittent pain due to the statin medication.) Pain Location: Leg Pain Orientation: Lower, Left, Right Pain Descriptors / Indicators: Cramping Pain Onset: More than a month ago Pain Frequency: Intermittent Pain Relieving Factors: co enzyme Q10 magnesium Effect of Pain on Daily Activities: none  Pain Relieving Factors: co enzyme Q10 magnesium  BMI - recorded: 27 Nutritional Status: BMI 25 -29 Overweight Nutritional Risks: None Diabetes: No            Activities of Daily Living    05/24/2024   10:16 AM  In your present state of health, do you have any difficulty performing the following activities:  Vision? 0  Difficulty concentrating or making decisions? 0  Doing errands, shopping? 0  Preparing Food and eating ? N  Using the Toilet? N  In the past six months, have you accidently leaked urine? N  Managing your Medications? N  Managing your Finances? N  Housekeeping or managing your Housekeeping? N    Patient Care Team: Ozell Heron HERO, MD as PCP - General (Family Medicine) [place], Amy, MD as Consulting Physician (Dermatology) Danielle Rom, MD as Consulting Physician (Obstetrics and Gynecology) Liane Sharyne MATSU, [facility] (Inactive) as Pharmacist (Pharmacist)  Indicate any recent Medical Services you may have received from other than [facility] providers in the past year (date may be approximate).     Assessment:   This is a routine wellness examination for Ellen Collins.  Hearing/Vision  screen No results found.   Goals Addressed             This Visit's Progress    Exercise 150 min/wk Moderate Activity   On track      Depression Screen    05/25/2024    3:18 PM 07/01/2023    9:02 AM 02/02/2023    9:19 AM 03/02/2022    8:27 AM 01/26/2022   12:56 PM 01/19/2022    9:00 AM 01/06/2021    9:54 AM  PHQ 2/9 Scores  PHQ - 2 Score 0 0 0 0 0 0 0  PHQ- 9 Score 1 2 0 2 1      Fall Risk    05/25/2024    3:18 PM 05/24/2024   10:16 AM 07/01/2023    9:00 AM 02/02/2023    9:19 AM 02/01/2023    4:33 PM  Fall Risk   Falls in the past year? 0 1 0 0 0  Number falls in past yr: 0  0 0   Injury with Fall? 0 0 0 0   Risk for fall due to : No Fall Risks  No Fall Risks No Fall Risks   Follow up Falls evaluation completed  Falls evaluation completed Falls evaluation completed     MEDICARE RISK AT HOME: Medicare Risk at Home Any stairs in or around the home?: (Patient-Rptd) Yes If so, are there any without handrails?: (Patient-Rptd) Yes Home free of loose throw rugs in walkways, pet beds, electrical cords, etc?: (Patient-Rptd) Yes Life alert?: (Patient-Rptd) No Grab bars in the bathroom?: (Patient-Rptd) No Shower chair or bench in shower?: (Patient-Rptd) Yes Elevated toilet seat or a handicapped toilet?: (Patient-Rptd) No  TIMED UP AND GO:  Was the test performed?  No    Cognitive Function:        05/25/2024    4:12 PM  6CIT Screen  What Year? 0 points  What month? 0 points  What time? 0 points  Count back from 20 0 points  Months in reverse 0 points  Repeat phrase 0 points  Total Score 0 points    Immunizations Immunization History  Administered Date(s) Administered   Influenza Split 08/02/2012   Influenza, High Dose Seasonal PF 07/29/2018   Influenza,inj,Quad PF,6+ Mos 07/20/2013, 08/13/2014, 08/16/2015, 08/12/2016, 08/10/2017   Influenza-Unspecified 07/08/2019, 07/19/2020, 08/01/2021, 07/23/2022, 07/26/2023   PFIZER(Purple Top)SARS-COV-2 Vaccination  12/14/2019, 01/08/2020, 08/05/2020   PNEUMOCOCCAL CONJUGATE-20 12/09/2021   Pfizer Covid-19 Vaccine Bivalent Booster 22yrs & up 08/01/2021   Pneumococcal Polysaccharide-23 07/08/2019   Td 11/09/2005   Tdap 07/17/2019   Zoster Recombinant(Shingrix ) 04/29/2020, 07/19/2020   Zoster, Live 04/17/2013    TDAP status: Up to date  Flu Vaccine status: Up to date  Pneumococcal vaccine status: Up to date  Covid-19 vaccine status: Completed vaccines  Qualifies for Shingles Vaccine? Yes   Zostavax completed No   Shingrix  Completed?: Yes  Screening Tests Health Maintenance  Topic Date Due   COVID-19 Vaccine (5 - 2024-25 season) 07/11/2023   INFLUENZA VACCINE  06/09/2024   MAMMOGRAM  12/16/2024   Medicare Annual Wellness (AWV)  05/25/2025   Colonoscopy  06/20/2027   DTaP/Tdap/Td (3 - Td or Tdap) 07/16/2029   Pneumococcal Vaccine: 50+ Years  Completed   DEXA SCAN  Completed   Hepatitis C Screening  Completed   Zoster Vaccines- Shingrix   Completed   Hepatitis B Vaccines  Aged Out   HPV VACCINES  Aged Out   Meningococcal B Vaccine  Aged Out    Health Maintenance  Health Maintenance Due  Topic Date Due   COVID-19 Vaccine (5 - 2024-25 season) 07/11/2023    Colorectal cancer screening: Type of screening: Colonoscopy. Completed 2023. Repeat every 5 years  Mammogram status: Completed 2/25. Repeat every year  Bone Density status: Completed 11/24. Results reflect: Bone density results: NORMAL. Repeat every 2-4 years.  Lung Cancer Screening: (Low Dose CT Chest recommended if Age 55-80 years, 20 pack-year currently smoking OR have quit w/in 15years.) does not qualify.   Lung Cancer Screening Referral: N/A  Additional Screening:  Hepatitis C Screening: does qualify; Completed 2021  Vision Screening: Recommended annual ophthalmology exams for early detection of glaucoma and other disorders of the eye. Is the patient up to date with their annual eye exam?  Yes  Who is the provider or  what is the name of the office in which the patient attends annual eye exams? Sees Dr. [place] If pt is not established with a provider, would they like to be referred to a provider to establish care? No .   Dental Screening: Recommended annual dental exams for proper oral hygiene -- pt sees her dentist every 6 months for her regular cleanings.   Community Resource Referral / Chronic Care Management: CRR required this visit?  No   CCM required this visit?  No     Plan:     I have personally reviewed and noted the following in the patient's chart:   Medical and social history Use of alcohol, tobacco or illicit drugs  Current medications and supplements including opioid prescriptions. Patient is not currently taking opioid prescriptions. Functional ability and status Nutritional status Physical activity Advanced directives List of other physicians Hospitalizations, surgeries, and ER visits in previous 12 months Vitals Screenings to include cognitive, depression, and falls Referrals and appointments  In addition, I have reviewed and discussed with patient certain preventive protocols, quality metrics, and best practice recommendations. A written personalized care plan for preventive services as well  as general preventive health recommendations were provided to patient.     Heron CHRISTELLA Sharper, MD   05/25/2024   After Visit Summary: (MyChart) Due to this being a telephonic visit, the after visit summary with patients personalized plan was offered to patient via MyChart   Nurse Notes: N/A

## 2024-05-31 ENCOUNTER — Other Ambulatory Visit: Payer: Self-pay | Admitting: Family Medicine

## 2024-05-31 DIAGNOSIS — E2839 Other primary ovarian failure: Secondary | ICD-10-CM

## 2024-06-30 ENCOUNTER — Telehealth: Admitting: Physician Assistant

## 2024-06-30 DIAGNOSIS — J069 Acute upper respiratory infection, unspecified: Secondary | ICD-10-CM | POA: Diagnosis not present

## 2024-06-30 MED ORDER — FLUTICASONE PROPIONATE 50 MCG/ACT NA SUSP
2.0000 | Freq: Every day | NASAL | 0 refills | Status: AC
Start: 2024-06-30 — End: ?

## 2024-06-30 MED ORDER — BENZONATATE 100 MG PO CAPS: 100.0000 mg | ORAL_CAPSULE | Freq: Three times a day (TID) | ORAL | 0 refills | Status: AC

## 2024-06-30 MED ORDER — DOXYCYCLINE MONOHYDRATE 25 MG/5ML PO SUSR: 100.0000 mg | Freq: Two times a day (BID) | ORAL | 0 refills | Status: AC

## 2024-06-30 NOTE — Progress Notes (Signed)

## 2024-07-11 ENCOUNTER — Encounter: Payer: Self-pay | Admitting: Family Medicine

## 2024-07-26 DIAGNOSIS — H43392 Other vitreous opacities, left eye: Secondary | ICD-10-CM | POA: Diagnosis not present

## 2024-08-22 DIAGNOSIS — H33312 Horseshoe tear of retina without detachment, left eye: Secondary | ICD-10-CM | POA: Diagnosis not present

## 2024-08-22 DIAGNOSIS — H43392 Other vitreous opacities, left eye: Secondary | ICD-10-CM | POA: Diagnosis not present

## 2024-08-30 DIAGNOSIS — H33312 Horseshoe tear of retina without detachment, left eye: Secondary | ICD-10-CM | POA: Diagnosis not present

## 2024-09-16 ENCOUNTER — Other Ambulatory Visit: Payer: Self-pay | Admitting: Family Medicine

## 2024-09-16 DIAGNOSIS — E2839 Other primary ovarian failure: Secondary | ICD-10-CM

## 2024-09-20 ENCOUNTER — Other Ambulatory Visit: Payer: Self-pay | Admitting: Family Medicine

## 2024-09-20 DIAGNOSIS — G4700 Insomnia, unspecified: Secondary | ICD-10-CM

## 2024-10-23 ENCOUNTER — Other Ambulatory Visit: Payer: Self-pay | Admitting: Family Medicine

## 2024-10-23 DIAGNOSIS — E2839 Other primary ovarian failure: Secondary | ICD-10-CM

## 2024-10-24 ENCOUNTER — Encounter: Payer: Self-pay | Admitting: Family Medicine

## 2024-10-24 ENCOUNTER — Telehealth: Payer: Self-pay | Admitting: *Deleted

## 2024-10-24 DIAGNOSIS — E782 Mixed hyperlipidemia: Secondary | ICD-10-CM

## 2024-10-24 DIAGNOSIS — R739 Hyperglycemia, unspecified: Secondary | ICD-10-CM

## 2024-10-24 DIAGNOSIS — F411 Generalized anxiety disorder: Secondary | ICD-10-CM

## 2024-10-24 NOTE — Telephone Encounter (Signed)
 Copied from CRM #[id number]. Topic: Clinical - Request for Lab/Test Order >> Oct 24, 2024 12:55 PM Brittany M wrote: Reason for CRM: patient needing to get lab work done before physical appt 02/01/25

## 2024-10-26 ENCOUNTER — Ambulatory Visit: Payer: Self-pay

## 2024-10-26 NOTE — Telephone Encounter (Signed)
 Message from [place] S sent at 10/26/2024  9:22 AM EST  Reason for Triage: patient has a fever and flu symptoms [phone]   Reason for Disposition  General information question, no triage required and triager able to answer question  Answer Assessment - Initial Assessment Questions 1. REASON FOR CALL: What is the main reason for your call? or How can I best help you?      Spoke with patient went to [facility] and just got back, had covid test.which was negative and + flu test.  Was prescribed prednisone  for 4 days, given shot for headache. And Cortizone shot. Declined  f/u appt at this time will call back if symptoms are not improving , or worsening for follow up appointment.   2. SYMPTOMS : Do you have any symptoms?      None reported at time of call   3. OTHER QUESTIONS: Do you have any other questions?     No questions at this time plans to call and f/u if symptoms are not improving or worsening.  Protocols used: Information Only Call - No Triage-A-AH

## 2024-10-26 NOTE — Telephone Encounter (Signed)
 Attempted to call pt x1. VM left for pt. Will attempt to contact pt at a later time.   Message from [place] S sent at 10/26/2024  9:22 AM EST  Reason for Triage: patient has a fever and flu symptoms [phone]

## 2024-10-26 NOTE — Telephone Encounter (Signed)
 Attempted to call pt x2. VM left for pt. Will attempt to contact pt at a later time.   Message from [place] S sent at 10/26/2024  9:22 AM EST  Reason for Triage: patient has a fever and flu symptoms [phone]

## 2024-10-26 NOTE — Telephone Encounter (Signed)
 Noted- ok to close.

## 2024-11-08 ENCOUNTER — Other Ambulatory Visit: Payer: Self-pay | Admitting: Family Medicine

## 2024-11-08 DIAGNOSIS — E782 Mixed hyperlipidemia: Secondary | ICD-10-CM

## 2024-12-05 NOTE — Telephone Encounter (Signed)
 Orders placed, pt just needs to schedule lab appt

## 2024-12-05 NOTE — Telephone Encounter (Signed)
Left a detailed message with the information below at the patient's home number.

## 2024-12-05 NOTE — Addendum Note (Signed)
 Addended by: Mohan Erven M on: 12/05/2024 02:38 PM   Modules accepted: Orders

## 2025-02-01 ENCOUNTER — Encounter: Admitting: Family Medicine
